# Patient Record
Sex: Female | Born: 1960 | Race: Black or African American | Hispanic: No | Smoking: Never smoker
Health system: Southern US, Community
[De-identification: ages and names within clinical notes are randomized; demographics above are authoritative.]

## PROBLEM LIST (undated history)

## (undated) DIAGNOSIS — R102 Pelvic and perineal pain: Secondary | ICD-10-CM

## (undated) DIAGNOSIS — J309 Allergic rhinitis, unspecified: Secondary | ICD-10-CM

## (undated) DIAGNOSIS — C801 Malignant (primary) neoplasm, unspecified: Secondary | ICD-10-CM

## (undated) DIAGNOSIS — K219 Gastro-esophageal reflux disease without esophagitis: Secondary | ICD-10-CM

## (undated) DIAGNOSIS — N83209 Unspecified ovarian cyst, unspecified side: Secondary | ICD-10-CM

## (undated) DIAGNOSIS — Z8719 Personal history of other diseases of the digestive system: Secondary | ICD-10-CM

## (undated) DIAGNOSIS — Z973 Presence of spectacles and contact lenses: Secondary | ICD-10-CM

## (undated) DIAGNOSIS — M5432 Sciatica, left side: Secondary | ICD-10-CM

## (undated) DIAGNOSIS — G4730 Sleep apnea, unspecified: Secondary | ICD-10-CM

## (undated) DIAGNOSIS — R7303 Prediabetes: Secondary | ICD-10-CM

## (undated) DIAGNOSIS — E041 Nontoxic single thyroid nodule: Secondary | ICD-10-CM

## (undated) HISTORY — PX: ABDOMINAL HYSTERECTOMY: SHX81

## (undated) HISTORY — DX: Nontoxic single thyroid nodule: E04.1

---
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
## 1996-03-07 HISTORY — PX: LAPAROSCOPIC CHOLECYSTECTOMY: SUR755

## 1999-02-19 ENCOUNTER — Encounter: Payer: Self-pay | Admitting: Emergency Medicine

## 1999-02-19 ENCOUNTER — Emergency Department (HOSPITAL_COMMUNITY): Admission: EM | Admit: 1999-02-19 | Discharge: 1999-02-19 | Payer: Self-pay | Admitting: Emergency Medicine

## 1999-06-17 ENCOUNTER — Other Ambulatory Visit: Admission: RE | Admit: 1999-06-17 | Discharge: 1999-06-17 | Payer: Self-pay | Admitting: Internal Medicine

## 1999-08-13 ENCOUNTER — Emergency Department (HOSPITAL_COMMUNITY): Admission: EM | Admit: 1999-08-13 | Discharge: 1999-08-14 | Payer: Self-pay | Admitting: Emergency Medicine

## 2000-06-06 ENCOUNTER — Other Ambulatory Visit: Admission: RE | Admit: 2000-06-06 | Discharge: 2000-06-06 | Payer: Self-pay | Admitting: Internal Medicine

## 2001-08-22 ENCOUNTER — Other Ambulatory Visit: Admission: RE | Admit: 2001-08-22 | Discharge: 2001-08-22 | Payer: Self-pay | Admitting: Internal Medicine

## 2001-12-22 ENCOUNTER — Emergency Department (HOSPITAL_COMMUNITY): Admission: EM | Admit: 2001-12-22 | Discharge: 2001-12-22 | Payer: Self-pay | Admitting: Emergency Medicine

## 2002-01-03 ENCOUNTER — Ambulatory Visit (HOSPITAL_COMMUNITY): Admission: RE | Admit: 2002-01-03 | Discharge: 2002-01-03 | Payer: Self-pay | Admitting: Internal Medicine

## 2002-01-03 ENCOUNTER — Encounter: Payer: Self-pay | Admitting: Internal Medicine

## 2002-07-10 ENCOUNTER — Other Ambulatory Visit: Admission: RE | Admit: 2002-07-10 | Discharge: 2002-07-10 | Payer: Self-pay | Admitting: Internal Medicine

## 2003-01-09 ENCOUNTER — Encounter: Admission: RE | Admit: 2003-01-09 | Discharge: 2003-01-09 | Payer: Self-pay | Admitting: Orthopaedic Surgery

## 2003-05-09 ENCOUNTER — Encounter: Admission: RE | Admit: 2003-05-09 | Discharge: 2003-05-09 | Payer: Self-pay | Admitting: Orthopaedic Surgery

## 2003-05-13 ENCOUNTER — Ambulatory Visit (HOSPITAL_BASED_OUTPATIENT_CLINIC_OR_DEPARTMENT_OTHER): Admission: RE | Admit: 2003-05-13 | Discharge: 2003-05-13 | Payer: Self-pay | Admitting: Orthopaedic Surgery

## 2003-05-13 HISTORY — PX: OTHER SURGICAL HISTORY: SHX169

## 2003-08-25 ENCOUNTER — Encounter: Admission: RE | Admit: 2003-08-25 | Discharge: 2003-08-25 | Payer: Self-pay | Admitting: Orthopaedic Surgery

## 2003-10-08 ENCOUNTER — Emergency Department (HOSPITAL_COMMUNITY): Admission: EM | Admit: 2003-10-08 | Discharge: 2003-10-08 | Payer: Self-pay

## 2003-10-22 ENCOUNTER — Other Ambulatory Visit: Admission: RE | Admit: 2003-10-22 | Discharge: 2003-10-22 | Payer: Self-pay | Admitting: Obstetrics and Gynecology

## 2003-10-31 ENCOUNTER — Ambulatory Visit (HOSPITAL_COMMUNITY): Admission: RE | Admit: 2003-10-31 | Discharge: 2003-10-31 | Payer: Self-pay | Admitting: Obstetrics and Gynecology

## 2004-02-18 ENCOUNTER — Ambulatory Visit (HOSPITAL_COMMUNITY): Admission: RE | Admit: 2004-02-18 | Discharge: 2004-02-18 | Payer: Self-pay | Admitting: Obstetrics and Gynecology

## 2004-02-18 ENCOUNTER — Encounter (INDEPENDENT_AMBULATORY_CARE_PROVIDER_SITE_OTHER): Payer: Self-pay | Admitting: Specialist

## 2004-02-18 HISTORY — PX: OTHER SURGICAL HISTORY: SHX169

## 2004-09-13 ENCOUNTER — Ambulatory Visit: Payer: Self-pay | Admitting: Internal Medicine

## 2004-09-15 ENCOUNTER — Ambulatory Visit: Payer: Self-pay | Admitting: Internal Medicine

## 2004-09-15 ENCOUNTER — Other Ambulatory Visit: Admission: RE | Admit: 2004-09-15 | Discharge: 2004-09-15 | Payer: Self-pay | Admitting: Internal Medicine

## 2005-01-19 ENCOUNTER — Emergency Department (HOSPITAL_COMMUNITY): Admission: EM | Admit: 2005-01-19 | Discharge: 2005-01-19 | Payer: Self-pay | Admitting: Emergency Medicine

## 2005-06-07 ENCOUNTER — Ambulatory Visit: Payer: Self-pay | Admitting: Internal Medicine

## 2005-10-12 ENCOUNTER — Ambulatory Visit: Payer: Self-pay | Admitting: Internal Medicine

## 2005-10-13 ENCOUNTER — Ambulatory Visit: Payer: Self-pay | Admitting: Internal Medicine

## 2006-06-21 ENCOUNTER — Ambulatory Visit: Payer: Self-pay | Admitting: Internal Medicine

## 2006-06-21 LAB — CONVERTED CEMR LAB
ALT: 16 units/L (ref 0–40)
AST: 16 units/L (ref 0–37)
Albumin: 3.8 g/dL (ref 3.5–5.2)
Alkaline Phosphatase: 67 units/L (ref 39–117)
BUN: 7 mg/dL (ref 6–23)
Basophils Absolute: 0 10*3/uL (ref 0.0–0.1)
Basophils Relative: 0.3 % (ref 0.0–1.0)
Bilirubin Urine: NEGATIVE
Bilirubin, Direct: 0.2 mg/dL (ref 0.0–0.3)
CO2: 29 meq/L (ref 19–32)
Calcium: 9 mg/dL (ref 8.4–10.5)
Chloride: 108 meq/L (ref 96–112)
Cholesterol: 177 mg/dL (ref 0–200)
Creatinine, Ser: 0.7 mg/dL (ref 0.4–1.2)
Eosinophils Absolute: 0.2 10*3/uL (ref 0.0–0.6)
Eosinophils Relative: 3 % (ref 0.0–5.0)
GFR calc Af Amer: 117 mL/min
GFR calc non Af Amer: 97 mL/min
Glucose, Bld: 95 mg/dL (ref 70–99)
HCT: 39.4 % (ref 36.0–46.0)
HDL: 61.5 mg/dL (ref >39.0)
Hemoglobin: 13.2 g/dL (ref 12.0–15.0)
Ketones, ur: NEGATIVE mg/dL
LDL Cholesterol: 94 mg/dL (ref 0–99)
Leukocytes, UA: NEGATIVE
Lymphocytes Relative: 28.6 % (ref 12.0–46.0)
MCHC: 33.6 g/dL (ref 30.0–36.0)
MCV: 82.6 fL (ref 78.0–100.0)
Monocytes Absolute: 0.4 10*3/uL (ref 0.2–0.7)
Monocytes Relative: 7.3 % (ref 3.0–11.0)
Neutro Abs: 3.5 10*3/uL (ref 1.4–7.7)
Neutrophils Relative %: 60.8 % (ref 43.0–77.0)
Nitrite: NEGATIVE
Platelets: 283 10*3/uL (ref 150–400)
Potassium: 3.9 meq/L (ref 3.5–5.1)
RBC: 4.77 M/uL (ref 3.87–5.11)
RDW: 12.8 % (ref 11.5–14.6)
Sodium: 142 meq/L (ref 135–145)
Specific Gravity, Urine: >=1.03 (ref 1.000–1.03)
TSH: 1.05 microintl units/mL (ref 0.35–5.50)
Total Bilirubin: 1.2 mg/dL (ref 0.3–1.2)
Total CHOL/HDL Ratio: 2.9
Total Protein, Urine: NEGATIVE mg/dL
Total Protein: 6.4 g/dL (ref 6.0–8.3)
Triglycerides: 106 mg/dL (ref 0–149)
Urine Glucose: NEGATIVE mg/dL
Urobilinogen, UA: 0.2 (ref 0.0–1.0)
VLDL: 21 mg/dL (ref 0–40)
WBC: 5.8 10*3/uL (ref 4.5–10.5)
pH: 5.5 (ref 5.0–8.0)

## 2006-06-28 ENCOUNTER — Ambulatory Visit: Payer: Self-pay | Admitting: Internal Medicine

## 2006-07-04 ENCOUNTER — Encounter: Admission: RE | Admit: 2006-07-04 | Discharge: 2006-07-04 | Payer: Self-pay | Admitting: Internal Medicine

## 2007-05-09 ENCOUNTER — Telehealth: Payer: Self-pay | Admitting: Internal Medicine

## 2007-06-26 ENCOUNTER — Ambulatory Visit: Payer: Self-pay | Admitting: Internal Medicine

## 2007-06-26 LAB — CONVERTED CEMR LAB
ALT: 15 units/L (ref 0–35)
AST: 15 units/L (ref 0–37)
Albumin: 3.9 g/dL (ref 3.5–5.2)
Alkaline Phosphatase: 67 units/L (ref 39–117)
BUN: 4 mg/dL — ABNORMAL LOW (ref 6–23)
Basophils Absolute: 0 10*3/uL (ref 0.0–0.1)
Basophils Relative: 0.1 % (ref 0.0–1.0)
Bilirubin Urine: NEGATIVE
Bilirubin, Direct: 0.1 mg/dL (ref 0.0–0.3)
CO2: 30 meq/L (ref 19–32)
Calcium: 9.1 mg/dL (ref 8.4–10.5)
Chloride: 109 meq/L (ref 96–112)
Cholesterol: 202 mg/dL (ref 0–200)
Creatinine, Ser: 0.7 mg/dL (ref 0.4–1.2)
Crystals: NEGATIVE
Direct LDL: 112.8 mg/dL
Eosinophils Absolute: 0.2 10*3/uL (ref 0.0–0.7)
Eosinophils Relative: 3.1 % (ref 0.0–5.0)
GFR calc Af Amer: 117 mL/min
GFR calc non Af Amer: 97 mL/min
Glucose, Bld: 94 mg/dL (ref 70–99)
HCT: 41.6 % (ref 36.0–46.0)
HDL: 76.1 mg/dL (ref >39.0)
Hemoglobin: 13.7 g/dL (ref 12.0–15.0)
Ketones, ur: NEGATIVE mg/dL
Lymphocytes Relative: 24.4 % (ref 12.0–46.0)
MCHC: 32.9 g/dL (ref 30.0–36.0)
MCV: 84.4 fL (ref 78.0–100.0)
Monocytes Absolute: 0.4 10*3/uL (ref 0.1–1.0)
Monocytes Relative: 6.4 % (ref 3.0–12.0)
Mucus, UA: NEGATIVE
Neutro Abs: 4.1 10*3/uL (ref 1.4–7.7)
Neutrophils Relative %: 66 % (ref 43.0–77.0)
Nitrite: POSITIVE — AB
Platelets: 296 10*3/uL (ref 150–400)
Potassium: 3.9 meq/L (ref 3.5–5.1)
RBC: 4.93 M/uL (ref 3.87–5.11)
RDW: 13 % (ref 11.5–14.6)
Sodium: 141 meq/L (ref 135–145)
Specific Gravity, Urine: 1.025 (ref 1.000–1.03)
TSH: 1.38 microintl units/mL (ref 0.35–5.50)
Total Bilirubin: 1.1 mg/dL (ref 0.3–1.2)
Total CHOL/HDL Ratio: 2.7
Total Protein, Urine: NEGATIVE mg/dL
Total Protein: 6.6 g/dL (ref 6.0–8.3)
Triglycerides: 43 mg/dL (ref 0–149)
Urine Glucose: NEGATIVE mg/dL
Urobilinogen, UA: 0.2 (ref 0.0–1.0)
VLDL: 9 mg/dL (ref 0–40)
WBC: 6.2 10*3/uL (ref 4.5–10.5)
pH: 6 (ref 5.0–8.0)

## 2007-07-03 ENCOUNTER — Ambulatory Visit: Payer: Self-pay | Admitting: Internal Medicine

## 2007-07-03 DIAGNOSIS — F329 Major depressive disorder, single episode, unspecified: Secondary | ICD-10-CM

## 2007-07-03 DIAGNOSIS — J309 Allergic rhinitis, unspecified: Secondary | ICD-10-CM | POA: Insufficient documentation

## 2007-07-03 DIAGNOSIS — F411 Generalized anxiety disorder: Secondary | ICD-10-CM | POA: Insufficient documentation

## 2007-09-17 ENCOUNTER — Telehealth: Payer: Self-pay | Admitting: Internal Medicine

## 2008-02-03 ENCOUNTER — Emergency Department (HOSPITAL_COMMUNITY): Admission: EM | Admit: 2008-02-03 | Discharge: 2008-02-03 | Payer: Self-pay | Admitting: Emergency Medicine

## 2008-08-05 ENCOUNTER — Ambulatory Visit: Payer: Self-pay | Admitting: Internal Medicine

## 2008-08-05 LAB — CONVERTED CEMR LAB
ALT: 14 units/L (ref 0–35)
AST: 14 units/L (ref 0–37)
Albumin: 3.8 g/dL (ref 3.5–5.2)
Alkaline Phosphatase: 68 units/L (ref 39–117)
BUN: 8 mg/dL (ref 6–23)
Basophils Absolute: 0 10*3/uL (ref 0.0–0.1)
Basophils Relative: 0.3 % (ref 0.0–3.0)
Bilirubin Urine: NEGATIVE
Bilirubin, Direct: 0.2 mg/dL (ref 0.0–0.3)
CO2: 29 meq/L (ref 19–32)
Calcium: 9.1 mg/dL (ref 8.4–10.5)
Chloride: 109 meq/L (ref 96–112)
Cholesterol: 172 mg/dL (ref 0–200)
Creatinine, Ser: 0.7 mg/dL (ref 0.4–1.2)
Eosinophils Absolute: 0.1 10*3/uL (ref 0.0–0.7)
Eosinophils Relative: 2.1 % (ref 0.0–5.0)
GFR calc non Af Amer: 116.09 mL/min (ref >60)
Glucose, Bld: 92 mg/dL (ref 70–99)
HCT: 39.2 % (ref 36.0–46.0)
HDL: 73.6 mg/dL (ref >39.00)
Hemoglobin, Urine: NEGATIVE
Hemoglobin: 13.4 g/dL (ref 12.0–15.0)
Ketones, ur: NEGATIVE mg/dL
LDL Cholesterol: 92 mg/dL (ref 0–99)
Leukocytes, UA: NEGATIVE
Lymphocytes Relative: 33.6 % (ref 12.0–46.0)
Lymphs Abs: 1.7 10*3/uL (ref 0.7–4.0)
MCHC: 34.3 g/dL (ref 30.0–36.0)
MCV: 85.3 fL (ref 78.0–100.0)
Monocytes Absolute: 0.4 10*3/uL (ref 0.1–1.0)
Monocytes Relative: 8.5 % (ref 3.0–12.0)
Neutro Abs: 2.9 10*3/uL (ref 1.4–7.7)
Neutrophils Relative %: 55.5 % (ref 43.0–77.0)
Nitrite: POSITIVE
Platelets: 240 10*3/uL (ref 150.0–400.0)
Potassium: 4.1 meq/L (ref 3.5–5.1)
RBC: 4.59 M/uL (ref 3.87–5.11)
RDW: 13.2 % (ref 11.5–14.6)
Sodium: 142 meq/L (ref 135–145)
Specific Gravity, Urine: 1.02 (ref 1.000–1.030)
TSH: 1.26 microintl units/mL (ref 0.35–5.50)
Total Bilirubin: 1.1 mg/dL (ref 0.3–1.2)
Total CHOL/HDL Ratio: 2
Total Protein, Urine: NEGATIVE mg/dL
Total Protein: 6.7 g/dL (ref 6.0–8.3)
Triglycerides: 33 mg/dL (ref 0.0–149.0)
Urine Glucose: NEGATIVE mg/dL
Urobilinogen, UA: 0.2 (ref 0.0–1.0)
VLDL: 6.6 mg/dL (ref 0.0–40.0)
WBC: 5.1 10*3/uL (ref 4.5–10.5)
pH: 5.5 (ref 5.0–8.0)

## 2008-08-18 ENCOUNTER — Ambulatory Visit: Payer: Self-pay | Admitting: Internal Medicine

## 2008-10-30 ENCOUNTER — Telehealth: Payer: Self-pay | Admitting: Internal Medicine

## 2008-10-30 DIAGNOSIS — N62 Hypertrophy of breast: Secondary | ICD-10-CM

## 2008-10-30 DIAGNOSIS — M549 Dorsalgia, unspecified: Secondary | ICD-10-CM | POA: Insufficient documentation

## 2008-11-06 ENCOUNTER — Encounter: Payer: Self-pay | Admitting: Internal Medicine

## 2008-11-06 ENCOUNTER — Telehealth: Payer: Self-pay | Admitting: Internal Medicine

## 2008-12-11 ENCOUNTER — Telehealth: Payer: Self-pay | Admitting: Internal Medicine

## 2008-12-16 ENCOUNTER — Encounter (INDEPENDENT_AMBULATORY_CARE_PROVIDER_SITE_OTHER): Payer: Self-pay | Admitting: Plastic Surgery

## 2008-12-16 ENCOUNTER — Ambulatory Visit (HOSPITAL_BASED_OUTPATIENT_CLINIC_OR_DEPARTMENT_OTHER): Admission: RE | Admit: 2008-12-16 | Discharge: 2008-12-16 | Payer: Self-pay | Admitting: Plastic Surgery

## 2008-12-16 HISTORY — PX: BREAST REDUCTION SURGERY: SHX8

## 2009-05-26 ENCOUNTER — Ambulatory Visit: Payer: Self-pay | Admitting: Internal Medicine

## 2009-05-26 DIAGNOSIS — M25569 Pain in unspecified knee: Secondary | ICD-10-CM

## 2009-05-27 ENCOUNTER — Encounter: Payer: Self-pay | Admitting: Internal Medicine

## 2009-05-30 ENCOUNTER — Ambulatory Visit (HOSPITAL_COMMUNITY): Admission: RE | Admit: 2009-05-30 | Discharge: 2009-05-30 | Payer: Self-pay | Admitting: Internal Medicine

## 2009-06-08 ENCOUNTER — Telehealth: Payer: Self-pay | Admitting: Internal Medicine

## 2009-07-07 ENCOUNTER — Inpatient Hospital Stay (HOSPITAL_COMMUNITY): Admission: EM | Admit: 2009-07-07 | Discharge: 2009-07-10 | Payer: Self-pay | Admitting: Emergency Medicine

## 2009-07-07 ENCOUNTER — Encounter (INDEPENDENT_AMBULATORY_CARE_PROVIDER_SITE_OTHER): Payer: Self-pay | Admitting: Internal Medicine

## 2009-07-10 ENCOUNTER — Encounter: Payer: Self-pay | Admitting: Internal Medicine

## 2009-07-24 ENCOUNTER — Ambulatory Visit: Payer: Self-pay | Admitting: Pulmonary Disease

## 2009-07-27 ENCOUNTER — Encounter: Payer: Self-pay | Admitting: Pulmonary Disease

## 2009-07-27 ENCOUNTER — Ambulatory Visit (HOSPITAL_BASED_OUTPATIENT_CLINIC_OR_DEPARTMENT_OTHER): Admission: RE | Admit: 2009-07-27 | Discharge: 2009-07-27 | Payer: Self-pay | Admitting: Pulmonary Disease

## 2009-07-31 ENCOUNTER — Ambulatory Visit: Payer: Self-pay | Admitting: Pulmonary Disease

## 2009-08-19 ENCOUNTER — Ambulatory Visit: Payer: Self-pay | Admitting: Pulmonary Disease

## 2009-08-19 DIAGNOSIS — G4733 Obstructive sleep apnea (adult) (pediatric): Secondary | ICD-10-CM

## 2009-08-31 ENCOUNTER — Ambulatory Visit: Payer: Self-pay | Admitting: Internal Medicine

## 2009-08-31 DIAGNOSIS — K219 Gastro-esophageal reflux disease without esophagitis: Secondary | ICD-10-CM

## 2010-03-28 ENCOUNTER — Encounter: Payer: Self-pay | Admitting: Obstetrics and Gynecology

## 2010-04-06 NOTE — Progress Notes (Signed)
  Phone Note Refill Request  on June 08, 2009 8:46 AM  Refills Requested: Medication #1:  ALPRAZOLAM 0.5 MG  TABS 1 by mouth once daily as needed   Dosage confirmed as above?Dosage Confirmed   Notes: [facility] [address] [phone] Initial call taken by: Scharlene Gloss,  June 08, 2009 8:46 AM  Follow-up for Phone Call        Rx faxed to pharmacy Follow-up by: Margaret Pyle, CMA,  June 08, 2009 10:10 AM    New/Updated Medications: ALPRAZOLAM 0.5 MG  TABS (ALPRAZOLAM) 1 by mouth once daily as needed Prescriptions: ALPRAZOLAM 0.5 MG  TABS (ALPRAZOLAM) 1 by mouth once daily as needed  #30 x 2   Entered and Authorized by:   Corwin Levins MD   Signed by:   Corwin Levins MD on 06/08/2009   Method used:   Print then Give to Patient   RxID:   [id number]  done hardcopy to LIM side B - dahlia  Corwin Levins MD  June 08, 2009 9:03 AM

## 2010-04-06 NOTE — Assessment & Plan Note (Signed)
Summary: f/u sleep study ///kp   Visit Type:  Follow-up Copy to:  Dr. Berkley Harvey Primary Provider/Referring Provider:  Corwin Levins MD  CC:  Sleep study follow-up.Marland Kitchen  History of Present Illness: 50 yo female for sleep study follow up.   This was done on Jul 27, 2009 and showed mild OSA with AHI 2 RDI 5 with a sgnificant REM and positional affect.  She has been working on getting her weight down.  She has been getting problems with her knees, and this has limited her ability to exercise.  She is trying to adjust her diet.  She was on several weight loss therapies before, and had some success with these.  Unfortunately, the results were short lived.     Current Medications (verified): 1)  Diazepam 5 Mg Tabs (Diazepam) .... Take 1 Tablet By Mouth At Bedtime As Needed 2)  Px Enteric Aspirin 325 Mg Tbec (Aspirin) .Marland Kitchen.. 1 Tablet Every Day 3)  Herbalife Total Control .... 2 Times Daily 4)  Herbalife Thermo Bond .Marland Kitchen.. 6 Times  Day  Allergies (verified): 1)  ! * Bactim  Past History:  Past Surgical History: Last updated: 07/24/2009 Cholecystectomy s/p c-section s/p breast cyst 1980 Right elbow ulnar nerve decompression 2005 Lysis of adhesion and right ovary removal 2005 Bilateral reduction mammoplasty 2010  Past Medical History: right thyroid nodule Anxiety Depression Allergic rhinitis Mild OSA      - Jul 27, 2009 PSG RDI 5, positional and REM affect.  Vital Signs:  Patient profile:   50 year old female Height:      63 inches (160.02 cm) Weight:      189 pounds (85.91 kg) BMI:     33.60 O2 Sat:      98 % on Room air Temp:     98.2 degrees F (36.78 degrees C) oral Pulse rate:   72 / minute BP sitting:   130 / 86  (left arm) Cuff size:   large  Vitals Entered By: Michel Bickers CMA (August 19, 2009 4:35 PM)  O2 Sat at Rest %:  98 O2 Flow:  Room air CC: Sleep study follow-up. Comments Medications reviewed. Daytime phone verified. Michel Bickers CMA  August 19, 2009 4:45  PM   Physical Exam  General:  normal appearance, healthy appearing, and obese.   Nose:  no deformity, discharge, inflammation, or lesions Mouth:  MP 4, enlarged tongue, 2+ tonsills Neck:  no JVD.   Lungs:  clear bilaterally to auscultation and percussion Heart:  regular rate and rhythm, S1, S2 without murmurs, rubs, gallops, or clicks Extremities:  no clubbing, cyanosis, edema, or deformity noted Cervical Nodes:  no significant adenopathy   Impression & Recommendations:  Problem # 1:  OBSTRUCTIVE SLEEP APNEA (ICD-327.23)  She has mild sleep apnea with a positional and REM affect.  I reviewed her sleep results with her.  Explained how sleep apnea can affect her health.  Driving precautions were discussed.  Reviewed treatment options for her sleep apnea.  Advised her to try using positional therapy.  Strongly emphasized the need for improvement in her weight.  Reviewed in detail interventions to achieve weight reduction.  She wants to try pharmaceutical assistance for her weight loss.  I have advised her to discuss this further with her primary care physician.  Explained that her sleep apnea can get worse as she gets older, especially if she is unsuccessful in losing weight.  She is to monitor her symptoms, and will then decide if specific therapy  is needed for her sleep apnea.  Medications Added to Medication List This Visit: 1)  Diazepam 5 Mg Tabs (Diazepam) .... Take 1 tablet by mouth at bedtime as needed  Complete Medication List: 1)  Diazepam 5 Mg Tabs (Diazepam) .... Take 1 tablet by mouth at bedtime as needed 2)  Px Enteric Aspirin 325 Mg Tbec (Aspirin) .Marland Kitchen.. 1 tablet every day 3)  Herbalife Total Control  .... 2 times daily 4)  Herbalife Thermo Bond  .Marland Kitchen.. 6 times  day  Other Orders: Est. Patient Level III (36644)  Patient Instructions: 1)  Follow up as needed

## 2010-04-06 NOTE — Assessment & Plan Note (Signed)
Summary: R KNEE PAIN  STC   Vital Signs:  Patient profile:   50 year old female Height:      63 inches Weight:      191.13 pounds BMI:     33.98 O2 Sat:      99 % on Room air Temp:     98.1 degrees F oral Pulse rate:   67 / minute BP sitting:   110 / 66  (left arm) Cuff size:   regular  Vitals Entered ByZella Ball Ewing (May 26, 2009 4:18 PM)  O2 Flow:  Room air CC: Right knee pain, retaining fluid/RE   CC:  Right knee pain and retaining fluid/RE.  History of Present Illness: here with trying to follow better health recommendations as per past visits with trying to be more active especially with walking more;  trying to walk at least 5 times per wk but has not been able to in the past wk nearly as much as she has suffered injury it seems to right knee with pain, swelling increasing daily without fever or fall or other trauma.  Knee pain approx 8/10, worse with ambulation, large swelling and click wtih ROM laterally, and locked once or twice.  Stress levels higher with pain, but she denies worsening depressive symptoms, suicidal ideaiton, or panic.  Good tolerance and med compliance.    Problems Prior to Update: 1)  Knee Pain, Right  (ICD-719.46) 2)  Breast Hypertrophy  (ICD-611.1) 3)  Back Pain  (ICD-724.5) 4)  Preventive Health Care  (ICD-V70.0) 5)  Allergic Rhinitis  (ICD-477.9) 6)  Depression  (ICD-311) 7)  Anxiety  (ICD-300.00)  Medications Prior to Update: 1)  Alprazolam 0.5 Mg  Tabs (Alprazolam) .Marland Kitchen.. 1 By Mouth Once Daily As Needed 2)  Bupropion Hcl 300 Mg Xr24h-Tab (Bupropion Hcl) .Marland Kitchen.. 1po Once Daily  Current Medications (verified): 1)  Alprazolam 0.5 Mg  Tabs (Alprazolam) .Marland Kitchen.. 1 By Mouth Once Daily As Needed 2)  Bupropion Hcl 300 Mg Xr24h-Tab (Bupropion Hcl) .Marland Kitchen.. 1po Once Daily 3)  Oxycodone Hcl 5 Mg Caps (Oxycodone Hcl) .Marland Kitchen.. 1 - 2 By Mouth Q 6 Hrs As Needed Pain  Allergies (verified): 1)  ! * Bactim  Past History:  Past Medical History: Last updated:  07/03/2007 right thyroid nodule Anxiety Depression Allergic rhinitis  Past Surgical History: Last updated: 07/03/2007 Cholecystectomy s/p c-section s/p breast cyst 1980  Social History: Last updated: 08/18/2008 Married/separted Never Smoked Alcohol use-no 1 son 16yo work - Passenger transport manager  - [employer] - [employer]  Risk Factors: Smoking Status: never (07/03/2007)  Review of Systems       all otherwise negative per pt -    Physical Exam  General:  alert and overweight-appearing.   Head:  normocephalic and atraumatic.   Eyes:  vision grossly intact, pupils equal, and pupils round.   Ears:  R ear normal and L ear normal.   Nose:  no external deformity and no nasal discharge.   Mouth:  no gingival abnormalities and pharynx pink and moist.   Neck:  supple and no masses.   Lungs:  normal respiratory effort and normal breath sounds.   Heart:  normal rate and regular rhythm.   Msk:  right knee with 3+ effusion, decreased ROM, click on ROM testing lateraly Extremities:  no edema, no erythema except for 2+ RLE swelling below the knee without calf tender or erythema   Impression & Recommendations:  Problem # 1:  KNEE PAIN, RIGHT (ICD-719.46)  severe  with large effusion, lateral click, subjective locking, and marked sympathetic LE edema prob related = prob cartilage/meniscus tear -   for MRI right knee, and refer ortho, pain control  Her updated medication list for this problem includes:    Oxycodone Hcl 5 Mg Caps (Oxycodone hcl) .Marland Kitchen... 1 - 2 by mouth q 6 hrs as needed pain  Orders: Radiology Referral (Radiology) Orthopedic Surgeon Referral (Ortho Surgeon)  Problem # 2:  ANXIETY (ICD-300.00)  Her updated medication list for this problem includes:    Alprazolam 0.5 Mg Tabs (Alprazolam) .Marland Kitchen... 1 by mouth once daily as needed    Bupropion Hcl 300 Mg Xr24h-tab (Bupropion hcl) .Marland Kitchen... 1po once daily stable overall by hx and exam, ok to continue meds/tx as is    Complete Medication List: 1)  Alprazolam 0.5 Mg Tabs (Alprazolam) .Marland Kitchen.. 1 by mouth once daily as needed 2)  Bupropion Hcl 300 Mg Xr24h-tab (Bupropion hcl) .Marland Kitchen.. 1po once daily 3)  Oxycodone Hcl 5 Mg Caps (Oxycodone hcl) .Marland Kitchen.. 1 - 2 by mouth q 6 hrs as needed pain  Patient Instructions: 1)  Please take all new medications as prescribed 2)  Continue all previous medications as before this visit  3)  You will be contacted about the referral(s) to: MRI for the knee, and Orthopedic (Dr Thurston Hole  [facility] from [facility]) 4)  Please schedule a follow-up appointment in 4 months with CPX labs Prescriptions: OXYCODONE HCL 5 MG CAPS (OXYCODONE HCL) 1 - 2 by mouth q 6 hrs as needed pain  #100 x 0   Entered and Authorized by:   Corwin Levins MD   Signed by:   Corwin Levins MD on 05/26/2009   Method used:   Print then Give to Patient   RxID:   [id number]

## 2010-04-06 NOTE — Assessment & Plan Note (Signed)
Summary: cpx/bcbs/#/cd   Vital Signs:  Patient profile:   50 year old female Height:      62 inches Weight:      188 pounds BMI:     34.51 O2 Sat:      95 % on Room air Temp:     98.3 degrees F oral Pulse rate:   74 / minute BP sitting:   134 / 84  (left arm) Cuff size:   regular  Vitals Entered By: Zella Ball Ewing CMA Duncan Dull) (August 31, 2009 1:21 PM)  O2 Flow:  Room air  CC: Adult Physical/RE   Primary Care Provider:  Corwin Levins MD  CC:  Adult Physical/RE.  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  No new complaints, does have ongoing recurrent hoarseness and reflux for which she saw ENT previously but did not continue the recommended PPI.  No abd pain, dysphagia, wt loss, change in bowels or blood or fever  Problems Prior to Update: 1)  Gerd  (ICD-530.81) 2)  Obstructive Sleep Apnea  (ICD-327.23) 3)  Knee Pain, Right  (ICD-719.46) 4)  Breast Hypertrophy  (ICD-611.1) 5)  Back Pain  (ICD-724.5) 6)  Preventive Health Care  (ICD-V70.0) 7)  Allergic Rhinitis  (ICD-477.9) 8)  Depression  (ICD-311) 9)  Anxiety  (ICD-300.00)  Medications Prior to Update: 1)  Diazepam 5 Mg Tabs (Diazepam) .... Take 1 Tablet By Mouth At Bedtime As Needed 2)  Px Enteric Aspirin 325 Mg Tbec (Aspirin) .Marland Kitchen.. 1 Tablet Every Day 3)  Herbalife Total Control .... 2 Times Daily 4)  Herbalife Thermo Bond .Marland Kitchen.. 6 Times  Day  Current Medications (verified): 1)  Diazepam 5 Mg Tabs (Diazepam) .... Take 1 Tablet By Mouth At Bedtime As Needed 2)  Px Enteric Aspirin 325 Mg Tbec (Aspirin) .Marland Kitchen.. 1 Tablet Every Day 3)  Herbalife Total Control .... 2 Times Daily 4)  Herbalife Thermo Bond .Marland Kitchen.. 6  Times  Day 5)  Omeprazole 20 Mg Cpdr (Omeprazole) .... 2po Once Daily  Allergies (verified): 1)  ! 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 GERD   Review of Systems  The patient denies anorexia, fever, weight loss, weight gain, vision loss, decreased hearing, chest pain, syncope, dyspnea on exertion, peripheral edema, prolonged cough, headaches, hemoptysis, abdominal pain, melena, hematochezia, severe indigestion/heartburn, hematuria, muscle weakness, suspicious skin lesions, transient blindness, difficulty walking, unusual weight change, abnormal bleeding, enlarged lymph nodes, and angioedema.         all otherwise negative per pt -    Physical Exam  General:  alert and overweight-appearing.  Head:  normocephalic and atraumatic.   Eyes:  vision grossly intact, pupils equal, and pupils round.   Ears:  R ear normal and L ear normal.   Nose:  no external deformity and no nasal discharge.   Mouth:  no gingival abnormalities and pharynx pink and moist.   Neck:  supple and no masses.   Lungs:  normal respiratory effort and normal breath sounds.   Heart:  normal rate and regular rhythm.   Abdomen:  soft, non-tender, and normal bowel sounds.   Msk:  no joint tenderness and no joint swelling.   Extremities:  no edema, no  erythema  Neurologic:  cranial nerves II-XII intact and strength normal in all extremities.     Impression & Recommendations:  Problem # 1:  Preventive Health Care (ICD-V70.0) Overall doing well, age appropriate education and counseling updated and referral for appropriate preventive services done unless declined, immunizations up to date or declined, diet counseling done if overweight, urged to quit smoking if smokes , most recent labs reviewed and current ordered if appropriate, ecg reviewed or declined (interpretation per ECG scanned in the EMR if done); information regarding Medicare Prevention requirements given if appropriate; speciality referrals updated as appropriate   Problem # 2:  GERD (ICD-530.81)  Her updated medication list for this problem includes:    Omeprazole 20 Mg Cpdr (Omeprazole) .Marland Kitchen... 2po once daily treat as above, f/u any worsening signs or symptoms   Complete Medication List: 1)  Diazepam 5 Mg Tabs (Diazepam) .... Take 1 tablet by mouth at bedtime as needed 2)  Px Enteric Aspirin 325 Mg Tbec (Aspirin) .Marland Kitchen.. 1 tablet every day 3)  Herbalife Total Control  .... 2 times daily 4)  Herbalife Thermo Bond  .Marland Kitchen.. 6 times  day 5)  Omeprazole 20 Mg Cpdr (Omeprazole) .... 2po once daily  Patient Instructions: 1)  Please take all new medications as prescribed 2)  Continue all previous medications as before this visit  3)  Please schedule a follow-up appointment in 1 year or sooner if needed Prescriptions: OMEPRAZOLE 20 MG CPDR (OMEPRAZOLE) 2po once daily  #60 x 11   Entered and Authorized by:   Corwin Levins MD   Signed by:   Corwin Levins MD on 08/31/2009   Method used:   Print then Give to Patient   RxID:   [id number]

## 2010-04-06 NOTE — Miscellaneous (Signed)
Summary: Polysomnogram report  Clinical Lists Changes Mild OSA with AHI 2 RDI 5.  Significant REM and positional affect.  Will have my nurse call to schedule next available ROV to review.  Appended Document: Polysomnogram report LMOMTCB.  Appended Document: Polysomnogram report LMOMTCB.  Appended Document: Polysomnogram report Pt has already scheduled ov with VS for June 15 at 430 with the schedulers.  Pt aware of this ov date/time/location.

## 2010-04-06 NOTE — Assessment & Plan Note (Signed)
Summary: sleep study/klw   Visit Type:  Initial Consult Copy to:  Dr. Berkley Harvey Primary Provider/Referring Provider:  Corwin Levins MD  CC:  Sleep consult.  Pt states she is restless in the AM, not able to sleep straight through the night, up multiple times, and sleeps better with sedation.  History of Present Illness: 50 yo female for sleep evaluation.  She was hospitalized at the beginning of May with a possible TIA.  During her hospitalization she was noted to have problems with her sleep, and there was concern for sleep apnea.  As a result a sleep consultation was arranged.  She has been noticing trouble with her sleep for years.  She feels lethargic and tired all the time.  She goes to bed at 10pm, and falls asleep quickly.  She will often fall asleep before watching TV.  She does snore, and wakes up with a choking sensation.  She wakes up several times to use the bathroom.  She gets out of bed at 7 am.  She will get headaches in the morning.  She does talk in her sleep.  She denies sleep walking, nightmares, or bruxism.  She gets occasional leg symptoms.  She denies sleep hallucinations, sleep paralysis, or cataplexy.  Her weight has been steady.  She denies thyroid disease.  She drinks one cup of coffee in the morning.  She has been using valium at night to help her sleep since she left the hospital.  She tried using xanax before.  She does not drink much alcohol.  Epworth score is 13 out of 24.  Current Medications (verified): 1)  Simvastatin 20 Mg Tabs (Simvastatin) .Marland Kitchen.. 1 Tablet By Mouth At Bedtime 2)  Alprazolam 0.5 Mg Tabs (Alprazolam) .... Take 1 Tablet Every Day As Needed 3)  Diazepam 5 Mg Tabs (Diazepam) .... Take 1 Tablet By Mouth At Bedtime 4)  Budeprion Xl 300 Mg Xr24h-Tab (Bupropion Hcl) .Marland Kitchen.. 1 Tab Evry Day 5)  Px Enteric Aspirin 325 Mg Tbec (Aspirin) .Marland Kitchen.. 1 Tablet Every Day 6)  Herbalife Total Control .... 2 Times Daily 7)  Herbalife Thermo Bond .Marland Kitchen.. 6 Times   Day  Allergies (verified): 1)  ! * Bactim  Past History:  Past Surgical History: Cholecystectomy s/p c-section s/p breast cyst 1980 Right elbow ulnar nerve decompression 2005 Lysis of adhesion and right ovary removal 2005 Bilateral reduction mammoplasty 2010  Family History: Reviewed history from 07/03/2007 and no changes required. DM HTN sister-in -law with ovary cancer  Social History: Reviewed history from 08/18/2008 and no changes required. Married/separted Never Smoked Alcohol use-no 1 son 16yo work - Passenger transport manager  - Copywriter, advertising - Public affairs consultant  Review of Systems       The patient complains of non-productive cough, chest pain, anxiety, and depression.  The patient denies shortness of breath with activity, shortness of breath at rest, productive cough, coughing up blood, irregular heartbeats, acid heartburn, indigestion, loss of appetite, weight change, abdominal pain, difficulty swallowing, sore throat, tooth/dental problems, headaches, nasal congestion/difficulty breathing through nose, sneezing, itching, ear ache, hand/feet swelling, joint stiffness or pain, rash, change in color of mucus, and fever.    Vital Signs:  Patient profile:   50 year old female Height:      63 inches Weight:      192 pounds O2 Sat:      100 % on Room air Temp:     97.6 degrees F oral Pulse rate:   68 / minute BP sitting:  120 / 72  (left arm) Cuff size:   regular  Vitals Entered By: Carver Fila SMA(Jul 24, 2009 9:06 AM)  O2 Flow:  Room air CC: Sleep consult.  Pt states she is restless in the AM, not able to sleep straight through the night, up multiple times, sleeps better with sedation Comments Meds and allergies updated Carver Fila SMA  Jul 24, 2009 9:06 AM  Daytime phone number verified with patient.    Physical Exam  General:  normal appearance, healthy appearing, and obese.   Eyes:  PERRLA and EOMI.   Nose:  no deformity, discharge, inflammation, or  lesions Mouth:  MP 4, enlarged tongue, 2+ tonsills Neck:  no JVD.   Chest Wall:  no deformities noted Lungs:  clear bilaterally to auscultation and percussion Heart:  regular rate and rhythm, S1, S2 without murmurs, rubs, gallops, or clicks Abdomen:  bowel sounds positive; abdomen soft and non-tender without masses, or organomegaly Msk:  no deformity or scoliosis noted with normal posture Pulses:  pulses normal Extremities:  no clubbing, cyanosis, edema, or deformity noted Neurologic:  CN II-XII grossly intact with normal reflexes, coordination, muscle strength and tone Cervical Nodes:  no significant adenopathy Psych:  depressed affect.     Impression & Recommendations:  Problem # 1:  HYPERSOMNIA (ICD-780.54) She has symptoms suggestive of sleep apnea.  She had a recent TIA.  To further assess will arrange for sleep test.  I have explained the diagnosis of sleep apnea.  Explained how sleep apnea can affect her health.  Driving precautions, and benefit of weight reduction were reviewed.  Problem # 2:  ANXIETY (ICD-300.00) She has been using valium for anxiety and to help sleep since her hospital discharge.  Advised her to follow up with primary care to further assess this.  Explained how benzodiazepine medications could affect her breathing if she has sleep apnea.  Medications Added to Medication List This Visit: 1)  Simvastatin 20 Mg Tabs (Simvastatin) .Marland Kitchen.. 1 tablet by mouth at bedtime 2)  Alprazolam 0.5 Mg Tabs (Alprazolam) .... Take 1 tablet every day as needed 3)  Diazepam 5 Mg Tabs (Diazepam) .... Take 1 tablet by mouth at bedtime 4)  Budeprion Xl 300 Mg Xr24h-tab (Bupropion hcl) .Marland Kitchen.. 1 tab evry day 5)  Px Enteric Aspirin 325 Mg Tbec (Aspirin) .Marland Kitchen.. 1 tablet every day 6)  Herbalife Total Control  .... 2 times daily 7)  Herbalife Thermo Bond  .Marland Kitchen.. 6 times  day  Complete Medication List: 1)  Simvastatin 20 Mg Tabs (Simvastatin) .Marland Kitchen.. 1 tablet by mouth at bedtime 2)  Alprazolam  0.5 Mg Tabs (Alprazolam) .... Take 1 tablet every day as needed 3)  Diazepam 5 Mg Tabs (Diazepam) .... Take 1 tablet by mouth at bedtime 4)  Px Enteric Aspirin 325 Mg Tbec (Aspirin) .Marland Kitchen.. 1 tablet every day 5)  Herbalife Total Control  .... 2 times daily 6)  Herbalife Thermo Bond  .Marland Kitchen.. 6 times  day  Other Orders: Consultation Level IV (16109) Sleep Disorder Referral (Sleep Disorder)  Patient Instructions: 1)  Will schedule sleep test 2)  Will call to schedule follow up after sleep test is reviewed

## 2010-05-25 LAB — COMPREHENSIVE METABOLIC PANEL
ALT: 22 U/L (ref 0–35)
AST: 25 U/L (ref 0–37)
Albumin: 3.6 g/dL (ref 3.5–5.2)
Albumin: 4.1 g/dL (ref 3.5–5.2)
Alkaline Phosphatase: 54 U/L (ref 39–117)
BUN: 5 mg/dL — ABNORMAL LOW (ref 6–23)
BUN: 9 mg/dL (ref 6–23)
BUN: 9 mg/dL (ref 6–23)
CO2: 20 mEq/L (ref 19–32)
Calcium: 9.2 mg/dL (ref 8.4–10.5)
Calcium: 9.1 mg/dL (ref 8.4–10.5)
Chloride: 109 mEq/L (ref 96–112)
Chloride: 109 mEq/L (ref 96–112)
Chloride: 109 mEq/L (ref 96–112)
Creatinine, Ser: 0.74 mg/dL (ref 0.4–1.2)
Creatinine, Ser: 0.95 mg/dL (ref 0.4–1.2)
GFR calc Af Amer: >60 mL/min (ref >60)
GFR calc Af Amer: >60 mL/min (ref >60)
GFR calc non Af Amer: >60 mL/min (ref >60)
Glucose, Bld: 117 mg/dL — ABNORMAL HIGH (ref 70–99)
Glucose, Bld: 114 mg/dL — ABNORMAL HIGH (ref 70–99)
Potassium: 4.4 mEq/L (ref 3.5–5.1)
Sodium: 138 mEq/L (ref 135–145)
Total Bilirubin: 1.4 mg/dL — ABNORMAL HIGH (ref 0.3–1.2)
Total Bilirubin: 0.6 mg/dL (ref 0.3–1.2)
Total Bilirubin: 0.8 mg/dL (ref 0.3–1.2)
Total Protein: 6.4 g/dL (ref 6.0–8.3)
Total Protein: 6.9 g/dL (ref 6.0–8.3)
Total Protein: 6.9 g/dL (ref 6.0–8.3)

## 2010-05-25 LAB — GLUCOSE, CAPILLARY
Glucose-Capillary: 104 mg/dL — ABNORMAL HIGH (ref 70–99)
Glucose-Capillary: 141 mg/dL — ABNORMAL HIGH (ref 70–99)
Glucose-Capillary: 143 mg/dL — ABNORMAL HIGH (ref 70–99)
Glucose-Capillary: 93 mg/dL (ref 70–99)
Glucose-Capillary: 125 mg/dL — ABNORMAL HIGH (ref 70–99)

## 2010-05-25 LAB — PROTEIN S, TOTAL: Protein S Ag, Total: 99 % (ref 70–140)

## 2010-05-25 LAB — DIFFERENTIAL
Basophils Absolute: 0 10*3/uL (ref 0.0–0.1)
Eosinophils Absolute: 0.2 10*3/uL (ref 0.0–0.7)
Eosinophils Relative: 2 % (ref 0–5)
Lymphocytes Relative: 29 % (ref 12–46)
Lymphs Abs: 2.8 10*3/uL (ref 0.7–4.0)
Lymphs Abs: 2.5 10*3/uL (ref 0.7–4.0)
Monocytes Absolute: 0.6 10*3/uL (ref 0.1–1.0)
Monocytes Absolute: 0.6 10*3/uL (ref 0.1–1.0)
Monocytes Relative: 7 % (ref 3–12)
Neutro Abs: 5.4 10*3/uL (ref 1.7–7.7)

## 2010-05-25 LAB — POCT I-STAT, CHEM 8
BUN: 6 mg/dL (ref 6–23)
Chloride: 107 mEq/L (ref 96–112)
Creatinine, Ser: 0.7 mg/dL (ref 0.4–1.2)
Potassium: 3.7 mEq/L (ref 3.5–5.1)
Sodium: 140 mEq/L (ref 135–145)

## 2010-05-25 LAB — URINALYSIS, MICROSCOPIC ONLY
Bilirubin Urine: NEGATIVE
Ketones, ur: NEGATIVE mg/dL
Leukocytes, UA: NEGATIVE
Nitrite: NEGATIVE
Specific Gravity, Urine: 1.01 (ref 1.005–1.030)
Urobilinogen, UA: 1 mg/dL (ref 0.0–1.0)
pH: 7.5 (ref 5.0–8.0)

## 2010-05-25 LAB — CARDIAC PANEL(CRET KIN+CKTOT+MB+TROPI)
CK, MB: 0.5 ng/mL (ref 0.3–4.0)
CK, MB: 0.5 ng/mL (ref 0.3–4.0)
Relative Index: INVALID (ref 0.0–2.5)
Total CK: 54 U/L (ref 7–177)
Total CK: 55 U/L (ref 7–177)
Troponin I: 0.01 ng/mL (ref 0.00–0.06)
Troponin I: <0.01 ng/mL (ref 0.00–0.06)

## 2010-05-25 LAB — LIPID PANEL
HDL: 64 mg/dL (ref >39)
LDL Cholesterol: 94 mg/dL (ref 0–99)
Total CHOL/HDL Ratio: 2.7 RATIO
Triglycerides: 66 mg/dL (ref <150)
VLDL: 13 mg/dL (ref 0–40)

## 2010-05-25 LAB — CBC
HCT: 39 % (ref 36.0–46.0)
HCT: 41.2 % (ref 36.0–46.0)
HCT: 36.3 % (ref 36.0–46.0)
Hemoglobin: 12.9 g/dL (ref 12.0–15.0)
Hemoglobin: 12.5 g/dL (ref 12.0–15.0)
MCV: 84.7 fL (ref 78.0–100.0)
MCV: 85.6 fL (ref 78.0–100.0)
Platelets: 240 10*3/uL (ref 150–400)
Platelets: 253 10*3/uL (ref 150–400)
RBC: 4.81 MIL/uL (ref 3.87–5.11)
RDW: 13.6 % (ref 11.5–15.5)
RDW: 13.6 % (ref 11.5–15.5)
RDW: 13.6 % (ref 11.5–15.5)
WBC: 8.8 10*3/uL (ref 4.0–10.5)
WBC: 7.4 10*3/uL (ref 4.0–10.5)
WBC: 8.6 10*3/uL (ref 4.0–10.5)

## 2010-05-25 LAB — CK TOTAL AND CKMB (NOT AT ARMC)
CK, MB: 0.7 ng/mL (ref 0.3–4.0)
Relative Index: INVALID (ref 0.0–2.5)
Relative Index: INVALID (ref 0.0–2.5)
Total CK: 52 U/L (ref 7–177)

## 2010-05-25 LAB — BETA-2-GLYCOPROTEIN I ABS, IGG/M/A
Beta-2 Glyco I IgG: 1 G Units (ref <20)
Beta-2-Glycoprotein I IgA: 4 A Units (ref <20)
Beta-2-Glycoprotein I IgM: 7 M Units (ref <20)

## 2010-05-25 LAB — HOMOCYSTEINE: Homocysteine: 5.7 umol/L (ref 4.0–15.4)

## 2010-05-25 LAB — SEDIMENTATION RATE: Sed Rate: 3 mm/hr (ref 0–22)

## 2010-05-25 LAB — APTT: aPTT: 30 seconds (ref 24–37)

## 2010-05-25 LAB — HEMOGLOBIN A1C
Hgb A1c MFr Bld: 5.7 % — ABNORMAL HIGH (ref <5.7)
Mean Plasma Glucose: 117 mg/dL — ABNORMAL HIGH (ref <117)
Mean Plasma Glucose: 120 mg/dL — ABNORMAL HIGH (ref <117)

## 2010-05-25 LAB — PROTEIN C ACTIVITY: Protein C Activity: 140 % — ABNORMAL HIGH (ref 75–133)

## 2010-05-25 LAB — POCT CARDIAC MARKERS
CKMB, poc: <1 ng/mL — ABNORMAL LOW (ref 1.0–8.0)
Myoglobin, poc: 51.7 ng/mL (ref 12–200)

## 2010-05-25 LAB — RAPID URINE DRUG SCREEN, HOSP PERFORMED
Amphetamines: NOT DETECTED
Opiates: NOT DETECTED
Tetrahydrocannabinol: NOT DETECTED

## 2010-05-25 LAB — D-DIMER, QUANTITATIVE: D-Dimer, Quant: 0.45 ug/mL-FEU (ref 0.00–0.48)

## 2010-05-25 LAB — LUPUS ANTICOAGULANT PANEL
Lupus Anticoagulant: NOT DETECTED
PTT Lupus Anticoagulant: 48.4 secs — ABNORMAL HIGH (ref 32.0–43.4)

## 2010-05-25 LAB — FACTOR 5 LEIDEN

## 2010-05-25 LAB — PROTEIN S ACTIVITY: Protein S Activity: 108 % (ref 69–129)

## 2010-05-25 LAB — TROPONIN I: Troponin I: 0.01 ng/mL (ref 0.00–0.06)

## 2010-05-25 LAB — ANA: Anti Nuclear Antibody(ANA): NEGATIVE

## 2010-05-25 LAB — PROTIME-INR: INR: 1.02 (ref 0.00–1.49)

## 2010-05-25 LAB — PROTEIN C, TOTAL: Protein C, Total: 95 % (ref 70–140)

## 2010-05-25 LAB — PROTHROMBIN GENE MUTATION

## 2010-05-25 LAB — CARDIOLIPIN ANTIBODIES, IGG, IGM, IGA: Anticardiolipin IgM: 4 MPL U/mL — ABNORMAL LOW (ref <11)

## 2010-05-27 ENCOUNTER — Other Ambulatory Visit: Payer: Self-pay

## 2010-05-28 ENCOUNTER — Other Ambulatory Visit (INDEPENDENT_AMBULATORY_CARE_PROVIDER_SITE_OTHER): Payer: BC Managed Care – PPO

## 2010-05-28 DIAGNOSIS — Z0000 Encounter for general adult medical examination without abnormal findings: Secondary | ICD-10-CM

## 2010-05-28 LAB — BASIC METABOLIC PANEL
BUN: 10 mg/dL (ref 6–23)
CO2: 26 mEq/L (ref 19–32)
Chloride: 108 mEq/L (ref 96–112)
Creatinine, Ser: 0.7 mg/dL (ref 0.4–1.2)
Glucose, Bld: 83 mg/dL (ref 70–99)

## 2010-05-28 LAB — URINALYSIS, ROUTINE W REFLEX MICROSCOPIC
Bilirubin Urine: NEGATIVE
Ketones, ur: 15
Leukocytes, UA: NEGATIVE
Nitrite: POSITIVE
Urobilinogen, UA: 0.2 (ref 0.0–1.0)
pH: 5.5 (ref 5.0–8.0)

## 2010-05-28 LAB — CBC WITH DIFFERENTIAL/PLATELET
Basophils Relative: 0.5 % (ref 0.0–3.0)
Hemoglobin: 13.1 g/dL (ref 12.0–15.0)
Lymphocytes Relative: 33.6 % (ref 12.0–46.0)
MCHC: 34.3 g/dL (ref 30.0–36.0)
Monocytes Relative: 8.9 % (ref 3.0–12.0)
Neutro Abs: 3.4 10*3/uL (ref 1.4–7.7)
RBC: 4.58 Mil/uL (ref 3.87–5.11)

## 2010-05-28 LAB — LIPID PANEL
Cholesterol: 173 mg/dL (ref 0–200)
Triglycerides: 41 mg/dL (ref 0.0–149.0)

## 2010-05-28 LAB — HEPATIC FUNCTION PANEL
ALT: 18 U/L (ref 0–35)
Total Protein: 6.3 g/dL (ref 6.0–8.3)

## 2010-06-02 ENCOUNTER — Encounter: Payer: Self-pay | Admitting: Internal Medicine

## 2010-06-02 ENCOUNTER — Ambulatory Visit (INDEPENDENT_AMBULATORY_CARE_PROVIDER_SITE_OTHER): Payer: BC Managed Care – PPO | Admitting: Internal Medicine

## 2010-06-02 VITALS — BP 100/70 | HR 70 | Temp 99.6°F | Ht 62.0 in | Wt 194.0 lb

## 2010-06-02 DIAGNOSIS — Z0000 Encounter for general adult medical examination without abnormal findings: Secondary | ICD-10-CM

## 2010-06-02 NOTE — Progress Notes (Signed)
Subjective:    Patient ID: Alejandra Harmon, female    DOB: 07-11-1960, 50 y.o.   MRN: [id number]  HPI  Here for wellness and f/u;  Overall doing ok;  Pt denies CP, worsening SOB, DOE, wheezing, orthopnea, PND, worsening LE edema, palpitations, dizziness or syncope.  Pt denies neurological change such as new Headache, facial or extremity weakness.  Pt denies polydipsia, polyuria, or low sugar symptoms. Pt states overall good compliance with treatment and medications, good tolerability, and trying to follow lower cholesterol diet.  Pt denies worsening depressive symptoms, suicidal ideation or panic. No fever, wt loss, night sweats, loss of appetite, or other constitutional symptoms.  Pt states good ability with ADL's, low fall risk, home safety reviewed and adequate, no significant changes in hearing or vision, and occasionally active with exercise.  Does have some difficulty losing wt despite more exercise in the past month with boot camp, high stress job, and occasional right knee ache with out swellijng, giveaway , gait change or fall with running.    Past Medical History  Diagnosis Date  . ANXIETY 07/03/2007  . DEPRESSION 07/03/2007  . OBSTRUCTIVE SLEEP APNEA 08/19/2009  . ALLERGIC RHINITIS 07/03/2007  . GERD 08/31/2009  . BREAST HYPERTROPHY 10/30/2008  . KNEE PAIN, RIGHT 05/26/2009  . BACK PAIN 10/30/2008  . Right thyroid nodule    Past Surgical History  Procedure Date  . Cholecystectomy   . S/p c-section   . S/p breast cyst 1980  . Right elbow 2005    Right elbow ulnar nerve decompression  . Abdominal adhesion surgery 2005    Lysis of adhesion and right ovary removal  . Bilateral reduction mammoplasty 2010    reports that she has never smoked. She does not have any smokeless tobacco history on file. She reports that she does not drink alcohol or use illicit drugs. family history includes Cancer in her other; Diabetes in her other; and Hypertension in her other. Not on File  Current  Outpatient Prescriptions on File Prior to Visit  Medication Sig Dispense Refill  . aspirin 325 MG EC tablet Take 325 mg by mouth daily.        . diazepam (VALIUM) 5 MG tablet Take 5 mg by mouth at bedtime. As needed       . omeprazole (PRILOSEC) 20 MG capsule Take 20 mg by mouth 2 (two) times daily.         Review of Systems Review of Systems  Constitutional: Negative for diaphoresis, activity change, appetite change and unexpected weight change.  HENT: Negative for hearing loss, ear pain, facial swelling, mouth sores and neck stiffness.   Eyes: Negative for pain, redness and visual disturbance.  Respiratory: Negative for shortness of breath and wheezing.   Cardiovascular: Negative for chest pain and palpitations.  Gastrointestinal: Negative for diarrhea, blood in stool, abdominal distention and rectal pain.  Genitourinary: Negative for hematuria, flank pain and decreased urine volume.  Musculoskeletal: Negative for myalgias and joint swelling.  Skin: Negative for color change and wound.  Neurological: Negative for syncope and numbness.  Hematological: Negative for adenopathy.  Psychiatric/Behavioral: Negative for hallucinations, self-injury, decreased concentration and agitation.      Objective:   Physical Exam Physical Exam  VS noted  BP 100/70  Pulse 70  Temp(Src) 99.6 F (37.6 C) (Oral)  Ht 5\' 2"  (1.575 m)  Wt 194 lb (87.998 kg)  BMI 35.48 kg/m2  SpO2 99%  LMP 05/30/2010  Constitutional: Pt is oriented to person, place, and  time. Appears well-developed and well-nourished.  HENT:  Head: Normocephalic and atraumatic.  Right Ear: External ear normal.  Left Ear: External ear normal.  Nose: Nose normal.  Mouth/Throat: Oropharynx is clear and moist.  Eyes: Conjunctivae and EOM are normal. Pupils are equal, round, and reactive to light.  Neck: Normal range of motion. Neck supple. No JVD present. No tracheal deviation present.  Cardiovascular: Normal rate, regular rhythm, normal  heart sounds and intact distal pulses.   Pulmonary/Chest: Effort normal and breath sounds normal.  Abdominal: Soft. Bowel sounds are normal. There is no tenderness.  Musculoskeletal: Normal range of motion. Exhibits no edema.  Lymphadenopathy:  Has no cervical adenopathy.  Neurological: Pt is alert and oriented to person, place, and time. Pt has normal reflexes. No cranial nerve deficit.  Skin: Skin is warm and dry. No rash noted.  Psychiatric:  Has  normal mood and affect. Behavior is normal.         Assessment & Plan:

## 2010-06-02 NOTE — Patient Instructions (Signed)
Continue all other medications as before Please return in 1 year for your yearly visit, or sooner if needed, with labs before

## 2010-06-02 NOTE — Assessment & Plan Note (Signed)

## 2010-07-23 NOTE — Op Note (Signed)
Alejandra Harmon, Alejandra Harmon                ACCOUNT NO.:  [id number]   MEDICAL RECORD NO.:  [id number]          PATIENT TYPE:  AMB   LOCATION:  SDC                           FACILITY:  [facility]   PHYSICIAN:  Dineen Kid. Rana Snare, M.D.    DATE OF BIRTH:  1960/09/03   DATE OF PROCEDURE:  02/18/2004  DATE OF DISCHARGE:                                 OPERATIVE REPORT   PREOPERATIVE DIAGNOSES:  1.  Left lower quadrant pain.  2.  Dyspareunia.   POSTOPERATIVE DIAGNOSES:  1.  Left lower quadrant pain.  2.  Dyspareunia.  3.  Pelvic adhesions.  4.  Right ovarian cyst.  5.  Endometriosis.   PROCEDURES:  Laparoscopy with lysis of adhesions, ablation of endometriosis,  right ovarian cystectomy, and chromopertubation.   SURGEON:  Dineen Kid. Rana Snare, M.D.   ANESTHESIA:  General endotracheal.   INDICATIONS:  Alejandra Harmon is a 50 year old G3, P1, A2, who has had worsening  pelvic pain, mostly in the left lower quadrant.  She has a questionable  history of PID and ovarian cyst on the left in the past and desires more  definitive surgical evaluation and treatment of this.  Recently had an HSG,  which showed left tubal mid-tubal obstruction.  Risks and benefits of the  procedure, which include but are not limited to risk of infection, bleeding,  damage to bowel or bladder, uterus, tubes, ovaries, the possibility that  this may not alleviate the pelvic pain, or adhesions can return.  She does  give informed consent and wishes to proceed.   FINDINGS AT THE TIME OF SURGERY:  Bilateral tubes are patent on  chromopertubation.  There are omentum and colon which are adhered to the  left tubo-ovarian complex.  The left ovarian fossa and cul-de-sac are very  indurated, consistent with either remote PID history of recent  endometriosis.  The right ovarian fossa essentially normal.  The right ovary  has a 1.5 cm cyst, which is consistent with a theca luteum cyst, which was  removed.  There were adhesions from the cecum to the  anterior abdominal  wall.   DESCRIPTION OF PROCEDURE:  After adequate analgesia, the patient placed in  the dorsal lithotomy position, where she was sterilely prepped and draped,  the bladder was sterilely drained.  A Graves speculum was placed.  The  tenaculum was placed on the anterior lip of the cervix and an acorn  tenaculum was placed.  A 1 cm infraumbilical skin incision was made, a  Veress needle was inserted.  The abdomen was insufflated to dullness to  percussion.  An 11 mm trocar was then inserted.  A 5 mm trocar was inserted  to the left of midline two fingerbreadths above the pubic symphysis under  direct visualization.  The above findings were noted.  Bipolar cautery was  used to coagulate and then Endoshears used to dissect the omentum and bowel  from the left pelvic sidewall and left tubo-ovarian complex.  Upon elevating  the uterus, noted a large amount of indurated peritoneum both in the cul-de-  sac and the left ovarian  fossa, unsure whether this represents adhesions  that were recently removed by bluntly elevating the retroverted uterus from  the pelvis.  Because of the indurated areas and areas that are suspicious  for endometriosis, bipolar cautery was used to cauterize these indurated  areas with double burn noted through the peritoneal windows, similarly along  the uterosacral ligaments, more on the left than on the right.  In the cul-  de-sac, bipolar cautery was used to cauterize the indurated area with good  hemostasis achieved and evidence of the windows removed.  After all areas  that appeared to be suspicious for endometriosis were ablated, attention was  turned toward the right ovary, which has a 1.5 cm cyst, appeared to be more  solid in nature.  Bipolar was used to cauterize the base of this cyst.  It  was sharply excised with a yellow sebaceous-appearing material and also dark  bloody material with hemosiderin at the base of this.  This was removed and   sent to pathology.  The base of the ovarian cyst was then cauterized with  good hemostasis achieved.  A small serous cyst on the left ovary was also  easily drained and the base of this cauterized.  The cecum was adhered with  a long window of adhesion to the right pelvic and right abdominal wall.  This was coagulated with bipolar cautery and dissected using Endoshears with  the cecum falling away from the anterior and the right lateral wall very  nicely, and the appendix appearing to be normal.  After careful examination  of the pelvis and cul-de-sac, all evidence of indurated area consistent with  endometriosis were ablated with no evidence of residual adhesions.  Chromopertubation did show bilateral spill from both fimbriated ends.  The  left tube, however, did have more of a tortuous course upon presentation of  the blue fluid from the fimbriated end.  The midportion of the tube was  mildly dilated, but no evidence of obstruction was noted.  At this time the  abdomen was desufflated, trocars removed.  The infraumbilical skin incision  was closed with a 0 Vicryl interrupted suture in the fascia, a 3-0 Vicryl  Rapide subcuticular suture.  The 5 mm site was closed in 3-0 Vicryl Rapide  subcuticular suture.  The incisions were injected with 0.25% Marcaine.  The  tenaculum was removed from the cervix, noted to be hemostatic.  The patient  tolerated the procedure well, was stable on transfer to the recovery room.  The sponge, needle and instrument count was normal x3.  Estimated blood loss  less than 10 mL.  The patient received 1 g of Rocephin preoperatively and 30  mg of Toradol postoperatively.   DISPOSITION:  The patient will be discharged home and will follow up in the  office in two to three weeks, sent home with a routine instruction sheet for  laparoscopy.  Told to return for increased pain, fever, or bleeding.  She is also sent home with a prescription for Vicodin #30.      [name]   DCL/MEDQ  D:  02/18/2004  T:  02/18/2004  Job:  [id number]

## 2010-07-23 NOTE — Op Note (Signed)
Alejandra Harmon, Alejandra Harmon                          ACCOUNT NO.:  [id number]   MEDICAL RECORD NO.:  [id number]                   PATIENT TYPE:  AMB   LOCATION:  DSC                                  FACILITY:  [facility]   PHYSICIAN:  Claude Manges. Cleophas Dunker, M.D.            DATE OF BIRTH:  05-29-1960   DATE OF PROCEDURE:  05/13/2003  DATE OF DISCHARGE:                                 OPERATIVE REPORT   PREOPERATIVE DIAGNOSIS:  Compressive ulnar neuropathy, right elbow.   POSTOPERATIVE DIAGNOSIS:  Compressive ulnar neuropathy, right elbow.   PROCEDURE:  Decompression ulnar nerve, right elbow.   SURGEON:  Claude Manges. Cleophas Dunker, M.D.   ANESTHESIA:  General orotracheal.   COMPLICATIONS:  None.   HISTORY:  50 year old female has been followed for problems referable to her  right upper extremity.  She has tingling and numbness into the ulnar digits  of her right hand with the tenderness directly over the ulnar nerve at the  medial aspect of the elbow.  Nerve conduction studies were negative.  She  continues to have a problem to the point of compromise despite time, anti-  inflammatory medicines, and now will have a decompression of the nerve.   DESCRIPTION OF PROCEDURE:  With the patient comfortable on the operating  table and under general orotracheal anesthesia, the right upper extremity  was placed in an arm tourniquet.  The extremity was then prepped with  DuraPrep from the wrist to the tourniquet, sterile draping was performed.  A  skin incision was outlined between the medial epicondyle and the olecranon  directly over the ulnar nerve at the level of the elbow.  The incision was  approximately 2-3 inches in length.  Using sharp dissection, the incision  was carried down to the subcutaneous tissue and extended proximally and  distally.  By blunt dissection using the blunt tip scissors, the  subcutaneous tissue was then elevated off the fascia.  The nerve was  palpated in the deep fascia.  I was  able to find the exposed nerve more  proximally just beneath the fascia.  This was carefully incised with the  nerve visualized.  I then carefully incised the fascia across the elbow  between the medial epicondyle olecranon and continued through the muscle  fascia more distally.  The nerve was obviously compressed.  There was a  bulbous appearance to the nerve proximally to the soft tissue compression  directly at the level of the elbow.  There was a very tight stricture which  was carefully decompressed.  The flexor muscle fibers were then elevated and  any fascial restriction on the nerve was then carefully incised.  The first  muscular branch was identified and carefully preserved.  At that point, the  nerve was completely decompressed, both proximally and distally without any  further stricture.  The wound was then closed in several layers with 2-0 and  4-0 Vicryl.  The  skin was closed with running 3-0 subcuticular Prolene with  Steri-Strips over Benzoin.  0.25% Marcaine without epinephrine was injected  into the wound edges.  A sterile, bulky dressing was applied.  The  tourniquet was released with immediate capillary refill to the fingers.  With the  elbow at 90 degrees, a posterior splint was applied followed by Ace bandages  and a sling.  The patient tolerated the procedure without complications.   PLAN:  Percocet for pain.  Office in one week.                                               Claude Manges. Cleophas Dunker, M.D.    PWW/MEDQ  D:  05/13/2003  T:  05/13/2003  Job:  [id number]

## 2010-08-18 ENCOUNTER — Other Ambulatory Visit: Payer: Self-pay | Admitting: Obstetrics and Gynecology

## 2010-08-18 DIAGNOSIS — N6320 Unspecified lump in the left breast, unspecified quadrant: Secondary | ICD-10-CM

## 2010-08-25 ENCOUNTER — Ambulatory Visit
Admission: RE | Admit: 2010-08-25 | Discharge: 2010-08-25 | Disposition: A | Discharge status: Home / Self Care | Payer: BC Managed Care – PPO | Source: Ambulatory Visit | Attending: Obstetrics and Gynecology | Admitting: Obstetrics and Gynecology

## 2010-08-25 DIAGNOSIS — N6320 Unspecified lump in the left breast, unspecified quadrant: Secondary | ICD-10-CM

## 2010-10-04 ENCOUNTER — Ambulatory Visit: Payer: BC Managed Care – PPO | Admitting: Internal Medicine

## 2010-10-04 ENCOUNTER — Telehealth: Payer: Self-pay

## 2010-10-04 NOTE — Telephone Encounter (Signed)
[facility] Triage Call Report Triage Record Num: [id number] Operator: Aundra Millet Patient Name: Alejandra Harmon Call Date & Time: 10/04/2010 7:29:09AM Patient Phone: [phone] PCP: Oliver Barre Patient Gender: Female PCP Fax : [phone] Patient DOB: 06/06/1960 Practice Name: [facility] Reason for Call: Today, 10/04/2010 , pt calling stating she had scheduled appt for today at 0800 for episode of leg weakness on 10/01/2010. Marland Kitchen Pt calling wanting to cancel b/c she is feeling better with no further episodes. . RN encouraged to keep appt to be evaluated and pt declined and will call when office reopens to cancel and r/s. Protocol(s) Used: Office Note Recommended Outcome per Protocol: Information Noted and Sent to Office Reason for Outcome: Caller information to office Care Advice: ~ 10/04/2010 7:33:01AM Page 1 of 1 [id number]

## 2010-10-21 ENCOUNTER — Telehealth: Payer: Self-pay

## 2010-10-21 DIAGNOSIS — M25519 Pain in unspecified shoulder: Secondary | ICD-10-CM

## 2010-10-21 NOTE — Telephone Encounter (Signed)
Done per emr 

## 2010-10-21 NOTE — Telephone Encounter (Signed)
Pt advised.

## 2010-10-21 NOTE — Telephone Encounter (Signed)
Pt called requesting referral to Ortho for persistent shoulder pain x 3 weeks.

## 2010-12-14 ENCOUNTER — Emergency Department (HOSPITAL_COMMUNITY)
Admission: EM | Admit: 2010-12-14 | Discharge: 2010-12-14 | Discharge status: Against Medical Advice | Payer: BC Managed Care – PPO | Attending: Emergency Medicine | Admitting: Emergency Medicine

## 2010-12-14 DIAGNOSIS — R209 Unspecified disturbances of skin sensation: Secondary | ICD-10-CM | POA: Insufficient documentation

## 2010-12-14 DIAGNOSIS — R059 Cough, unspecified: Secondary | ICD-10-CM | POA: Insufficient documentation

## 2010-12-14 DIAGNOSIS — R05 Cough: Secondary | ICD-10-CM | POA: Insufficient documentation

## 2010-12-15 ENCOUNTER — Ambulatory Visit: Payer: BC Managed Care – PPO | Admitting: Internal Medicine

## 2010-12-17 ENCOUNTER — Encounter: Payer: Self-pay | Admitting: Internal Medicine

## 2010-12-17 ENCOUNTER — Ambulatory Visit (INDEPENDENT_AMBULATORY_CARE_PROVIDER_SITE_OTHER): Payer: BC Managed Care – PPO | Admitting: Internal Medicine

## 2010-12-17 VITALS — BP 124/80 | HR 90 | Temp 98.7°F | Wt 189.5 lb

## 2010-12-17 DIAGNOSIS — R7302 Impaired glucose tolerance (oral): Secondary | ICD-10-CM | POA: Insufficient documentation

## 2010-12-17 DIAGNOSIS — Z0000 Encounter for general adult medical examination without abnormal findings: Secondary | ICD-10-CM

## 2010-12-17 DIAGNOSIS — R7309 Other abnormal glucose: Secondary | ICD-10-CM

## 2010-12-17 DIAGNOSIS — F411 Generalized anxiety disorder: Secondary | ICD-10-CM

## 2010-12-17 DIAGNOSIS — G459 Transient cerebral ischemic attack, unspecified: Secondary | ICD-10-CM

## 2010-12-17 MED ORDER — ASPIRIN EC 81 MG PO TBEC: 81.0000 mg | DELAYED_RELEASE_TABLET | Freq: Every day | ORAL | Status: AC

## 2010-12-17 NOTE — Patient Instructions (Addendum)
Please start Aspirin at 81 mg per day, COATED only Continue all other medications as before You will be contacted regarding the referral for: MRI for the brain, and referral to Dr Wong/neurology Please see PCC's before leaving today Please return in 6 mo with Lab testing done 3-5 days before

## 2010-12-18 ENCOUNTER — Encounter: Payer: Self-pay | Admitting: Internal Medicine

## 2010-12-18 NOTE — Assessment & Plan Note (Signed)
Asympt;  Advised for more activity, wt loss, and check a1c with next labs

## 2010-12-18 NOTE — Assessment & Plan Note (Signed)
I think an ongoing problem for her, but suspect not the primary reason for recent symptoms, though her recurrence of similar symptoms 3 times suggest element of psychogenic?

## 2010-12-18 NOTE — Assessment & Plan Note (Addendum)
Symptoms highly suggestive, to start ASA 81 mg to which she agrees, has had MRI, carotids and other w/u July 2012 of which I was not able to access complete records at this time;  Symptoms now resolved, exam benign,  Pt interested in neuro referral  - will do, and to repeat MRI head

## 2010-12-18 NOTE — Progress Notes (Signed)
Subjective:    Patient ID: Alejandra Harmon, female    DOB: Aug 01, 1960, 50 y.o.   MRN: [id number]  HPI  Here to f/u with acute c/o TIA like symptoms for which she went to the ER 2 days ago when occurred but seemed to improve and the wait was so long that she and a friend left the ER and symptoms has improved;  Has had 2 other episode in the past she says were similar, and today suggests may have been anxiety related as I think she was told in the past;  First and second episode was may 2011 and July 2012  - the latter for which she states she was hospd approx 5 days with full evaluation and d/c with recommendation for ASA 325 mg, but did not take as she though it was "too strong."  Since she seemed to have fluid retention and elev BP while taking.  Does have stress related memory forgetfulness, chronic anxiety and very hard at work which may have been a factor in her divorce.  But this episode was characterized by clear approx 3 hr episode of not being able to speak words she was thinking, some facial drooping noted by her friend, and RUE and RLE weakness with tingling to the RUE as well.  She could ambulate but both legs felt like sandbags, in fact she fell to the ground trying to walk and struck right side of head but no LOC or other significant injury or HA since.  While sitting in the ER she and her friend noted her right sided weakness improving, so they left since they were told the wait was quite some time.  She was hoping the ibuprofen she takes for recurrent left neck and shoulder pain would have helped in lieu of the ASA 325 but has not taken often since her last d/c.   No current symptoms, and new complaints o/w today.   Pt denies fever, wt loss, night sweats, loss of appetite, or other constitutional symptoms   States incidentally that a cbg she checked with someone else glucometer was 140 a few wks ago.   Pt denies polydipsia, polyuria.  Pt denies chest pain, increased sob or doe, wheezing,  orthopnea, PND, increased LE swelling, palpitations, dizziness or syncope. Past Medical History  Diagnosis Date  . ANXIETY 07/03/2007  . DEPRESSION 07/03/2007  . OBSTRUCTIVE SLEEP APNEA 08/19/2009  . ALLERGIC RHINITIS 07/03/2007  . GERD 08/31/2009  . BREAST HYPERTROPHY 10/30/2008  . KNEE PAIN, RIGHT 05/26/2009  . BACK PAIN 10/30/2008  . Right thyroid nodule    Past Surgical History  Procedure Date  . Cholecystectomy   . S/p c-section   . S/p breast cyst 1980  . Right elbow 2005    Right elbow ulnar nerve decompression  . Abdominal adhesion surgery 2005    Lysis of adhesion and right ovary removal  . Bilateral reduction mammoplasty 2010    reports that she has never smoked. She does not have any smokeless tobacco history on file. She reports that she does not drink alcohol or use illicit drugs. family history includes Cancer in her other; Diabetes in her other; and Hypertension in her other. No Known Allergies Current Outpatient Prescriptions on File Prior to Visit  Medication Sig Dispense Refill  . diazepam (VALIUM) 5 MG tablet Take 5 mg by mouth at bedtime. As needed       . omeprazole (PRILOSEC) 20 MG capsule Take 20 mg by mouth 2 (two) times daily.  Review of Systems Review of Systems  Constitutional: Negative for diaphoresis and unexpected weight change.  HENT: Negative for drooling and tinnitus.   Eyes: Negative for photophobia and visual disturbance.  Respiratory: Negative for choking and stridor.   Gastrointestinal: Negative for vomiting and blood in stool.  Genitourinary: Negative for hematuria and decreased urine volume.       Objective:   Physical Exam BP 124/80  Pulse 90  Temp(Src) 98.7 F (37.1 C) (Oral)  Wt 189 lb 8 oz (85.957 kg)  SpO2 98% Physical Exam  VS noted, not ill appearing, obese Constitutional: Pt appears well-developed and well-nourished.  HENT: Head: Normocephalic.  Right Ear: External ear normal.  Left Ear: External ear normal.  Eyes:  Conjunctivae and EOM are normal. Pupils are equal, round, and reactive to light.  Neck: Normal range of motion. Neck supple.  Cardiovascular: Normal rate and regular rhythm.   Pulmonary/Chest: Effort normal and breath sounds normal.  Neurological: Pt is alert. No cranial nerve deficit. motor/sens/dtr/gait intact Skin: Skin is warm. No erythema.  Psychiatric: Pt behavior is normal. Thought content normal.     Assessment & Plan:

## 2010-12-21 ENCOUNTER — Encounter: Payer: Self-pay | Admitting: Neurology

## 2010-12-22 ENCOUNTER — Ambulatory Visit
Admission: RE | Admit: 2010-12-22 | Discharge: 2010-12-22 | Disposition: A | Discharge status: Home / Self Care | Payer: BC Managed Care – PPO | Source: Ambulatory Visit | Attending: Internal Medicine | Admitting: Internal Medicine

## 2010-12-22 DIAGNOSIS — G459 Transient cerebral ischemic attack, unspecified: Secondary | ICD-10-CM

## 2010-12-31 ENCOUNTER — Ambulatory Visit: Payer: BC Managed Care – PPO | Admitting: Neurology

## 2011-01-06 ENCOUNTER — Encounter: Payer: Self-pay | Admitting: Neurology

## 2011-01-06 ENCOUNTER — Ambulatory Visit (INDEPENDENT_AMBULATORY_CARE_PROVIDER_SITE_OTHER): Payer: BC Managed Care – PPO | Admitting: Neurology

## 2011-01-06 VITALS — BP 142/90 | HR 92 | Ht 62.75 in | Wt 190.0 lb

## 2011-01-06 DIAGNOSIS — G459 Transient cerebral ischemic attack, unspecified: Secondary | ICD-10-CM

## 2011-01-06 NOTE — Progress Notes (Signed)
Dear Dr. Jonny Ruiz,  Thank you for having me see Alejandra Harmon in consultation today at [facility] for her problem with 3 spells of weakness of the legs, stuttering, imbalance and one spell of right sided weakness.  As you may recall, she is a 50 y.o. year old female with a history of anxiety who first had a spell of bilateral leg weakness and heaviness that lasted minutes, it was followed by dizziness and feeling imbalanced.  She had also arm cramping, but denied double vision or dysphagia.  She did have stuttering that lasted minutes.  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  After she was discharged from the hospital she had another spell of bilateral weakness of the legs, with stuttering of speech lasting several hours.  She was readmitted to hospital but again they did not determine a cause.  In July 2011 she again had weakness of her legs, difficulty speaking, with tingling of the hands the lasted hours.  She was not admitted to hospital for this event.  Finally, October 14th should had another event that was different, with right sided weakness lasting several hours.  A repeat MRI brain was unrevealing but this was done several days after the event.      Past Medical History  Diagnosis Date  . ANXIETY 07/03/2007  . DEPRESSION 07/03/2007  . OBSTRUCTIVE SLEEP APNEA 08/19/2009  . ALLERGIC RHINITIS 07/03/2007  . GERD 08/31/2009  . BREAST HYPERTROPHY 10/30/2008  . KNEE PAIN, RIGHT 05/26/2009  . BACK PAIN 10/30/2008  . Right thyroid nodule     Past Surgical History  Procedure Date  . Cholecystectomy   . S/p c-section   . S/p breast cyst 1980  . Right elbow 2005    Right elbow ulnar nerve decompression  . Abdominal adhesion surgery 2005    Lysis of adhesion and right ovary removal  . Bilateral reduction mammoplasty 2010     History   Social History  . Marital Status: Divorced    Spouse Name: N/A    Number of Children: 1  . Years of Education: N/A   Occupational History  . 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 Smoking status: Never Smoker   . Smokeless tobacco: Never Used  . Alcohol Use: No  . Drug Use: No  . Sexually Active: None   Other Topics Concern  . None   Social History Narrative  . None    Family History  Problem Relation Age of Onset  . Diabetes Other   . Hypertension Other   . Cancer Other     ovarian cancer      ROS:  13 systems were reviewed and are notable for significant stress and anxiety.  She also gets left sided throbbing headaches, although these were not associated with the spells.  All other review of systems are unremarkable.   Examination:  Filed Vitals:   01/06/11 1333  BP: 142/90  Pulse: 92  Height: 5' 2.75" (1.594 m)  Weight: 190 lb (86.183 kg)     In general, well appearing older woman.  Cardiovascular: The patient has a regular rate and rhythm and no carotid bruits.  Fundoscopy:  Disks are flat. Vessel caliber within normal limits.  Mental status:   The patient is  oriented to person, place and time. Recent and remote memory are intact. Attention span and concentration are normal. Language including repetition, naming, following commands are intact. Fund of knowledge of current and historical events, as well as vocabulary are normal.  Cranial Nerves: Pupils are equally round and reactive to light. Visual fields full to confrontation. Extraocular movements are intact without nystagmus. Facial sensation and muscles of mastication are intact. Muscles of facial expression are symmetric. Hearing intact to bilateral finger rub. Tongue protrusion, uvula, palate midline.  Shoulder shrug intact  Motor:  The patient has normal bulk and tone, no pronator drift.  There are no adventitious  movements.  5/5 bilaterally.  Reflexes:   Biceps  Triceps Brachioradialis Knee Ankle  Right 2+  2+  2+   2+ 2+  Left  2+  2+  2+   2+ 2+  Toes down  Coordination:  Normal finger to nose.  No dysdiadokinesia.  Sensation is intact to temperature and vibration.  Gait and Station are normal.  Tandem gait is intact.  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  Note should be made that ACA has bilateral origins  Impression/Recs: Multiple spells of leg weakness and difficulty speaking as well as spell of right sided weakness.  Concern for TIA.  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  She should continue on aspirin 81mg .  Normally I suggest 325mg  but she said this caused significant swelling.  If the CTA of the head and neck continues to reveal the stenosis of the the left vert I would suggest a change to clopidogrel(although there is no evidence that this would be significantly better for VB disease).    We will see the patient back in 6 weeks.  Thank you for having [place] see Alejandra Harmon in consultation.  Feel free to contact me with any questions.  Lupita Raider Modesto Charon, MD [facility], [place] [address] [place], [place] [address] Phone: [phone] Fax: [phone].

## 2011-01-06 NOTE — Patient Instructions (Signed)
Your CTA's have been scheduled for Thursday, November 8th at 11:30.  Please arrive to [facility] by 11:15 at First floor admitting. [phone].  We will call with the results.  We will also call with your follow up appointment in three months.  Have a great Thanksgiving and Christmas!!

## 2011-01-13 ENCOUNTER — Other Ambulatory Visit (HOSPITAL_COMMUNITY): Payer: BC Managed Care – PPO

## 2011-01-13 ENCOUNTER — Ambulatory Visit (HOSPITAL_COMMUNITY)
Admission: RE | Admit: 2011-01-13 | Discharge: 2011-01-13 | Disposition: A | Discharge status: Home / Self Care | Payer: BC Managed Care – PPO | Source: Ambulatory Visit | Attending: Neurology | Admitting: Neurology

## 2011-01-13 DIAGNOSIS — M6281 Muscle weakness (generalized): Secondary | ICD-10-CM | POA: Insufficient documentation

## 2011-01-13 DIAGNOSIS — G9389 Other specified disorders of brain: Secondary | ICD-10-CM | POA: Insufficient documentation

## 2011-01-13 DIAGNOSIS — R4789 Other speech disturbances: Secondary | ICD-10-CM | POA: Insufficient documentation

## 2011-01-13 DIAGNOSIS — G459 Transient cerebral ischemic attack, unspecified: Secondary | ICD-10-CM

## 2011-01-13 DIAGNOSIS — R209 Unspecified disturbances of skin sensation: Secondary | ICD-10-CM | POA: Insufficient documentation

## 2011-01-13 MED ORDER — IOHEXOL 350 MG/ML SOLN
50.0000 mL | Freq: Once | INTRAVENOUS | Status: AC | PRN
  Administered 2011-01-13: 50 mL via INTRAVENOUS

## 2011-01-20 ENCOUNTER — Telehealth: Payer: Self-pay | Admitting: Neurology

## 2011-01-20 NOTE — Telephone Encounter (Signed)
Pt called for results of CTA

## 2011-01-24 NOTE — Telephone Encounter (Signed)
spoke to patient.  nothing concerning on CTA.    Misty Stanley - could you move her appt to about 3 months from now?  Thx.

## 2011-04-29 ENCOUNTER — Ambulatory Visit: Payer: BC Managed Care – PPO | Admitting: Neurology

## 2012-04-11 ENCOUNTER — Other Ambulatory Visit: Payer: Self-pay | Admitting: Obstetrics and Gynecology

## 2012-04-11 DIAGNOSIS — Z1231 Encounter for screening mammogram for malignant neoplasm of breast: Secondary | ICD-10-CM

## 2012-04-18 ENCOUNTER — Encounter (HOSPITAL_COMMUNITY): Payer: Self-pay | Admitting: *Deleted

## 2012-04-24 ENCOUNTER — Other Ambulatory Visit: Payer: Self-pay | Admitting: Obstetrics and Gynecology

## 2012-04-24 ENCOUNTER — Ambulatory Visit (HOSPITAL_COMMUNITY)
Admission: RE | Admit: 2012-04-24 | Discharge: 2012-04-24 | Disposition: A | Discharge status: Home / Self Care | Payer: BC Managed Care – PPO | Source: Ambulatory Visit | Attending: Obstetrics and Gynecology | Admitting: Obstetrics and Gynecology

## 2012-04-24 ENCOUNTER — Encounter (HOSPITAL_COMMUNITY): Payer: Self-pay

## 2012-04-24 ENCOUNTER — Ambulatory Visit (HOSPITAL_COMMUNITY)
Admission: RE | Admit: 2012-04-24 | Discharge: 2012-04-24 | Disposition: A | Discharge status: Home / Self Care | Payer: Self-pay | Source: Ambulatory Visit | Attending: Obstetrics and Gynecology | Admitting: Obstetrics and Gynecology

## 2012-04-24 VITALS — BP 124/76 | Temp 98.1°F | Ht 62.0 in | Wt 202.0 lb

## 2012-04-24 DIAGNOSIS — Z1231 Encounter for screening mammogram for malignant neoplasm of breast: Secondary | ICD-10-CM

## 2012-04-24 DIAGNOSIS — N6313 Unspecified lump in the right breast, lower outer quadrant: Secondary | ICD-10-CM | POA: Insufficient documentation

## 2012-04-24 DIAGNOSIS — Z1239 Encounter for other screening for malignant neoplasm of breast: Secondary | ICD-10-CM

## 2012-04-24 DIAGNOSIS — N644 Mastodynia: Secondary | ICD-10-CM

## 2012-04-24 DIAGNOSIS — N63 Unspecified lump in unspecified breast: Secondary | ICD-10-CM

## 2012-04-24 NOTE — Patient Instructions (Signed)
Taught patient how to perform BSE. Patient did not need a Pap smear today due to last Pap smear was 04/09/2012. Let her know [facility] will cover Pap smears every 3 years unless has a history of abnormal Pap smears. Referred patient to the [facility] Center of [facility] for diagnostic mammogram and possible right breast ultrasound. Appointment scheduled for Friday, April 27, 2012 at 1345. Patient aware of appointment and will be there. Gave patient information about [facility] since has had two sisters diagnosed with Breast Cancer with one of them being diagnosed in her 49's. Patient verbalized understanding.

## 2012-04-24 NOTE — Progress Notes (Signed)
No complaints today.  Pap Smear:    Pap smear not completed today. Last Pap smear was 04/09/2012 at the free cervical cancer screening at the [facility] and are awaiting results. Per patient no history of abnormal Pap smears. No Pap smear results in EPIC.  Physical exam: Breasts Breasts symmetrical. No skin abnormalities bilateral breasts. No nipple retraction bilateral breasts. No nipple discharge bilateral breasts. No lymphadenopathy. No lumps palpated left breast. Palpated lump within the right breast at 8 o'clock 8 cm from the nipple. Patient complained of tenderness when palpated lump. Referred patient to the [facility] Center of [facility] for diagnostic mammogram and possible right breast ultrasound. Appointment scheduled for Friday, April 27, 2012 at 1345.        Pelvic/Bimanual No Pap smear completed today since last Pap smear was 04/09/12. Pap smear not indicated per BCCCP guidelines.

## 2012-04-27 ENCOUNTER — Ambulatory Visit
Admission: RE | Admit: 2012-04-27 | Discharge: 2012-04-27 | Disposition: A | Discharge status: Home / Self Care | Payer: No Typology Code available for payment source | Source: Ambulatory Visit | Attending: Obstetrics and Gynecology | Admitting: Obstetrics and Gynecology

## 2012-04-27 DIAGNOSIS — N63 Unspecified lump in unspecified breast: Secondary | ICD-10-CM

## 2012-04-27 DIAGNOSIS — N644 Mastodynia: Secondary | ICD-10-CM

## 2012-12-26 ENCOUNTER — Other Ambulatory Visit (HOSPITAL_COMMUNITY): Payer: Self-pay | Admitting: *Deleted

## 2012-12-26 ENCOUNTER — Telehealth (HOSPITAL_COMMUNITY): Payer: Self-pay | Admitting: *Deleted

## 2012-12-26 DIAGNOSIS — N644 Mastodynia: Secondary | ICD-10-CM

## 2012-12-26 NOTE — Telephone Encounter (Signed)
Telephoned patient at home # and left message to return call to [facility] 

## 2013-01-18 ENCOUNTER — Other Ambulatory Visit (HOSPITAL_COMMUNITY): Payer: Self-pay | Admitting: *Deleted

## 2013-01-18 DIAGNOSIS — N6320 Unspecified lump in the left breast, unspecified quadrant: Secondary | ICD-10-CM

## 2013-01-22 ENCOUNTER — Encounter (INDEPENDENT_AMBULATORY_CARE_PROVIDER_SITE_OTHER): Payer: Self-pay

## 2013-01-22 ENCOUNTER — Ambulatory Visit (HOSPITAL_COMMUNITY)
Admission: RE | Admit: 2013-01-22 | Discharge: 2013-01-22 | Disposition: A | Discharge status: Home / Self Care | Payer: Self-pay | Source: Ambulatory Visit | Attending: Obstetrics and Gynecology | Admitting: Obstetrics and Gynecology

## 2013-01-22 ENCOUNTER — Encounter (HOSPITAL_COMMUNITY): Payer: Self-pay

## 2013-01-22 VITALS — BP 120/76 | Temp 98.7°F | Ht 62.0 in | Wt 204.4 lb

## 2013-01-22 DIAGNOSIS — Z1239 Encounter for other screening for malignant neoplasm of breast: Secondary | ICD-10-CM

## 2013-01-22 DIAGNOSIS — N644 Mastodynia: Secondary | ICD-10-CM

## 2013-01-22 NOTE — Patient Instructions (Signed)
Taught patient how to perform BSE. Patient did not need a Pap smear today due to last Pap smear was 04/09/2012. Let her know [facility] will cover Pap smears every 3 years unless has a history of abnormal Pap smears. Referred patient to the [facility] Center of [facility] for diagnostic mammogram. Appointment scheduled for Tuesday, February 05, 2013 at 0745. Patient aware of appointment and will be there. Gave patient information about Genetic testing offered at the [facility] since has had two sisters diagnosed with Breast Cancer with one of them being diagnosed in her 66's and her mother has been recently diagnosed with breast cancer. Patient verbalized understanding.

## 2013-01-22 NOTE — Progress Notes (Signed)
Complaints of left outer breast burning and right breast upper outer breast burning with a hard feeling. Patient states burning pain comes and goes rating at a 7-8 out of 10.  Pap Smear: Pap smear not completed today. Last Pap smear was 04/09/2012 at the free cervical cancer screening at the [facility] and normal per patient. Per patient no history of abnormal Pap smears. No Pap smear results in EPIC.   Physical exam:  Breasts  Breasts symmetrical. No skin abnormalities bilateral breasts. No nipple retraction bilateral breasts. No nipple discharge bilateral breasts. No lymphadenopathy. No lumps palpated bilateral breasts. Patient complained of tenderness when palpated left upper breast and right outer breast. Referred patient to the [facility] Center of [facility] for diagnostic mammogram. Appointment scheduled for Tuesday, February 05, 2013 at 0745.  Pelvic/Bimanual  No Pap smear completed today since last Pap smear was 04/09/12. Pap smear not indicated per BCCCP guidelines.

## 2013-02-05 ENCOUNTER — Ambulatory Visit
Admission: RE | Admit: 2013-02-05 | Discharge: 2013-02-05 | Disposition: A | Discharge status: Home / Self Care | Payer: No Typology Code available for payment source | Source: Ambulatory Visit | Attending: Obstetrics and Gynecology | Admitting: Obstetrics and Gynecology

## 2013-02-05 DIAGNOSIS — N644 Mastodynia: Secondary | ICD-10-CM

## 2013-03-26 ENCOUNTER — Other Ambulatory Visit: Payer: Self-pay

## 2013-04-18 ENCOUNTER — Encounter: Payer: Self-pay | Admitting: Internal Medicine

## 2013-04-18 ENCOUNTER — Ambulatory Visit (INDEPENDENT_AMBULATORY_CARE_PROVIDER_SITE_OTHER): Payer: No Typology Code available for payment source | Admitting: Internal Medicine

## 2013-04-18 ENCOUNTER — Other Ambulatory Visit (INDEPENDENT_AMBULATORY_CARE_PROVIDER_SITE_OTHER): Payer: No Typology Code available for payment source

## 2013-04-18 VITALS — BP 130/90 | HR 75 | Temp 98.1°F | Ht 62.0 in | Wt 204.5 lb

## 2013-04-18 DIAGNOSIS — F411 Generalized anxiety disorder: Secondary | ICD-10-CM

## 2013-04-18 DIAGNOSIS — R7302 Impaired glucose tolerance (oral): Secondary | ICD-10-CM

## 2013-04-18 DIAGNOSIS — N63 Unspecified lump in unspecified breast: Secondary | ICD-10-CM

## 2013-04-18 DIAGNOSIS — N644 Mastodynia: Secondary | ICD-10-CM

## 2013-04-18 DIAGNOSIS — F329 Major depressive disorder, single episode, unspecified: Secondary | ICD-10-CM

## 2013-04-18 DIAGNOSIS — F3289 Other specified depressive episodes: Secondary | ICD-10-CM

## 2013-04-18 DIAGNOSIS — R7309 Other abnormal glucose: Secondary | ICD-10-CM

## 2013-04-18 DIAGNOSIS — Z0000 Encounter for general adult medical examination without abnormal findings: Secondary | ICD-10-CM

## 2013-04-18 DIAGNOSIS — N6313 Unspecified lump in the right breast, lower outer quadrant: Secondary | ICD-10-CM

## 2013-04-18 LAB — URINALYSIS, ROUTINE W REFLEX MICROSCOPIC
Bilirubin Urine: NEGATIVE
Ketones, ur: 15 — AB
Leukocytes, UA: NEGATIVE
Nitrite: POSITIVE — AB
PH: 6 (ref 5.0–8.0)
Specific Gravity, Urine: 1.025 (ref 1.000–1.030)
TOTAL PROTEIN, URINE-UPE24: NEGATIVE
URINE GLUCOSE: NEGATIVE
Urobilinogen, UA: 0.2 (ref 0.0–1.0)

## 2013-04-18 LAB — HEPATIC FUNCTION PANEL
ALT: 17 U/L (ref 0–35)
AST: 19 U/L (ref 0–37)
Albumin: 4 g/dL (ref 3.5–5.2)
Alkaline Phosphatase: 69 U/L (ref 39–117)
BILIRUBIN DIRECT: 0.1 mg/dL (ref 0.0–0.3)
BILIRUBIN TOTAL: 1.1 mg/dL (ref 0.3–1.2)
Total Protein: 7 g/dL (ref 6.0–8.3)

## 2013-04-18 LAB — CBC WITH DIFFERENTIAL/PLATELET
BASOS PCT: 0.3 % (ref 0.0–3.0)
Basophils Absolute: 0 10*3/uL (ref 0.0–0.1)
EOS ABS: 0.1 10*3/uL (ref 0.0–0.7)
EOS PCT: 0.7 % (ref 0.0–5.0)
HCT: 41.6 % (ref 36.0–46.0)
HEMOGLOBIN: 13.6 g/dL (ref 12.0–15.0)
LYMPHS PCT: 23.5 % (ref 12.0–46.0)
Lymphs Abs: 1.9 10*3/uL (ref 0.7–4.0)
MCHC: 32.8 g/dL (ref 30.0–36.0)
MCV: 84 fl (ref 78.0–100.0)
Monocytes Absolute: 0.4 10*3/uL (ref 0.1–1.0)
Monocytes Relative: 5.2 % (ref 3.0–12.0)
NEUTROS ABS: 5.8 10*3/uL (ref 1.4–7.7)
Neutrophils Relative %: 70.3 % (ref 43.0–77.0)
Platelets: 293 10*3/uL (ref 150.0–400.0)
RBC: 4.95 Mil/uL (ref 3.87–5.11)
RDW: 14.1 % (ref 11.5–14.6)
WBC: 8.2 10*3/uL (ref 4.5–10.5)

## 2013-04-18 LAB — BASIC METABOLIC PANEL
BUN: 8 mg/dL (ref 6–23)
CALCIUM: 9 mg/dL (ref 8.4–10.5)
CO2: 26 mEq/L (ref 19–32)
Chloride: 107 mEq/L (ref 96–112)
Creatinine, Ser: 0.7 mg/dL (ref 0.4–1.2)
GFR: 108.41 mL/min (ref >60.00)
Glucose, Bld: 153 mg/dL — ABNORMAL HIGH (ref 70–99)
Potassium: 3.1 mEq/L — ABNORMAL LOW (ref 3.5–5.1)
Sodium: 140 mEq/L (ref 135–145)

## 2013-04-18 LAB — LIPID PANEL
Cholesterol: 204 mg/dL — ABNORMAL HIGH (ref 0–200)
HDL: 79.2 mg/dL (ref >39.00)
Total CHOL/HDL Ratio: 3
Triglycerides: 78 mg/dL (ref 0.0–149.0)
VLDL: 15.6 mg/dL (ref 0.0–40.0)

## 2013-04-18 LAB — LDL CHOLESTEROL, DIRECT: LDL DIRECT: 112.9 mg/dL

## 2013-04-18 LAB — TSH: TSH: 0.82 u[IU]/mL (ref 0.35–5.50)

## 2013-04-18 MED ORDER — ESCITALOPRAM OXALATE 10 MG PO TABS: 10.0000 mg | ORAL_TABLET | Freq: Every day | ORAL | Status: DC

## 2013-04-18 MED ORDER — OMEPRAZOLE 20 MG PO CPDR: 20.0000 mg | DELAYED_RELEASE_CAPSULE | Freq: Two times a day (BID) | ORAL | Status: DC

## 2013-04-18 MED ORDER — DIAZEPAM 5 MG PO TABS: 5.0000 mg | ORAL_TABLET | Freq: Every day | ORAL | Status: DC

## 2013-04-18 NOTE — Patient Instructions (Signed)
Please take all new medication as prescribed - the lexapro 10 mg per day Please continue all other medications as before, and refills have been done if requested- the valium, and prilosec Please continue your efforts at being more active, low cholesterol diet, and weight control. You are otherwise up to date with prevention measures today.  You will be contacted regarding the referral for: diagnostic mammogram and ultrasound  Please go to the LAB in the Basement (turn left off the elevator) for the tests to be done today You will be contacted by phone if any changes need to be made immediately.  Otherwise, you will receive a letter about your results with an explanation, but please check with MyChart first.  Please keep your appointments with your specialists as you may have planned  Please return in 1 year for your yearly visit, or sooner if needed

## 2013-04-18 NOTE — Assessment & Plan Note (Signed)
Also for a1c 

## 2013-04-18 NOTE — Assessment & Plan Note (Signed)
Pt now with insurance, will ask for diag mammogram with ultrasnoud

## 2013-04-18 NOTE — Assessment & Plan Note (Signed)

## 2013-04-18 NOTE — Progress Notes (Signed)
Pre-visit discussion using our clinic review tool. No additional management support is needed unless otherwise documented below in the visit note.  

## 2013-04-18 NOTE — Assessment & Plan Note (Signed)
Zap for valium as before prn, try to minimize

## 2013-04-18 NOTE — Progress Notes (Signed)
Subjective:    Patient ID: Alejandra Harmon, female    DOB: [birth year], 53 y.o.   MRN: [id number]  HPI  Here for wellness and f/u;  Overall doing ok;  Pt denies CP, worsening SOB, DOE, wheezing, orthopnea, PND, worsening LE edema, palpitations, dizziness or syncope.  Pt denies neurological change such as new headache, facial or extremity weakness.  Pt denies polydipsia, polyuria, or low sugar symptoms. Pt states overall good compliance with treatment and medications, good tolerability, and has been trying to follow lower cholesterol diet.  Pt denies worsening depressive symptoms, suicidal ideation or panic. No fever, night sweats, wt loss, loss of appetite, or other constitutional symptoms.  Pt states good ability with ADL's, has low fall risk, home safety reviewed and adequate, no other significant changes in hearing or vision, and only occasionally active with exercise. Also - with left breast pain for several wks, points to a localized area left mid breast approx 2 cm above the areola. No mass, sweling, rash noted.  2 sister, and mother with breast cancer. 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 Has had worsening depressive symptoms, but no suicidal ideation, or panic; has ongoing anxiety, not increased recently.  Past Medical History  Diagnosis Date  . ANXIETY 07/03/2007  . DEPRESSION 07/03/2007  . OBSTRUCTIVE SLEEP APNEA 08/19/2009  . ALLERGIC RHINITIS 07/03/2007  . GERD 08/31/2009  . BREAST HYPERTROPHY 10/30/2008  . KNEE PAIN, RIGHT 05/26/2009  . BACK PAIN 10/30/2008  . Right thyroid nodule    Past Surgical History  Procedure Laterality Date  . Cholecystectomy    . S/p c-section    . S/p breast cyst  1980  . Right elbow  2005    Right elbow ulnar nerve decompression  . Abdominal adhesion surgery  2005    Lysis of adhesion and right ovary removal  .  Bilateral reduction mammoplasty  2010    reports that she has never smoked. She has never used smokeless tobacco. She reports that she does not drink alcohol or use illicit drugs. family history includes Breast cancer in her mother, sister, and sister; Cancer in her other; Diabetes in her brother, brother, brother, brother, brother, brother, mother, other, sister, sister, and sister; Hypertension in her mother and other. Allergies  Allergen Reactions  . Bactrim [Sulfamethoxazole-Trimethoprim]    No current outpatient prescriptions on file prior to visit.   No current facility-administered medications on file prior to visit.    Review of Systems Constitutional: Negative for diaphoresis, activity change, appetite change or unexpected weight change.  HENT: Negative for hearing loss, ear pain, facial swelling, mouth sores and neck stiffness.   Eyes: Negative for pain, redness and visual disturbance.  Respiratory: Negative for shortness of breath and wheezing.   Cardiovascular: Negative for chest pain and palpitations.  Gastrointestinal: Negative for diarrhea, blood in stool, abdominal distention or other pain Genitourinary: Negative for hematuria, flank pain or change in urine volume.  Musculoskeletal: Negative for myalgias and joint swelling.  Skin: Negative for color change and wound.  Neurological: Negative for syncope and numbness. other than noted Hematological: Negative for adenopathy.  Psychiatric/Behavioral: Negative for hallucinations, self-injury, decreased concentration and agitation.      Objective:   Physical Exam BP 130/90  Pulse 75  Temp(Src) 98.1 F (36.7 C) (Oral)  Ht 5\' 2"  (1.575 m)  Wt 204 lb 8 oz (  92.761 kg)  BMI 37.39 kg/m2  SpO2 98% VS noted,  Constitutional: Pt is oriented to person, place, and time. Appears well-developed and well-nourished.  Head: Normocephalic and atraumatic.  Right Ear: External ear normal.  Left Ear: External ear normal.  Nose: Nose  normal.  Mouth/Throat: Oropharynx is clear and moist.  Eyes: Conjunctivae and EOM are normal. Pupils are equal, round, and reactive to light.  Neck: Normal range of motion. Neck supple. No JVD present. No tracheal deviation present.  Left and right breast NT without mass appreciated, no overlying skin change, no axillary sweling or nodules and nipples without d/c;  Is s/p bilat breast reduction Cardiovascular: Normal rate, regular rhythm, normal heart sounds and intact distal pulses.   Pulmonary/Chest: Effort normal and breath sounds normal.  Abdominal: Soft. Bowel sounds are normal. There is no tenderness. No HSM  Musculoskeletal: Normal range of motion. Exhibits no edema.  Lymphadenopathy:  Has no cervical adenopathy.  Neurological: Pt is alert and oriented to person, place, and time. Pt has normal reflexes. No cranial nerve deficit.  Skin: Skin is warm and dry. No rash noted.  Psychiatric:  Has  2+ nervous, and depressed mood and affect. Behavior is normal.      Assessment & Plan:

## 2013-04-18 NOTE — Assessment & Plan Note (Signed)
I was not able to apprec this today

## 2013-04-18 NOTE — Assessment & Plan Note (Signed)
To start lexapro 10 qd, also f/u with hospice for counseling as well

## 2013-04-19 ENCOUNTER — Other Ambulatory Visit: Payer: Self-pay | Admitting: Internal Medicine

## 2013-04-19 DIAGNOSIS — N644 Mastodynia: Secondary | ICD-10-CM

## 2013-04-19 DIAGNOSIS — N6313 Unspecified lump in the right breast, lower outer quadrant: Secondary | ICD-10-CM

## 2013-04-30 ENCOUNTER — Other Ambulatory Visit: Payer: No Typology Code available for payment source

## 2013-05-09 ENCOUNTER — Ambulatory Visit
Admission: RE | Admit: 2013-05-09 | Discharge: 2013-05-09 | Disposition: A | Discharge status: Home / Self Care | Payer: No Typology Code available for payment source | Source: Ambulatory Visit | Attending: Internal Medicine | Admitting: Internal Medicine

## 2013-05-09 ENCOUNTER — Other Ambulatory Visit: Payer: Self-pay | Admitting: Internal Medicine

## 2013-05-09 DIAGNOSIS — N644 Mastodynia: Secondary | ICD-10-CM

## 2013-05-09 DIAGNOSIS — N6313 Unspecified lump in the right breast, lower outer quadrant: Secondary | ICD-10-CM

## 2013-05-13 ENCOUNTER — Telehealth: Payer: Self-pay | Admitting: *Deleted

## 2013-05-13 NOTE — Telephone Encounter (Signed)
Patient phoned requesting recent test results-reviewed with her letter that PCP sent.

## 2013-05-17 ENCOUNTER — Other Ambulatory Visit: Payer: Self-pay | Admitting: Internal Medicine

## 2013-05-17 ENCOUNTER — Ambulatory Visit
Admission: RE | Admit: 2013-05-17 | Discharge: 2013-05-17 | Disposition: A | Discharge status: Home / Self Care | Payer: No Typology Code available for payment source | Source: Ambulatory Visit | Attending: Internal Medicine | Admitting: Internal Medicine

## 2013-05-17 DIAGNOSIS — N6320 Unspecified lump in the left breast, unspecified quadrant: Secondary | ICD-10-CM

## 2013-05-17 DIAGNOSIS — N6313 Unspecified lump in the right breast, lower outer quadrant: Secondary | ICD-10-CM

## 2013-05-17 DIAGNOSIS — N644 Mastodynia: Secondary | ICD-10-CM

## 2013-05-22 ENCOUNTER — Encounter: Payer: Self-pay | Admitting: Genetic Counselor

## 2013-05-22 ENCOUNTER — Other Ambulatory Visit: Payer: No Typology Code available for payment source

## 2013-05-22 ENCOUNTER — Ambulatory Visit (HOSPITAL_BASED_OUTPATIENT_CLINIC_OR_DEPARTMENT_OTHER): Payer: No Typology Code available for payment source | Admitting: Genetic Counselor

## 2013-05-22 DIAGNOSIS — Z803 Family history of malignant neoplasm of breast: Secondary | ICD-10-CM

## 2013-05-22 DIAGNOSIS — Z8042 Family history of malignant neoplasm of prostate: Secondary | ICD-10-CM

## 2013-05-22 NOTE — Progress Notes (Signed)
Dr.  Jenny Reichmann, Hunt Oris, MD requested a consultation for genetic counseling and risk assessment for Alejandra Harmon, a 53 y.o. female, for discussion of her family history of breast cancer and known PALB2 familial mutation.  She presents to clinic today to discuss the possibility of a genetic predisposition to cancer, and to further clarify her risks, as well as her family members' risks for cancer.   HISTORY OF PRESENT ILLNESS:  Alejandra Harmon is a 53 y.o. female with no personal history of cancer.  She reports having a breast cyst that was biopsied when she was 17 that was benign.  She has had a sigmoidoscopy that was negative.  Past Medical History  Diagnosis Date  . ANXIETY 07/03/2007  . DEPRESSION 07/03/2007  . OBSTRUCTIVE SLEEP APNEA 08/19/2009  . ALLERGIC RHINITIS 07/03/2007  . GERD 08/31/2009  . BREAST HYPERTROPHY 10/30/2008  . KNEE PAIN, RIGHT 05/26/2009  . BACK PAIN 10/30/2008  . Right thyroid nodule     Past Surgical History  Procedure Laterality Date  . Cholecystectomy    . S/p c-section    . S/p breast cyst  1980  . Right elbow  2005    Right elbow ulnar nerve decompression  . Abdominal adhesion surgery  2005    Lysis of adhesion and right ovary removal  . Bilateral reduction mammoplasty  2010    History   Social History  . Marital Status: Divorced    Spouse Name: N/A    Number of Children: 1  . Years of Education: N/A   Occupational History  . 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 Smoking status: Never Smoker   . Smokeless tobacco: Never Used  . Alcohol Use: No  . Drug Use: No  . Sexual Activity: Not Currently   Other Topics Concern  . None   Social History Narrative  . None    REPRODUCTIVE HISTORY AND PERSONAL RISK ASSESSMENT FACTORS: Menarche was at age 74.   perimenopausal Uterus Intact: yes Ovaries Intact: yes G1P1A0, first live birth at age 53  She has not previously undergone treatment for  infertility.   Oral Contraceptive use: 3 years   She has not used HRT in the past.    FAMILY HISTORY:  We obtained a detailed, 4-generation family history.  Significant diagnoses are listed below: Family History  Problem Relation Age of Onset  . Diabetes Other   . Hypertension Other   . Cancer Other     ovarian cancer  . Diabetes Mother   . Hypertension Mother   . Breast cancer Mother 8  . Renal cancer Mother 30  . Breast cancer Sister 89  . Diabetes Brother   . Leukemia Brother   . Diabetes Brother   . Prostate cancer Brother 96  . Diabetes Brother   . Prostate cancer Brother 71  . Diabetes Brother   . Diabetes Brother   . Diabetes Brother   . Diabetes Sister   . Diabetes Sister   . Diabetes Sister   . Breast cancer Sister 35    PALB2+  . Bone cancer Maternal Aunt   . Prostate cancer Maternal Uncle   . Prostate cancer Paternal Uncle   . Breast cancer Maternal Aunt 75    Patient's maternal ancestors are of [place] American and Caucasian descent, and paternal ancestors are of [place], Caucasian and [place] descent. There is no reported Ashkenazi Jewish ancestry. There is  no known consanguinity.  GENETIC COUNSELING ASSESSMENT: MARVETTE SCHAMP is a 53 y.o. female with a family history of breast cancer and known familial PALB2 mutation. We, therefore, discussed and recommended the following at today's visit.   DISCUSSION: We reviewed the characteristics, features and inheritance patterns of hereditary cancer syndromes. We also discussed genetic testing, including the appropriate family members to test, the process of testing, insurance coverage and turn-around-time for results. We reviewed autosomal dominant inheritance, natural history of hereditary breast/pancreatic cancer syndrome. Based on her sister's testing positive for a PALB2 mutation, she is at 50% risk for also carrying a mutation.  We discussed that [place] is thought to be a moderate risk gene, and  to date most of the literature conveys a risk for breast cancer of about 25-40%. A recent paper came out that suggests that the risk for breast cancer may be an average of 35% lifetime risk and based on family history could be higher (Antoniou, NEJM [phone]).  We discussed that this is one paper, and would need to be replicated, but that it is also the largest to date. We reviewed the risk for other cancers in carriers for PALB2 mutations, including including female breast cancer, prostate, ovarian and pancreatic cancer, although these risks have not yet been quantified. A discussion of the utility of a breast MRI, and clinical breast exams, vs. Risk reducing surgery also occurred. We also reviewed that since this is a moderate risk gene, there are probably other factors that go into a risk for breast cancer in her family.  Therefore, if she is negative, we cannot be fully reassuring that her risk for breast cancer is back to population risk.  However, her risk for other cancers are probably not elevated.  Based on insurance coverage of PALB2 single site testing vs. Full panel testing, it may be more cost effective to pursue full panel testing.  This will also allow her to fully understand if there are other hereditary risks for breast cancer.  PLAN: After considering the risks, benefits, and limitations, Aby T Santino provided informed consent to pursue genetic testing and the blood sample will be sent to [facility] for analysis of the [facility]. We discussed the implications of a positive, negative and/ or variant of uncertain significance genetic test result. Results should be available within approximately 3 weeks' time, at which point they will be disclosed by telephone to Pryor Curia, as will any additional recommendations warranted by these results. Lenor Derrick Eschbach will receive a summary of her genetic counseling visit and a copy of her results once available. This information will  also be available in Epic. We encouraged Alejandra Harmon to remain in contact with cancer genetics annually so that we can continuously update the family history and inform her of any changes in cancer genetics and testing that may be of benefit for her family. Edwena Blow T Szeto's questions were answered to her satisfaction today. Our contact information was provided should additional questions or concerns arise.  The patient was seen for a total of 60 minutes, greater than 50% of which was spent face-to-face counseling.  This note will also be sent to the referring provider via the electronic medical record. The patient will be supplied with a summary of this genetic counseling discussion as well as educational information on the discussed hereditary cancer syndromes following the conclusion of their visit.   Patient was discussed with Dr. Marcy Panning.   _______________________________________________________________________ For Office Staff:  Number  of people involved in session: 2 Was an Intern/ student involved with case: yes

## 2013-05-28 ENCOUNTER — Telehealth: Payer: Self-pay

## 2013-05-28 NOTE — Telephone Encounter (Signed)
The patient called and is hoping to speak with the CMA as soon as possible.  She states the matter was personal...   Thanks!

## 2013-05-28 NOTE — Telephone Encounter (Signed)
Called left message to call back 

## 2013-05-29 NOTE — Telephone Encounter (Signed)
Called the patient and she is requesting a prescription for Phentermine.  States PCP prescribed it for her around 2010.  States she would like to try at least for one month.  Did inform PCP out of the office until Friday 05/31/13.  She stated she can wait until then for a response as this is certainly not an emergency.

## 2013-05-30 NOTE — Telephone Encounter (Signed)
Very sorry, but as phentermine is a short term medication and has has already tried once about 2010, it is very unlikely to cause permanent wt loss, so I would not recommend

## 2013-05-31 NOTE — Telephone Encounter (Signed)
Patient informed of  MD's response for request.

## 2013-06-05 ENCOUNTER — Encounter: Payer: Self-pay | Admitting: Genetic Counselor

## 2013-06-05 ENCOUNTER — Telehealth: Payer: Self-pay | Admitting: Genetic Counselor

## 2013-06-05 NOTE — Telephone Encounter (Signed)
Revealed positive PALB2 mutation.  Discussed risk with her.  She is not interested in coming in right now for counseling.  I will send her the results of her testing and a letter summarizing this.

## 2013-06-28 ENCOUNTER — Encounter: Payer: Self-pay | Admitting: Internal Medicine

## 2013-07-01 ENCOUNTER — Other Ambulatory Visit: Payer: No Typology Code available for payment source

## 2013-07-01 ENCOUNTER — Encounter: Payer: No Typology Code available for payment source | Admitting: Genetic Counselor

## 2013-07-03 ENCOUNTER — Ambulatory Visit (INDEPENDENT_AMBULATORY_CARE_PROVIDER_SITE_OTHER): Payer: No Typology Code available for payment source | Admitting: Internal Medicine

## 2013-07-03 ENCOUNTER — Encounter: Payer: Self-pay | Admitting: Internal Medicine

## 2013-07-03 VITALS — BP 112/80 | HR 93 | Temp 98.6°F | Ht 62.0 in | Wt 206.0 lb

## 2013-07-03 DIAGNOSIS — M25569 Pain in unspecified knee: Secondary | ICD-10-CM

## 2013-07-03 DIAGNOSIS — F329 Major depressive disorder, single episode, unspecified: Secondary | ICD-10-CM

## 2013-07-03 DIAGNOSIS — F411 Generalized anxiety disorder: Secondary | ICD-10-CM

## 2013-07-03 DIAGNOSIS — M25562 Pain in left knee: Secondary | ICD-10-CM

## 2013-07-03 DIAGNOSIS — F3289 Other specified depressive episodes: Secondary | ICD-10-CM

## 2013-07-03 MED ORDER — NAPROXEN 500 MG PO TABS: 500.0000 mg | ORAL_TABLET | Freq: Two times a day (BID) | ORAL | Status: DC

## 2013-07-03 MED ORDER — DIAZEPAM 5 MG PO TABS
5.0000 mg | ORAL_TABLET | Freq: Every day | ORAL | Status: DC
Start: 2013-07-03 — End: 2013-09-25

## 2013-07-03 MED ORDER — ESCITALOPRAM OXALATE 10 MG PO TABS: 10.0000 mg | ORAL_TABLET | Freq: Every day | ORAL | Status: DC

## 2013-07-03 MED ORDER — OMEPRAZOLE 20 MG PO CPDR: 20.0000 mg | DELAYED_RELEASE_CAPSULE | Freq: Two times a day (BID) | ORAL | Status: DC

## 2013-07-03 NOTE — Progress Notes (Signed)
Pre visit review using our clinic review tool, if applicable. No additional management support is needed unless otherwise documented below in the visit note. 

## 2013-07-03 NOTE — Patient Instructions (Signed)
Please take all new medication as prescribed - the anti-inflammatory if needed for pain  OK to take the lexapro  Please continue all other medications as before, and refills have been done if requested - the valium  Please have the pharmacy call with any other refills you may need.  You will be contacted regarding the referral for: counseling

## 2013-07-04 NOTE — Assessment & Plan Note (Signed)
Hana for valium refill, prn use only,  to f/u any worsening symptoms or concerns, declines counseling referral
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
## 2013-07-04 NOTE — Assessment & Plan Note (Signed)
stable overall by history and exam, recent data reviewed with pt, and pt to continue medical treatment as before,  to f/u any worsening symptoms or concerns Lab Results  Component Value Date   WBC 8.2 04/18/2013   HGB 13.6 04/18/2013   HCT 41.6 04/18/2013   PLT 293.0 04/18/2013   GLUCOSE 153* 04/18/2013   CHOL 204* 04/18/2013   TRIG 78.0 04/18/2013   HDL 79.20 04/18/2013   LDLDIRECT 112.9 04/18/2013   LDLCALC 101* 05/28/2010   ALT 17 04/18/2013   AST 19 04/18/2013   NA 140 04/18/2013   K 3.1* 04/18/2013   CL 107 04/18/2013   CREATININE 0.7 04/18/2013   BUN 8 04/18/2013   CO2 26 04/18/2013   TSH 0.82 04/18/2013   INR 0.96 07/09/2009   HGBA1C  Value: 5.8 (NOTE)                                                                       According to the ADA Clinical Practice Recommendations for 2011, when HbA1c is used as a screening test:   >=6.5%   Diagnostic of Diabetes Mellitus           (if abnormal result  is confirmed)  5.7-6.4%   Increased risk of developing Diabetes Mellitus  References:Diagnosis and Classification of Diabetes Mellitus,Diabetes LKTG,2563,89(HTDSK 1):S62-S69 and Standards of Medical Care in         Diabetes - 2011,Diabetes Care,2011,34  (Suppl 1):S11-S61.* 07/09/2009
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
## 2013-08-26 ENCOUNTER — Ambulatory Visit (INDEPENDENT_AMBULATORY_CARE_PROVIDER_SITE_OTHER): Payer: No Typology Code available for payment source | Admitting: Internal Medicine

## 2013-08-26 ENCOUNTER — Encounter: Payer: Self-pay | Admitting: Internal Medicine

## 2013-08-26 VITALS — BP 92/74 | HR 70 | Temp 98.2°F | Resp 14 | Wt 199.4 lb

## 2013-08-26 DIAGNOSIS — J040 Acute laryngitis: Secondary | ICD-10-CM

## 2013-08-26 DIAGNOSIS — J209 Acute bronchitis, unspecified: Secondary | ICD-10-CM

## 2013-08-26 MED ORDER — AZITHROMYCIN 250 MG PO TABS
ORAL_TABLET | ORAL | Status: DC
Start: 2013-08-26 — End: 2013-09-25

## 2013-08-26 MED ORDER — PREDNISONE 20 MG PO TABS: 20.0000 mg | ORAL_TABLET | Freq: Two times a day (BID) | ORAL | Status: DC

## 2013-08-26 MED ORDER — HYDROCODONE-HOMATROPINE 5-1.5 MG/5ML PO SYRP: 5.0000 mL | ORAL_SOLUTION | Freq: Four times a day (QID) | ORAL | Status: DC | PRN

## 2013-08-26 NOTE — Patient Instructions (Signed)
Carry room temperature water and sip liberally after coughing. 

## 2013-08-26 NOTE — Progress Notes (Signed)
Pre visit review using our clinic review tool, if applicable. No additional management support is needed unless otherwise documented below in the visit note. 

## 2013-08-27 ENCOUNTER — Telehealth: Payer: Self-pay

## 2013-08-27 NOTE — Progress Notes (Signed)
   Subjective:    Patient ID: Alejandra Harmon, female    DOB: [birth year], 53 y.o.   MRN: [id number]  HPI Symptoms began Saturday 08/17/13 as chest congestion &  laryngitis after exposure to air conditioning @ work. The symptoms have remained essentially stable but she will have paroxysmal cough lasting five - 15 minutes associated with chest discomfort. It is nonproductive with only scant green sputum. Hot tea with lemon and honey has been of some benefit. She's had some itchy, watery eyes. She's had chills as well as sweats. She describes some sneezing. She describes shortness of breath. She has no history of asthma some cultures never smoked.    Review of Systems She specifically denies fever; classic pleuritic chest pain; nasal purulence; facial pain; frontal headache; or wheezing. Reflux symptoms are not significant.        Objective:   Physical Exam General appearance:good health ;well nourished; no acute distress or increased work of breathing is present.  No  lymphadenopathy about the head, neck, or axilla noted.   Eyes: No conjunctival inflammation or lid edema is present. There is no scleral icterus.  Ears:  External ear exam shows no significant lesions or deformities.  Otoscopic examination reveals clear canals, tympanic membranes are intact bilaterally without bulging, retraction, inflammation or discharge.  Nose:  External nasal examination shows no deformity or inflammation. Nasal mucosa are pink and moist without lesions or exudates. No septal dislocation or deviation.No obstruction to airflow.   Oral exam: Dental hygiene is good; lips and gums are healthy appearing.There is no oropharyngeal erythema or exudate noted. Very hoarse.  Neck:  No deformities, thyromegaly, masses, or tenderness noted.   Supple with full range of motion without pain.   Heart:  Normal rate and regular rhythm. S1 and S2 normal without gallop, murmur, click, rub or other extra sounds.   Lungs:Chest  clear to auscultation; no wheezes, rhonchi,rales ,or rubs present.No increased work of breathing. Horrific brassy cough intermittently.  Extremities:  No cyanosis, edema, or clubbing  noted    Skin: Warm & dry w/o jaundice or tenting.         Assessment & Plan:  #1 acute bronchitis w/o bronchospasm #2 laryngitis Plan: See orders and recommendations

## 2013-08-27 NOTE — Telephone Encounter (Signed)
Will forward to PCP 

## 2013-08-27 NOTE — Telephone Encounter (Signed)
That is possible, but hard to say, since the current computer records regarding medication only go back 3 yrs

## 2013-08-27 NOTE — Telephone Encounter (Signed)
Patient informed of MD's response.

## 2013-08-27 NOTE — Telephone Encounter (Signed)
Message copied by Jamesetta Orleans on Tue Aug 27, 2013 12:11 PM ------      Message from: Hendricks Limes      Created: Tue Aug 27, 2013  8:23 AM       She was seen 08/26/13; she was inquiring as to whether she ever been on Lexapro. I cannot find it in the records. She thought it was 2011. ------

## 2013-09-25 ENCOUNTER — Emergency Department (HOSPITAL_COMMUNITY): Payer: No Typology Code available for payment source

## 2013-09-25 ENCOUNTER — Encounter (HOSPITAL_COMMUNITY): Payer: Self-pay | Admitting: Emergency Medicine

## 2013-09-25 ENCOUNTER — Ambulatory Visit (INDEPENDENT_AMBULATORY_CARE_PROVIDER_SITE_OTHER): Payer: No Typology Code available for payment source | Admitting: Emergency Medicine

## 2013-09-25 ENCOUNTER — Emergency Department (HOSPITAL_COMMUNITY)
Admission: EM | Admit: 2013-09-25 | Discharge: 2013-09-25 | Disposition: A | Discharge status: Home / Self Care | Payer: No Typology Code available for payment source | Attending: Emergency Medicine | Admitting: Emergency Medicine

## 2013-09-25 VITALS — BP 154/98 | HR 79 | Temp 98.1°F | Resp 16

## 2013-09-25 DIAGNOSIS — R079 Chest pain, unspecified: Secondary | ICD-10-CM | POA: Insufficient documentation

## 2013-09-25 DIAGNOSIS — R002 Palpitations: Secondary | ICD-10-CM

## 2013-09-25 DIAGNOSIS — R110 Nausea: Secondary | ICD-10-CM | POA: Insufficient documentation

## 2013-09-25 DIAGNOSIS — R0602 Shortness of breath: Secondary | ICD-10-CM | POA: Insufficient documentation

## 2013-09-25 DIAGNOSIS — R05 Cough: Secondary | ICD-10-CM | POA: Insufficient documentation

## 2013-09-25 DIAGNOSIS — Z8709 Personal history of other diseases of the respiratory system: Secondary | ICD-10-CM | POA: Insufficient documentation

## 2013-09-25 DIAGNOSIS — Z862 Personal history of diseases of the blood and blood-forming organs and certain disorders involving the immune mechanism: Secondary | ICD-10-CM | POA: Insufficient documentation

## 2013-09-25 DIAGNOSIS — Z8742 Personal history of other diseases of the female genital tract: Secondary | ICD-10-CM | POA: Insufficient documentation

## 2013-09-25 DIAGNOSIS — Z79899 Other long term (current) drug therapy: Secondary | ICD-10-CM | POA: Insufficient documentation

## 2013-09-25 DIAGNOSIS — F411 Generalized anxiety disorder: Secondary | ICD-10-CM | POA: Insufficient documentation

## 2013-09-25 DIAGNOSIS — R0789 Other chest pain: Secondary | ICD-10-CM

## 2013-09-25 DIAGNOSIS — Z8639 Personal history of other endocrine, nutritional and metabolic disease: Secondary | ICD-10-CM | POA: Insufficient documentation

## 2013-09-25 DIAGNOSIS — R071 Chest pain on breathing: Secondary | ICD-10-CM

## 2013-09-25 DIAGNOSIS — M129 Arthropathy, unspecified: Secondary | ICD-10-CM | POA: Insufficient documentation

## 2013-09-25 DIAGNOSIS — Z8669 Personal history of other diseases of the nervous system and sense organs: Secondary | ICD-10-CM | POA: Insufficient documentation

## 2013-09-25 DIAGNOSIS — F419 Anxiety disorder, unspecified: Secondary | ICD-10-CM

## 2013-09-25 DIAGNOSIS — K219 Gastro-esophageal reflux disease without esophagitis: Secondary | ICD-10-CM | POA: Insufficient documentation

## 2013-09-25 DIAGNOSIS — Z7982 Long term (current) use of aspirin: Secondary | ICD-10-CM | POA: Insufficient documentation

## 2013-09-25 DIAGNOSIS — R059 Cough, unspecified: Secondary | ICD-10-CM | POA: Insufficient documentation

## 2013-09-25 LAB — BASIC METABOLIC PANEL
Anion gap: 12 (ref 5–15)
BUN: 9 mg/dL (ref 6–23)
CO2: 25 mEq/L (ref 19–32)
Calcium: 8.8 mg/dL (ref 8.4–10.5)
Chloride: 105 mEq/L (ref 96–112)
Creatinine, Ser: 0.7 mg/dL (ref 0.50–1.10)
GFR calc Af Amer: >90 mL/min (ref >90)
GFR calc non Af Amer: >90 mL/min (ref >90)
Glucose, Bld: 117 mg/dL — ABNORMAL HIGH (ref 70–99)
Potassium: 4 mEq/L (ref 3.7–5.3)
Sodium: 142 mEq/L (ref 137–147)

## 2013-09-25 LAB — CBC
HCT: 39.2 % (ref 36.0–46.0)
Hemoglobin: 12.5 g/dL (ref 12.0–15.0)
MCH: 27.1 pg (ref 26.0–34.0)
MCHC: 31.9 g/dL (ref 30.0–36.0)
MCV: 84.8 fL (ref 78.0–100.0)
Platelets: 285 10*3/uL (ref 150–400)
RBC: 4.62 MIL/uL (ref 3.87–5.11)
RDW: 13.6 % (ref 11.5–15.5)
WBC: 5.4 10*3/uL (ref 4.0–10.5)

## 2013-09-25 LAB — D-DIMER, QUANTITATIVE (NOT AT ARMC): D-Dimer, Quant: <0.27 ug/mL-FEU (ref 0.00–0.48)

## 2013-09-25 LAB — TROPONIN I: Troponin I: <0.3 ng/mL (ref <0.30)

## 2013-09-25 MED ORDER — NITROGLYCERIN 0.3 MG SL SUBL
0.4000 mg | SUBLINGUAL_TABLET | Freq: Once | SUBLINGUAL | Status: AC
Start: 2013-09-25 — End: 2013-09-25
  Administered 2013-09-25: 0.3 mg via SUBLINGUAL

## 2013-09-25 MED ORDER — ASPIRIN 81 MG PO CHEW
243.0000 mg | CHEWABLE_TABLET | Freq: Once | ORAL | Status: AC
  Administered 2013-09-25: 243 mg via ORAL

## 2013-09-25 NOTE — ED Notes (Signed)
Pt needed to go to restroom.

## 2013-09-25 NOTE — ED Provider Notes (Signed)
CSN: [id number]     Arrival date & time 09/25/13  1008 History   First MD Initiated Contact with Patient 09/25/13 1012     Chief Complaint  Patient presents with  . Chest Pain     (Consider location/radiation/quality/duration/timing/severity/associated sxs/prior Treatment) HPI Pt is a 53yo female brought to ED via EMS from [facility] for further evaluation of mid-sternal chest pain that started around 7AM this morning. There is concern for PE as pt reports recent car ride to and from [place].  Pain is aching and dull, intermittent, states only 1/10 at this time. Pt received a total of $Remove'324mg'CAItTsH$  ASA and 2 NTG prior to arrival in ED.  Pt states she has had SOB and felt like her heart was racing when symptoms started but states she only has some hand cramping and nausea now. States she believes nausea is from medication as she has not eaten this morning.  Reports hx of anxiety but stopped taking her medication. She was taking generic lexapro.  Reports symptoms started while pt was at rest, sitting at home.  Denies recent illness but does report recent tx for acid reflux that has caused a cough and hoarse voice. Denies SOB or CP at this time.   Past Medical History  Diagnosis Date  . ANXIETY 07/03/2007  . DEPRESSION 07/03/2007  . OBSTRUCTIVE SLEEP APNEA 08/19/2009  . ALLERGIC RHINITIS 07/03/2007  . GERD 08/31/2009  . BREAST HYPERTROPHY 10/30/2008  . KNEE PAIN, RIGHT 05/26/2009  . BACK PAIN 10/30/2008  . Right thyroid nodule    Past Surgical History  Procedure Laterality Date  . Cholecystectomy    . S/p c-section    . S/p breast cyst  1980  . Right elbow  2005    Right elbow ulnar nerve decompression  . Abdominal adhesion surgery  2005    Lysis of adhesion and right ovary removal  . Bilateral reduction mammoplasty  2010   Family History  Problem Relation Age of Onset  . Diabetes Other   . Hypertension Other   . Cancer Other     ovarian cancer  . Diabetes Mother   . Hypertension Mother   . Breast  cancer Mother 1  . Renal cancer Mother 1  . Breast cancer Sister 67  . Diabetes Brother   . Leukemia Brother   . Diabetes Brother   . Prostate cancer Brother 17  . Diabetes Brother   . Prostate cancer Brother 51  . Diabetes Brother   . Diabetes Brother   . Diabetes Brother   . Diabetes Sister   . Diabetes Sister   . Diabetes Sister   . Breast cancer Sister 24    PALB2+  . Bone cancer Maternal Aunt   . Prostate cancer Maternal Uncle   . Prostate cancer Paternal Uncle   . Breast cancer Maternal Aunt 45   History  Substance Use Topics  . Smoking status: Never Smoker   . Smokeless tobacco: Never Used  . Alcohol Use: No   OB History   Grav Para Term Preterm Abortions TAB SAB Ect Mult Living   '3 1 1  2 2    1     '$ Review of Systems  Constitutional: Negative for fever, chills and diaphoresis.  HENT: Positive for voice change ( hoarse "from reflux"). Negative for sore throat.   Respiratory: Positive for cough and shortness of breath. Negative for wheezing and stridor.   Cardiovascular: Positive for chest pain and palpitations ( heart racing). Negative for leg  swelling.  Gastrointestinal: Positive for nausea. Negative for vomiting, abdominal pain and diarrhea.  Musculoskeletal: Positive for myalgias. Negative for back pain.  All other systems reviewed and are negative.     Allergies  Bactrim  Home Medications   Prior to Admission medications   Medication Sig Start Date End Date Taking? Authorizing Provider  aspirin EC 81 MG tablet Take 81 mg by mouth once.   Yes Historical Provider, MD  omeprazole (PRILOSEC) 20 MG capsule Take 20 mg by mouth 2 (two) times daily as needed (acid reflux).   Yes Historical Provider, MD   BP 134/89  Pulse 72  Temp(Src) 97.8 F (36.6 C) (Oral)  Resp 22  Ht $R'5\' 2"'ni$  (1.575 m)  Wt 195 lb (88.451 kg)  BMI 35.66 kg/m2  SpO2 100%  LMP 08/09/2013 Physical Exam  Nursing note and vitals reviewed. Constitutional: She appears well-developed  and well-nourished.  Pt is sitting up in exam bed, appears mildly anxious, talking with friend in room  HENT:  Head: Normocephalic and atraumatic.  Eyes: Conjunctivae are normal. No scleral icterus.  Neck: Normal range of motion.  Cardiovascular: Normal rate, regular rhythm and normal heart sounds.   Regular rate and rhythm   Pulmonary/Chest: Effort normal and breath sounds normal. No respiratory distress. She has no wheezes. She has no rales. She exhibits no tenderness.  Mild SOB becoming winded between sentences but talking fast.   Abdominal: Soft. Bowel sounds are normal. She exhibits no distension and no mass. There is no tenderness. There is no rebound and no guarding.  Musculoskeletal: Normal range of motion.  Neurological: She is alert.  Skin: Skin is warm and dry.  Psychiatric: Her mood appears anxious ( mildly).    ED Course  Procedures (including critical care time) Labs Review Labs Reviewed  BASIC METABOLIC PANEL - Abnormal; Notable for the following:    Glucose, Bld 117 (*)    All other components within normal limits  CBC  TROPONIN I  D-DIMER, QUANTITATIVE    Imaging Review Dg Chest 2 View  09/25/2013   CLINICAL DATA:  Left chest, arm and leg pain  EXAM: CHEST  2 VIEW  COMPARISON:  Prior chest x-ray 07/10/2009  FINDINGS: Slightly low inspiratory volumes on the frontal view results mild crowding of vascular structures the lung bases. The lungs are clear and negative for focal airspace consolidation, pulmonary edema or suspicious pulmonary nodule. No pleural effusion or pneumothorax. Cardiac and mediastinal contours are within normal limits. No acute fracture or lytic or blastic osseous lesions. The visualized upper abdominal bowel gas pattern is unremarkable.  IMPRESSION: No active cardiopulmonary disease.   Electronically Signed   By: Jacqulynn Cadet M.D.   On: 09/25/2013 10:52     EKG Interpretation None      MDM   Final diagnoses:  Chest pain on breathing   Anxiety    Pt c/o intermittent chest pain and cramping in hands. Pt sent to ED from [facility] for further evaluation and tx of chest pain.  Pt does have hx of recent hx of car ride to and from [place].  Troponin and D-dimer negative.  Pt is asymptomatic in ED. CXR: unremarkable.   Pt still being tx by PCP for GERD.  Will discharge pt home to f/u with PCP as pt is stable. No evidence of emergent process taking place at this time. Encouraged pt to continue taking any medication prescribed by her PCP. Return precautions provided. Pt verbalized understanding and agreement with tx plan.  Noland Fordyce, PA-C 09/25/13 1514

## 2013-09-25 NOTE — ED Notes (Signed)
Per [facility], pt from [facility] for mid cp since 0700 intermittent. Denies any assoc sx other than cramping in her hands. Hx of anxiety but stopped taking her medication. Was taking generic lexapro. Given 324 mg ASA and 2 NTG. Denies any cp at this time. Reports pain in the back of her left shoulder. 1/10. 20g to LAC.

## 2013-09-25 NOTE — ED Notes (Signed)
Erin, PA at the bedside.  

## 2013-09-25 NOTE — ED Notes (Signed)
Junie Panning, PA at the bedside.

## 2013-09-25 NOTE — Progress Notes (Signed)
   Subjective:    Patient ID: Alejandra Harmon, female    DOB: Nov 25, 1960, 53 y.o.   MRN: [id number]  HPI 53 year old female presents to [facility] with chest pain Brought back emergently Beginning this morning--Felt flushed and tired, heart beating fast 91 bpm-115 bpm when checked by friend Left arm pain and chest pain began 30 minutes ago Took Aspirin 81 mg Complains of SOB, sore in left arm and when moves arm, feels pain on left side of chest, blurry vision, weakness, hoarse Patient felt hoarse and like throat was closing up x last night, on going-comes and goes Hands and feet swell occasionally   Patient rode in car from [place] to [place] on July 17th, spent weekend and then rode home on the 19th  history of anxiety Older brother had heart surgery  Family history of diabetes Non smoker  Review of Systems  Respiratory:       She has had recent problems with chest tightness and hoarseness currently under treatment for reflux symptoms. She was also recently treated for bronchitis. She did have a long trip to [place] this weekend in a car.       Objective:   Physical Exam  Constitutional: She appears well-developed and well-nourished.  HENT:  Head: Normocephalic.  Right Ear: External ear normal.  Left Ear: External ear normal.  Eyes: Pupils are equal, round, and reactive to light.  Neck: Neck supple.  Cardiovascular: Normal rate, regular rhythm, normal heart sounds and intact distal pulses.  Exam reveals no gallop and no friction rub.   No murmur heard. Pulmonary/Chest: Effort normal and breath sounds normal. No respiratory distress. She has no wheezes.  Abdominal: Soft.  Musculoskeletal:  There is tenderness over the precordium.. There is pain with elevation of the left arm.   calves are nontender. There is no swelling noted EKG no acute changes. There is low voltage over the limb leads       Assessment & Plan:  Patient will need further evaluation  with troponin and d-dimer. She did have a long car ride this weekend. Her calves are nontender to  touch EMS called. She was given a total of 4 baby aspirin one prior to arrival  and 3 in the office . She has been having allergic-type symptoms  with hoarseness and a feeling of swelling in her throat.. She did have a lot of salted nuts last night. At 9:33 I requestioned patient she's not currently having chest pain.

## 2013-09-25 NOTE — Discharge Instructions (Signed)
Chest Wall Pain °Chest wall pain is pain felt in or around the chest bones and muscles. It may take up to 6 weeks to get better. It may take longer if you are active. Chest wall pain can happen on its own. Other times, things like germs, injury, coughing, or exercise can cause the pain. °HOME CARE  °· Avoid activities that make you tired or cause pain. Try not to use your chest, belly (abdominal), or side muscles. Do not use heavy weights. °· Put ice on the sore area. °¨ Put ice in a plastic bag. °¨ Place a towel between your skin and the bag. °¨ Leave the ice on for 15-20 minutes for the first 2 days. °· Only take medicine as told by your doctor. °GET HELP RIGHT AWAY IF:  °· You have more pain or are very uncomfortable. °· You have a fever. °· Your chest pain gets worse. °· You have new problems. °· You feel sick to your stomach (nauseous) or throw up (vomit). °· You start to sweat or feel lightheaded. °· You have a cough with mucus (phlegm). °· You cough up blood. °MAKE SURE YOU:  °· Understand these instructions. °· Will watch your condition. °· Will get help right away if you are not doing well or get worse. °Document Released: 08/10/2007 Document Revised: 05/16/2011 Document Reviewed: 10/18/2010 °ExitCare® Patient Information ©2015 [employer]. This information is not intended to replace advice given to you by your health care provider. Make sure you discuss any questions you have with your health care provider. ° °

## 2013-09-26 ENCOUNTER — Encounter: Payer: Self-pay | Admitting: Emergency Medicine

## 2013-09-27 NOTE — ED Provider Notes (Signed)
Medical screening examination/treatment/procedure(s) were performed by non-physician practitioner and as supervising physician I was immediately available for consultation/collaboration.   EKG Interpretation None       Jasper Riling. Alvino Chapel, MD 09/27/13 0830

## 2013-10-02 ENCOUNTER — Ambulatory Visit (INDEPENDENT_AMBULATORY_CARE_PROVIDER_SITE_OTHER): Payer: No Typology Code available for payment source | Admitting: Internal Medicine

## 2013-10-02 ENCOUNTER — Encounter: Payer: Self-pay | Admitting: Internal Medicine

## 2013-10-02 VITALS — BP 124/82 | HR 62 | Temp 98.9°F | Wt 200.5 lb

## 2013-10-02 DIAGNOSIS — R7309 Other abnormal glucose: Secondary | ICD-10-CM

## 2013-10-02 DIAGNOSIS — F411 Generalized anxiety disorder: Secondary | ICD-10-CM

## 2013-10-02 DIAGNOSIS — R7302 Impaired glucose tolerance (oral): Secondary | ICD-10-CM

## 2013-10-02 DIAGNOSIS — R490 Dysphonia: Secondary | ICD-10-CM

## 2013-10-02 MED ORDER — ESCITALOPRAM OXALATE 10 MG PO TABS: 10.0000 mg | ORAL_TABLET | Freq: Every day | ORAL | Status: AC

## 2013-10-02 MED ORDER — ALPRAZOLAM 1 MG PO TABS: ORAL_TABLET | ORAL | Status: DC

## 2013-10-02 NOTE — Progress Notes (Signed)
Subjective:    Patient ID: Alejandra Harmon, female    DOB: 1960-12-19, 53 y.o.   MRN: [id number]  HPI  Hx per pt, Here after seen at UC with hoarseness, nausea, anxiety, tx with zpack, cough med, prednisone, cxr neg for acute.  Started her lexapro after seen at [facility], has some nausea that still persists. - dx with GAD.  Pt states knows of a family friend with hoarseness that was too late dx with cancer.  Had slight elev BS -  Pt denies polydipsia, polyuria.  Non smoker Past Medical History  Diagnosis Date  . ANXIETY 07/03/2007  . DEPRESSION 07/03/2007  . OBSTRUCTIVE SLEEP APNEA 08/19/2009  . ALLERGIC RHINITIS 07/03/2007  . GERD 08/31/2009  . BREAST HYPERTROPHY 10/30/2008  . KNEE PAIN, RIGHT 05/26/2009  . BACK PAIN 10/30/2008  . Right thyroid nodule    Past Surgical History  Procedure Laterality Date  . Cholecystectomy    . S/p c-section    . S/p breast cyst  1980  . Right elbow  2005    Right elbow ulnar nerve decompression  . Abdominal adhesion surgery  2005    Lysis of adhesion and right ovary removal  . Bilateral reduction mammoplasty  2010    reports that she has never smoked. She has never used smokeless tobacco. She reports that she does not drink alcohol or use illicit drugs. family history includes Bone cancer in her maternal aunt; Breast cancer (age of onset: 23) in her sister; Breast cancer (age of onset: 27) in her maternal aunt; Breast cancer (age of onset: 22) in her sister; Breast cancer (age of onset: 60) in her mother; Cancer in her other; Diabetes in her brother, brother, brother, brother, brother, brother, mother, other, sister, sister, and sister; Hypertension in her mother and other; Leukemia in her brother; Prostate cancer in her maternal uncle and paternal uncle; Prostate cancer (age of onset: 65) in her brother; Prostate cancer (age of onset: 54) in her brother; Renal cancer (age of onset: 27) in her mother. Allergies  Allergen Reactions  . Bactrim  [Sulfamethoxazole-Trimethoprim] Hives   Current Outpatient Prescriptions on File Prior to Visit  Medication Sig Dispense Refill  . aspirin EC 81 MG tablet Take 81 mg by mouth once.      Marland Kitchen omeprazole (PRILOSEC) 20 MG capsule Take 20 mg by mouth 2 (two) times daily as needed (acid reflux).       No current facility-administered medications on file prior to visit.     Review of Systems  Constitutional: Negative for unusual diaphoresis or other sweats  HENT: Negative for ringing in ear Eyes: Negative for double vision or worsening visual disturbance.  Respiratory: Negative for choking and stridor.   Gastrointestinal: Negative for vomiting or other signifcant bowel change Genitourinary: Negative for hematuria or decreased urine volume.  Musculoskeletal: Negative for other MSK pain or swelling Skin: Negative for color change and worsening wound.  Neurological: Negative for tremors and numbness other than noted  Psychiatric/Behavioral: Negative for decreased concentration or agitation other than above       Objective:   Physical Exam BP 124/82  Pulse 62  Temp(Src) 98.9 F (37.2 C) (Oral)  Wt 200 lb 8 oz (90.946 kg)  SpO2 98%  LMP 08/09/2013 VS noted, not ill appaering, mild hoarse/voice change noted Constitutional: Pt appears well-developed, well-nourished.  HENT: Head: NCAT.  Right Ear: External ear normal.  Left Ear: External ear normal.  Eyes: . Pupils are equal, round, and reactive to  light. Conjunctivae and EOM are normal Neck: Normal range of motion. Neck supple.  Cardiovascular: Normal rate and regular rhythm.   Pulmonary/Chest: Effort normal and breath sounds normal.  Neurological: Pt is alert. Not confused , motor grossly intact Skin: Skin is warm. No rash Psychiatric: Pt behavior is normal. No agitation. 2+ nervous, not depressed affect    Assessment & Plan:

## 2013-10-02 NOTE — Patient Instructions (Signed)
Please take all new medication as prescribed - the xanax as needed  Please continue all other medications as before, and refills have been done if requested.  Please have the pharmacy call with any other refills you may need.  Please keep your appointments with your specialists as you may have planned  Please go to the LAB in the Basement (turn left off the elevator) for the tests to be done at your convenience - just the A1c test  You will be contacted regarding the referral for: ENT  Please return in 6 months, or sooner if needed

## 2013-10-02 NOTE — Progress Notes (Signed)
Pre visit review using our clinic review tool, if applicable. No additional management support is needed unless otherwise documented below in the visit note. 

## 2013-10-03 DIAGNOSIS — R490 Dysphonia: Secondary | ICD-10-CM | POA: Insufficient documentation

## 2013-10-03 NOTE — Assessment & Plan Note (Signed)
?   Post infectious persistent, vs other - to finish tx as per UC, also for ENT referral per pt reqeust

## 2013-10-03 NOTE — Assessment & Plan Note (Signed)
To cont the lexapro she jsut started x few days, for xanax prn limited rx, not meant to be long term

## 2013-10-03 NOTE — Assessment & Plan Note (Signed)
stable overall by history and exam, recent data reviewed with pt, and pt to continue medical treatment as before,  to f/u any worsening symptoms or concerns, for f/u a1c 

## 2013-10-07 ENCOUNTER — Other Ambulatory Visit (INDEPENDENT_AMBULATORY_CARE_PROVIDER_SITE_OTHER): Payer: No Typology Code available for payment source

## 2013-10-07 DIAGNOSIS — R7309 Other abnormal glucose: Secondary | ICD-10-CM

## 2013-10-07 DIAGNOSIS — R7302 Impaired glucose tolerance (oral): Secondary | ICD-10-CM

## 2013-10-07 LAB — HEMOGLOBIN A1C: HEMOGLOBIN A1C: 6.5 % (ref 4.6–6.5)

## 2013-10-08 ENCOUNTER — Encounter: Payer: Self-pay | Admitting: Internal Medicine

## 2013-10-15 ENCOUNTER — Encounter: Payer: Self-pay | Admitting: Internal Medicine

## 2013-11-26 ENCOUNTER — Other Ambulatory Visit: Payer: Self-pay

## 2013-11-26 MED ORDER — ALPRAZOLAM 1 MG PO TABS
ORAL_TABLET | ORAL | Status: DC
Start: 2013-11-26 — End: 2014-11-03

## 2013-11-26 NOTE — Telephone Encounter (Signed)
Done hardcopy to robin  

## 2013-11-26 NOTE — Telephone Encounter (Signed)
Faxed hardcopy for Alprazolam to [facility] [address] 

## 2013-12-09 ENCOUNTER — Ambulatory Visit (AMBULATORY_SURGERY_CENTER): Payer: Self-pay

## 2013-12-09 ENCOUNTER — Telehealth: Payer: Self-pay | Admitting: Internal Medicine

## 2013-12-09 VITALS — Ht 62.0 in | Wt 197.0 lb

## 2013-12-09 DIAGNOSIS — Z1211 Encounter for screening for malignant neoplasm of colon: Secondary | ICD-10-CM

## 2013-12-09 MED ORDER — MOVIPREP 100 G PO SOLR: 1.0000 | Freq: Once | ORAL | Status: DC

## 2013-12-09 NOTE — Telephone Encounter (Signed)
Called pt and got her voice mail. Left message on this number that identifies pt by first and last name that we would call in free prep coupon information to her [facility] [address]. Called pharmacy and gave all coupon info to the pharmacist to provide a free movi prep to pt. 1525 12-09-2013    Lelan Pons pre visit

## 2013-12-09 NOTE — Progress Notes (Signed)
No allergies to eggs or soy No problems with anesthesia No home oxygen No diet/weight loss meds  Has email  [email or web address]

## 2013-12-19 ENCOUNTER — Encounter: Payer: Self-pay | Admitting: Internal Medicine

## 2013-12-20 ENCOUNTER — Ambulatory Visit (INDEPENDENT_AMBULATORY_CARE_PROVIDER_SITE_OTHER): Payer: No Typology Code available for payment source | Admitting: Internal Medicine

## 2013-12-20 ENCOUNTER — Other Ambulatory Visit (INDEPENDENT_AMBULATORY_CARE_PROVIDER_SITE_OTHER): Payer: No Typology Code available for payment source

## 2013-12-20 ENCOUNTER — Encounter: Payer: Self-pay | Admitting: Internal Medicine

## 2013-12-20 VITALS — BP 130/80 | HR 77 | Temp 98.3°F | Ht 62.0 in | Wt 200.0 lb

## 2013-12-20 DIAGNOSIS — Z0000 Encounter for general adult medical examination without abnormal findings: Secondary | ICD-10-CM

## 2013-12-20 DIAGNOSIS — R7302 Impaired glucose tolerance (oral): Secondary | ICD-10-CM

## 2013-12-20 DIAGNOSIS — F32A Depression, unspecified: Secondary | ICD-10-CM

## 2013-12-20 DIAGNOSIS — Z0189 Encounter for other specified special examinations: Secondary | ICD-10-CM

## 2013-12-20 DIAGNOSIS — F329 Major depressive disorder, single episode, unspecified: Secondary | ICD-10-CM

## 2013-12-20 LAB — HEPATIC FUNCTION PANEL
ALK PHOS: 71 U/L (ref 39–117)
ALT: 17 U/L (ref 0–35)
AST: 18 U/L (ref 0–37)
Albumin: 3.6 g/dL (ref 3.5–5.2)
BILIRUBIN DIRECT: 0.2 mg/dL (ref 0.0–0.3)
Total Bilirubin: 1 mg/dL (ref 0.2–1.2)
Total Protein: 6.8 g/dL (ref 6.0–8.3)

## 2013-12-20 LAB — LIPID PANEL
CHOL/HDL RATIO: 3
Cholesterol: 186 mg/dL (ref 0–200)
HDL: 58.6 mg/dL (ref >39.00)
LDL Cholesterol: 114 mg/dL — ABNORMAL HIGH (ref 0–99)
NONHDL: 127.4
Triglycerides: 68 mg/dL (ref 0.0–149.0)
VLDL: 13.6 mg/dL (ref 0.0–40.0)

## 2013-12-20 LAB — BASIC METABOLIC PANEL
BUN: 6 mg/dL (ref 6–23)
CHLORIDE: 106 meq/L (ref 96–112)
CO2: 27 mEq/L (ref 19–32)
Calcium: 9.3 mg/dL (ref 8.4–10.5)
Creatinine, Ser: 0.8 mg/dL (ref 0.4–1.2)
GFR: 103.21 mL/min (ref >60.00)
Glucose, Bld: 90 mg/dL (ref 70–99)
Potassium: 3.9 mEq/L (ref 3.5–5.1)
SODIUM: 138 meq/L (ref 135–145)

## 2013-12-20 LAB — HEMOGLOBIN A1C: HEMOGLOBIN A1C: 5.8 % (ref 4.6–6.5)

## 2013-12-20 NOTE — Progress Notes (Signed)
Subjective:    Patient ID: Alejandra Harmon, female    DOB: 02-14-1961, 53 y.o.   MRN: [id number]  HPI  Here to f/u; overall doing ok,  Pt denies chest pain, increased sob or doe, wheezing, orthopnea, PND, increased LE swelling, palpitations, dizziness or syncope. No further CP since neg ER eval July 2015.    Pt denies polydipsia, polyuria, or low sugar symptoms such as weakness or confusion improved with po intake.  Pt denies new neurological symptoms such as new headache, or facial or extremity weakness or numbness.   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   Denies worsening depressive symptoms, suicidal ideation, or panic Past Medical History  Diagnosis Date  . ANXIETY 07/03/2007  . DEPRESSION 07/03/2007  . OBSTRUCTIVE SLEEP APNEA 08/19/2009  . ALLERGIC RHINITIS 07/03/2007  . GERD 08/31/2009  . BREAST HYPERTROPHY 10/30/2008  . KNEE PAIN, RIGHT 05/26/2009  . BACK PAIN 10/30/2008  . Right thyroid nodule    Past Surgical History  Procedure Laterality Date  . Cholecystectomy    . S/p c-section    . S/p breast cyst  1980  . Right elbow  2005    Right elbow ulnar nerve decompression  . Abdominal adhesion surgery  2005    Lysis of adhesion and right ovary removal  . Bilateral reduction mammoplasty  2010    reports that she has never smoked. She has never used smokeless tobacco. She reports that she does not drink alcohol or use illicit drugs. family history includes Bone cancer in her maternal aunt; Breast cancer (age of onset: 19) in her sister; Breast cancer (age of onset: 31) in her maternal aunt; Breast cancer (age of onset: 7) in her sister; Breast cancer (age of onset: 36) in her mother; Cancer in her other; Diabetes in her brother, brother, brother, brother, brother, brother, mother,  other, sister, sister, and sister; Hypertension in her mother and other; Leukemia in her brother; Prostate cancer in her maternal uncle and paternal uncle; Prostate cancer (age of onset: 66) in her brother; Prostate cancer (age of onset: 20) in her brother; Renal cancer (age of onset: 62) in her mother. There is no history of Colon cancer. Allergies  Allergen Reactions  . Bactrim [Sulfamethoxazole-Trimethoprim] Hives   Current Outpatient Prescriptions on File Prior to Visit  Medication Sig Dispense Refill  . ALPRAZolam (XANAX) 1 MG tablet 1/2 - 1 tab by mouth once per day as needed  30 tablet  1  . aspirin EC 81 MG tablet Take 81 mg by mouth once.      . escitalopram (LEXAPRO) 10 MG tablet Take 1 tablet (10 mg total) by mouth daily.  90 tablet  3  . MOVIPREP 100 G SOLR Take 1 kit (200 g total) by mouth once.  1 kit  0  . omeprazole (PRILOSEC) 20 MG capsule Take 20 mg by mouth 2 (two) times daily as needed (acid reflux).       No current facility-administered medications on file prior to visit.   Review of Systems  Constitutional: Negative for unusual diaphoresis or other sweats  HENT: Negative for ringing in ear Eyes: Negative for double vision or worsening visual disturbance.  Respiratory: Negative for choking and stridor.   Gastrointestinal: Negative for vomiting or other signifcant  bowel change Genitourinary: Negative for hematuria or decreased urine volume.  Musculoskeletal: Negative for other MSK pain or swelling Skin: Negative for color change and worsening wound.  Neurological: Negative for tremors and numbness other than noted  Psychiatric/Behavioral: Negative for decreased concentration or agitation other than above       Objective:   Physical Exam BP 130/80  Pulse 77  Temp(Src) 98.3 F (36.8 C)  Ht $R'5\' 2"'Py$  (1.575 m)  Wt 200 lb (90.719 kg)  BMI 36.57 kg/m2  SpO2 96%  LMP 10/05/2013 VS noted,  Constitutional: Pt appears well-developed, well-nourished.  HENT: Head: NCAT.   Right Ear: External ear normal.  Left Ear: External ear normal.  Eyes: . Pupils are equal, round, and reactive to light. Conjunctivae and EOM are normal Neck: Normal range of motion. Neck supple.  Cardiovascular: Normal rate and regular rhythm.   Pulmonary/Chest: Effort normal and breath sounds normal.  Abd:  Soft, NT, ND, + BS Neurological: Pt is alert. Not confused , motor grossly intact Skin: Skin is warm. No rash Psychiatric: Pt behavior is normal. No agitation.     Assessment & Plan:

## 2013-12-20 NOTE — Assessment & Plan Note (Signed)
stable overall by history and exam, recent data reviewed with pt, and pt to continue medical treatment as before,  to f/u any worsening symptoms or concerns, for f/u lab 

## 2013-12-20 NOTE — Patient Instructions (Signed)
Please continue all other medications as before, and refills have been done if requested.  Please have the pharmacy call with any other refills you may need.  Please go to the LAB in the Basement (turn left off the elevator) for the tests to be done today  You will be contacted by phone if any changes need to be made immediately.  Otherwise, you will receive a letter about your results with an explanation, but please check with MyChart first.  Please remember to sign up for MyChart if you have not done so, as this will be important to you in the future with finding out test results, communicating by private email, and scheduling acute appointments online when needed.  Please return in 6 months, or sooner if needed, with Lab testing done 3-5 days before

## 2013-12-20 NOTE — Progress Notes (Signed)
Pre visit review using our clinic review tool, if applicable. No additional management support is needed unless otherwise documented below in the visit note. 

## 2013-12-20 NOTE — Assessment & Plan Note (Signed)
stable overall by history and exam, though now jobless, mult stressors mostly financial, delcines cousneling due to cost, or other tx such as ssri for now,  to f/u any worsening symptoms or concerns,

## 2013-12-23 ENCOUNTER — Ambulatory Visit (AMBULATORY_SURGERY_CENTER): Payer: No Typology Code available for payment source | Admitting: Internal Medicine

## 2013-12-23 ENCOUNTER — Encounter: Payer: Self-pay | Admitting: Internal Medicine

## 2013-12-23 VITALS — BP 139/74 | HR 59 | Temp 96.8°F | Resp 15 | Ht 62.0 in | Wt 197.0 lb

## 2013-12-23 DIAGNOSIS — Z1211 Encounter for screening for malignant neoplasm of colon: Secondary | ICD-10-CM

## 2013-12-23 HISTORY — PX: COLONOSCOPY WITH PROPOFOL: SHX5780

## 2013-12-23 MED ORDER — SODIUM CHLORIDE 0.9 % IV SOLN: 500.0000 mL | INTRAVENOUS | Status: DC

## 2013-12-23 NOTE — Patient Instructions (Signed)
Impressions/recommendations:  Normal colonoscopy  Repeat colonoscopy in 10 years.  YOU HAD AN ENDOSCOPIC PROCEDURE TODAY AT [facility]: Refer to the procedure report that was given to you for any specific questions about what was found during the examination.  If the procedure report does not answer your questions, please call your gastroenterologist to clarify.  If you requested that your care partner not be given the details of your procedure findings, then the procedure report has been included in a sealed envelope for you to review at your convenience later.  YOU SHOULD EXPECT: Some feelings of bloating in the abdomen. Passage of more gas than usual.  Walking can help get rid of the air that was put into your GI tract during the procedure and reduce the bloating. If you had a lower endoscopy (such as a colonoscopy or flexible sigmoidoscopy) you may notice spotting of blood in your stool or on the toilet paper. If you underwent a bowel prep for your procedure, then you may not have a normal bowel movement for a few days.  DIET: Your first meal following the procedure should be a light meal and then it is ok to progress to your normal diet.  A half-sandwich or bowl of soup is an example of a good first meal.  Heavy or fried foods are harder to digest and may make you feel nauseous or bloated.  Likewise meals heavy in dairy and vegetables can cause extra gas to form and this can also increase the bloating.  Drink plenty of fluids but you should avoid alcoholic beverages for 24 hours.  ACTIVITY: Your care partner should take you home directly after the procedure.  You should plan to take it easy, moving slowly for the rest of the day.  You can resume normal activity the day after the procedure however you should NOT DRIVE or use heavy machinery for 24 hours (because of the sedation medicines used during the test).    SYMPTOMS TO REPORT IMMEDIATELY: A gastroenterologist can be reached  at any hour.  During normal business hours, 8:30 AM to 5:00 PM Monday through Friday, call [phone].  After hours and on weekends, please call the GI answering service at [phone] who will take a message and have the physician on call contact you.   Following lower endoscopy (colonoscopy or flexible sigmoidoscopy):  Excessive amounts of blood in the stool  Significant tenderness or worsening of abdominal pains  Swelling of the abdomen that is new, acute  Fever of 100F or higher   FOLLOW UP: If any biopsies were taken you will be contacted by phone or by letter within the next 1-3 weeks.  Call your gastroenterologist if you have not heard about the biopsies in 3 weeks.  Our staff will call the home number listed on your records the next business day following your procedure to check on you and address any questions or concerns that you may have at that time regarding the information given to you following your procedure. This is a courtesy call and so if there is no answer at the home number and we have not heard from you through the emergency physician on call, we will assume that you have returned to your regular daily activities without incident.  SIGNATURES/CONFIDENTIALITY: You and/or your care partner have signed paperwork which will be entered into your electronic medical record.  These signatures attest to the fact that that the information above on your After Visit Summary has been  reviewed and is understood.  Full responsibility of the confidentiality of this discharge information lies with you and/or your care-partner. 

## 2013-12-23 NOTE — Progress Notes (Signed)
Report to PACU, RN, vss, BBS= Clear.  

## 2013-12-23 NOTE — Op Note (Signed)
[facility]  [address] [place], [address]   COLONOSCOPY PROCEDURE REPORT  PATIENT: Alejandra Harmon, Alejandra Harmon  MR#: [id number] BIRTHDATE: [birth year] , 52  yrs. old GENDER: female ENDOSCOPIST: Jerene Bears, MD REFERRED ZG:[id number] John, M.D. PROCEDURE DATE:  12/23/2013 PROCEDURE:   Colonoscopy, screening First Screening Colonoscopy - Avg.  risk and is 50 yrs.  old or older - No.  Prior Negative Screening - Now for repeat screening. N/A  History of Adenoma - Now for follow-up colonoscopy & has been > or = to 3 yrs.  N/A  Polyps Removed Today? No.  Polyps Removed Today? No.  Recommend repeat exam, <10 yrs? Polyps Removed Today? No.  Recommend repeat exam, <10 yrs? No. ASA CLASS:   Class II INDICATIONS:average risk for colorectal cancer and first colonoscopy. MEDICATIONS: Monitored anesthesia care and Propofol 240 mg IV  DESCRIPTION OF PROCEDURE:   After the risks benefits and alternatives of the procedure were thoroughly explained, informed consent was obtained.  The digital rectal exam revealed no abnormalities of the rectum.   The LB [id number]  endoscope was introduced through the anus and advanced to the cecum, which was identified by both the appendix and ileocecal valve. No adverse events experienced.   The quality of the prep was good, using MoviPrep  The instrument was then slowly withdrawn as the colon was fully examined.    COLON FINDINGS: A normal appearing cecum, ileocecal valve, and appendiceal orifice were identified.  The ascending, transverse, descending, sigmoid colon, and rectum appeared unremarkable. Retroflexed views revealed external hemorrhoids. The time to cecum=2 minutes 46 seconds.  Withdrawal time=11 minutes 27 seconds. The scope was withdrawn and the procedure completed.  COMPLICATIONS: There were no immediate complications.  ENDOSCOPIC IMPRESSION: Normal colonoscopy  RECOMMENDATIONS: You should continue to follow colorectal cancer  screening guidelines for "routine risk" patients with a repeat colonoscopy in 10 years. There is no need for FOBT (stool) testing for at least 5 years.  eSigned:  Jerene Bears, MD 12/23/2013 12:02 PM   cc: The Patient and Biagio Borg, MD

## 2013-12-24 ENCOUNTER — Telehealth: Payer: Self-pay

## 2013-12-24 ENCOUNTER — Encounter: Payer: Self-pay | Admitting: Internal Medicine

## 2013-12-24 NOTE — Telephone Encounter (Signed)
Unable to leave message mailbox full.

## 2014-01-06 ENCOUNTER — Encounter: Payer: Self-pay | Admitting: Internal Medicine

## 2014-01-16 ENCOUNTER — Encounter: Payer: No Typology Code available for payment source | Admitting: Internal Medicine

## 2014-04-30 ENCOUNTER — Other Ambulatory Visit: Payer: Self-pay

## 2014-04-30 DIAGNOSIS — Z1231 Encounter for screening mammogram for malignant neoplasm of breast: Secondary | ICD-10-CM

## 2014-05-12 ENCOUNTER — Ambulatory Visit: Payer: 59

## 2014-05-13 ENCOUNTER — Ambulatory Visit: Payer: 59

## 2014-05-13 ENCOUNTER — Ambulatory Visit
Admission: RE | Admit: 2014-05-13 | Discharge: 2014-05-13 | Disposition: A | Discharge status: Home / Self Care | Payer: 59 | Source: Ambulatory Visit

## 2014-05-13 DIAGNOSIS — Z1231 Encounter for screening mammogram for malignant neoplasm of breast: Secondary | ICD-10-CM

## 2014-05-13 LAB — HM MAMMOGRAPHY

## 2014-06-19 ENCOUNTER — Encounter: Payer: Self-pay | Admitting: Internal Medicine

## 2014-06-19 ENCOUNTER — Other Ambulatory Visit (INDEPENDENT_AMBULATORY_CARE_PROVIDER_SITE_OTHER): Payer: 59

## 2014-06-19 ENCOUNTER — Ambulatory Visit (INDEPENDENT_AMBULATORY_CARE_PROVIDER_SITE_OTHER): Payer: 59 | Admitting: Internal Medicine

## 2014-06-19 VITALS — BP 128/72 | HR 71 | Temp 98.7°F | Resp 18 | Ht 62.0 in | Wt 204.0 lb

## 2014-06-19 DIAGNOSIS — Z0000 Encounter for general adult medical examination without abnormal findings: Secondary | ICD-10-CM

## 2014-06-19 DIAGNOSIS — R7302 Impaired glucose tolerance (oral): Secondary | ICD-10-CM | POA: Diagnosis not present

## 2014-06-19 DIAGNOSIS — E669 Obesity, unspecified: Secondary | ICD-10-CM | POA: Diagnosis not present

## 2014-06-19 LAB — CBC WITH DIFFERENTIAL/PLATELET
BASOS ABS: 0 10*3/uL (ref 0.0–0.1)
Basophils Relative: 0.4 % (ref 0.0–3.0)
Eosinophils Absolute: 0.2 10*3/uL (ref 0.0–0.7)
Eosinophils Relative: 2.1 % (ref 0.0–5.0)
HCT: 41.4 % (ref 36.0–46.0)
HEMOGLOBIN: 13.8 g/dL (ref 12.0–15.0)
LYMPHS PCT: 32.6 % (ref 12.0–46.0)
Lymphs Abs: 2.6 10*3/uL (ref 0.7–4.0)
MCHC: 33.4 g/dL (ref 30.0–36.0)
MCV: 80 fl (ref 78.0–100.0)
MONOS PCT: 7.1 % (ref 3.0–12.0)
Monocytes Absolute: 0.6 10*3/uL (ref 0.1–1.0)
Neutro Abs: 4.6 10*3/uL (ref 1.4–7.7)
Neutrophils Relative %: 57.8 % (ref 43.0–77.0)
PLATELETS: 278 10*3/uL (ref 150.0–400.0)
RBC: 5.17 Mil/uL — ABNORMAL HIGH (ref 3.87–5.11)
RDW: 13.9 % (ref 11.5–15.5)
WBC: 7.9 10*3/uL (ref 4.0–10.5)

## 2014-06-19 LAB — LIPID PANEL
CHOL/HDL RATIO: 3
Cholesterol: 202 mg/dL — ABNORMAL HIGH (ref 0–200)
HDL: 78.6 mg/dL (ref >39.00)
LDL Cholesterol: 104 mg/dL — ABNORMAL HIGH (ref 0–99)
NONHDL: 123.4
Triglycerides: 97 mg/dL (ref 0.0–149.0)
VLDL: 19.4 mg/dL (ref 0.0–40.0)

## 2014-06-19 LAB — HEMOGLOBIN A1C: HEMOGLOBIN A1C: 6.4 % (ref 4.6–6.5)

## 2014-06-19 LAB — HEPATIC FUNCTION PANEL
ALT: 14 U/L (ref 0–35)
AST: 12 U/L (ref 0–37)
Albumin: 4.4 g/dL (ref 3.5–5.2)
Alkaline Phosphatase: 89 U/L (ref 39–117)
BILIRUBIN DIRECT: 0.1 mg/dL (ref 0.0–0.3)
Total Bilirubin: 0.6 mg/dL (ref 0.2–1.2)
Total Protein: 7.3 g/dL (ref 6.0–8.3)

## 2014-06-19 LAB — URINALYSIS, ROUTINE W REFLEX MICROSCOPIC
Bilirubin Urine: NEGATIVE
Hgb urine dipstick: NEGATIVE
Ketones, ur: NEGATIVE
Leukocytes, UA: NEGATIVE
NITRITE: POSITIVE — AB
Specific Gravity, Urine: 1.015 (ref 1.000–1.030)
TOTAL PROTEIN, URINE-UPE24: NEGATIVE
URINE GLUCOSE: NEGATIVE
Urobilinogen, UA: 0.2 (ref 0.0–1.0)
pH: 7 (ref 5.0–8.0)

## 2014-06-19 LAB — BASIC METABOLIC PANEL
BUN: 17 mg/dL (ref 6–23)
CO2: 28 mEq/L (ref 19–32)
CREATININE: 0.76 mg/dL (ref 0.40–1.20)
Calcium: 9.6 mg/dL (ref 8.4–10.5)
Chloride: 104 mEq/L (ref 96–112)
GFR: 103.01 mL/min (ref >60.00)
GLUCOSE: 96 mg/dL (ref 70–99)
POTASSIUM: 3.9 meq/L (ref 3.5–5.1)
Sodium: 138 mEq/L (ref 135–145)

## 2014-06-19 LAB — TSH: TSH: 2.67 u[IU]/mL (ref 0.35–4.50)

## 2014-06-19 MED ORDER — PHENTERMINE HCL 37.5 MG PO CAPS: 37.5000 mg | ORAL_CAPSULE | ORAL | Status: DC

## 2014-06-19 NOTE — Patient Instructions (Signed)
Please take all new medication as prescribed - the phentermine  Please continue all other medications as before, and refills have been done if requested.  Please have the pharmacy call with any other refills you may need.  Please continue your efforts at being more active, low cholesterol diet, and weight control.  You are otherwise up to date with prevention measures today.  Please keep your appointments with your specialists as you may have planned  Please go to the LAB in the Basement (turn left off the elevator) for the tests to be done today  You will be contacted by phone if any changes need to be made immediately.  Otherwise, you will receive a letter about your results with an explanation, but please check with MyChart first.  Please remember to sign up for MyChart if you have not done so, as this will be important to you in the future with finding out test results, communicating by private email, and scheduling acute appointments online when needed.  Please return in 1 year for your yearly visit, or sooner if needed, with Lab testing done 3-5 days before  

## 2014-06-19 NOTE — Progress Notes (Signed)
Pre visit review using our clinic review tool, if applicable. No additional management support is needed unless otherwise documented below in the visit note. 

## 2014-06-19 NOTE — Assessment & Plan Note (Signed)
stable overall by history and exam, recent data reviewed with pt, and pt to continue medical treatment as before,  to f/u any worsening symptoms or concerns Lab Results  Component Value Date   HGBA1C 5.8 12/20/2013

## 2014-06-19 NOTE — Assessment & Plan Note (Signed)

## 2014-06-19 NOTE — Assessment & Plan Note (Signed)
Ok for phentermine limited rx

## 2014-06-19 NOTE — Progress Notes (Signed)
Subjective:    Patient ID: Alejandra Harmon, female    DOB: 1961/02/18, 54 y.o.   MRN: [id number]  HPI  Here for wellness and f/u;  Overall doing ok;  Pt denies Chest pain, worsening SOB, DOE, wheezing, orthopnea, PND, worsening LE edema, palpitations, dizziness or syncope.  Pt denies neurological change such as new headache, facial or extremity weakness.  Pt denies polydipsia, polyuria, or low sugar symptoms. Pt states overall good compliance with treatment and medications, good tolerability, and has been trying to follow appropriate diet.  Pt denies worsening depressive symptoms, suicidal ideation or panic. No fever, night sweats, wt loss, loss of appetite, or other constitutional symptoms.  Pt states good ability with ADL's, has low fall risk, home safety reviewed and adequate, no other significant changes in hearing or vision, and only occasionally active with exercise. 2 sisters and mother with breast cancer. S/p genetic testing, has decided on con'td routine screening.  Hard to lose wt, asks for phentermine Past Medical History  Diagnosis Date  . ANXIETY 07/03/2007  . DEPRESSION 07/03/2007  . OBSTRUCTIVE SLEEP APNEA 08/19/2009  . ALLERGIC RHINITIS 07/03/2007  . GERD 08/31/2009  . BREAST HYPERTROPHY 10/30/2008  . KNEE PAIN, RIGHT 05/26/2009  . BACK PAIN 10/30/2008  . Right thyroid nodule    Past Surgical History  Procedure Laterality Date  . Cholecystectomy    . S/p c-section    . S/p breast cyst  1980  . Right elbow  2005    Right elbow ulnar nerve decompression  . Abdominal adhesion surgery  2005    Lysis of adhesion and right ovary removal  . Bilateral reduction mammoplasty  2010    reports that she has never smoked. She has never used smokeless tobacco. She reports that she does not drink alcohol or use illicit drugs. family history includes Bone cancer in her maternal aunt; Breast cancer (age of onset: 41) in her sister; Breast cancer (age of onset: 49) in her maternal aunt; Breast  cancer (age of onset: 7) in her sister; Breast cancer (age of onset: 78) in her mother; Cancer in her other; Diabetes in her brother, brother, brother, brother, brother, brother, mother, other, sister, sister, and sister; Hypertension in her mother and other; Leukemia in her brother; Prostate cancer in her maternal uncle and paternal uncle; Prostate cancer (age of onset: 5) in her brother; Prostate cancer (age of onset: 41) in her brother; Renal cancer (age of onset: 28) in her mother. There is no history of Colon cancer. Allergies  Allergen Reactions  . Bactrim [Sulfamethoxazole-Trimethoprim] Hives   Current Outpatient Prescriptions on File Prior to Visit  Medication Sig Dispense Refill  . aspirin EC 81 MG tablet Take 81 mg by mouth once.    Marland Kitchen omeprazole (PRILOSEC) 20 MG capsule Take 20 mg by mouth 2 (two) times daily as needed (acid reflux).    Marland Kitchen ALPRAZolam (XANAX) 1 MG tablet 1/2 - 1 tab by mouth once per day as needed (Patient not taking: Reported on 06/19/2014) 30 tablet 1  . diazepam (VALIUM) 5 MG tablet      No current facility-administered medications on file prior to visit.   Review of Systems Constitutional: Negative for increased diaphoresis, other activity, appetite or siginficant weight change other than noted HENT: Negative for worsening hearing loss, ear pain, facial swelling, mouth sores and neck stiffness.   Eyes: Negative for other worsening pain, redness or visual disturbance.  Respiratory: Negative for shortness of breath and wheezing  Cardiovascular: Negative  for chest pain and palpitations.  Gastrointestinal: Negative for diarrhea, blood in stool, abdominal distention or other pain Genitourinary: Negative for hematuria, flank pain or change in urine volume.  Musculoskeletal: Negative for myalgias or other joint complaints.  Skin: Negative for color change and wound or drainage.  Neurological: Negative for syncope and numbness. other than noted Hematological: Negative  for adenopathy. or other swelling Psychiatric/Behavioral: Negative for hallucinations, SI, self-injury, decreased concentration or other worsening agitation.      Objective:   Physical Exam BP 128/72 mmHg  Pulse 71  Temp(Src) 98.7 F (37.1 C) (Oral)  Resp 18  Ht 5\' 2"  (1.575 m)  Wt 204 lb (92.534 kg)  BMI 37.30 kg/m2  SpO2 98%  .VS noted,  Wt Readings from Last 3 Encounters:  06/19/14 204 lb (92.534 kg)  12/23/13 197 lb (89.359 kg)  12/20/13 200 lb (90.719 kg)   Constitutional: Pt is oriented to person, place, and time. Appears well-developed and well-nourished, in no significant distress Head: Normocephalic and atraumatic.  Right Ear: External ear normal.  Left Ear: External ear normal.  Nose: Nose normal.  Mouth/Throat: Oropharynx is clear and moist.  Eyes: Conjunctivae and EOM are normal. Pupils are equal, round, and reactive to light.  Neck: Normal range of motion. Neck supple. No JVD present. No tracheal deviation present or significant neck LA or mass Cardiovascular: Normal rate, regular rhythm, normal heart sounds and intact distal pulses.   Pulmonary/Chest: Effort normal and breath sounds without rales or wheezing  Abdominal: Soft. Bowel sounds are normal. NT. No HSM  Musculoskeletal: Normal range of motion. Exhibits no edema.  Lymphadenopathy:  Has no cervical adenopathy.  Neurological: Pt is alert and oriented to person, place, and time. Pt has normal reflexes. No cranial nerve deficit. Motor grossly intact Skin: Skin is warm and dry. No rash noted.  Psychiatric:  Has normal mood and affect. Behavior is normal.     Assessment & Plan:

## 2014-06-20 ENCOUNTER — Encounter: Payer: Self-pay | Admitting: Internal Medicine

## 2014-09-23 ENCOUNTER — Encounter: Payer: Self-pay | Admitting: Genetic Counselor

## 2014-09-23 ENCOUNTER — Telehealth: Payer: Self-pay | Admitting: Genetic Counselor

## 2014-09-23 DIAGNOSIS — Z1379 Encounter for other screening for genetic and chromosomal anomalies: Secondary | ICD-10-CM | POA: Insufficient documentation

## 2014-09-23 NOTE — Telephone Encounter (Signed)
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  She is doing well.  Discussed with her the updated NCCN guidelines.  She stated that Dr. Jenny Reichmann has told her this as well.  She does not feel that this is an option for her right now.

## 2014-11-03 ENCOUNTER — Encounter: Payer: Self-pay | Admitting: Family

## 2014-11-03 ENCOUNTER — Ambulatory Visit (INDEPENDENT_AMBULATORY_CARE_PROVIDER_SITE_OTHER): Payer: 59 | Admitting: Family

## 2014-11-03 VITALS — BP 118/84 | HR 74 | Temp 98.7°F | Resp 18 | Ht 62.0 in | Wt 200.0 lb

## 2014-11-03 DIAGNOSIS — R05 Cough: Secondary | ICD-10-CM

## 2014-11-03 DIAGNOSIS — R059 Cough, unspecified: Secondary | ICD-10-CM

## 2014-11-03 MED ORDER — AZITHROMYCIN 250 MG PO TABS: ORAL_TABLET | ORAL | Status: DC

## 2014-11-03 MED ORDER — PREDNISONE 20 MG PO TABS: 20.0000 mg | ORAL_TABLET | Freq: Every day | ORAL | Status: DC

## 2014-11-03 NOTE — Progress Notes (Signed)
Pre visit review using our clinic review tool, if applicable. No additional management support is needed unless otherwise documented below in the visit note. 

## 2014-11-03 NOTE — Patient Instructions (Signed)
Thank you for choosing [facility].  Summary/Instructions:  Your prescription(s) have been submitted to your pharmacy or been printed and provided for you. Please take as directed and contact our office if you believe you are having problem(s) with the medication(s) or have any questions.  If your symptoms worsen or fail to improve, please contact our office for further instruction, or in case of emergency go directly to the emergency room at the closest medical facility.   Please continue with the claritin. Consider of addition of flonase or nasocort.   General Recommendations:    Please drink plenty of fluids.  Get plenty of rest   Sleep in humidified air  Use saline nasal sprays  Netti pot   OTC Medications:  Decongestants - helps relieve congestion   Flonase (generic fluticasone) or Nasacort (generic triamcinolone) - please make sure to use the "cross-over" technique at a 45 degree angle towards the opposite eye as opposed to straight up the nasal passageway.   Sudafed (generic pseudoephedrine - Note this is the one that is available behind the pharmacy counter); Products with phenylephrine (-PE) may also be used but is often not as effective as pseudoephedrine.   If you have HIGH BLOOD PRESSURE - Coricidin HBP; AVOID any product that is -D as this contains pseudoephedrine which may increase your blood pressure.  Afrin (oxymetazoline) every 6-8 hours for up to 3 days.   Allergies - helps relieve runny nose, itchy eyes and sneezing   Claritin (generic loratidine), Allegra (fexofenidine), or Zyrtec (generic cyrterizine) for runny nose. These medications should not cause drowsiness.  Note - Benadryl (generic diphenhydramine) may be used however may cause drowsiness  Cough -   Delsym or Robitussin (generic dextromethorphan)  Expectorants - helps loosen mucus to ease removal   Mucinex (generic guaifenesin) as directed on the package.  Headaches / General  Aches   Tylenol (generic acetaminophen) - DO NOT EXCEED 3 grams (3,000 mg) in a 24 hour time period  Advil/Motrin (generic ibuprofen)   Sore Throat -   Salt water gargle   Chloraseptic (generic benzocaine) spray or lozenges / Sucrets (generic dyclonine)

## 2014-11-03 NOTE — Progress Notes (Addendum)
   Subjective:    Patient ID: Alejandra Harmon, female    DOB: [birth year], 54 y.o.   MRN: [id number]  Chief Complaint  Patient presents with  . Cough    when coughing feels pain that ranges from her chest to her back, states she does not think its from cough    HPI:  Alejandra Harmon is a 54 y.o. female with a PMH of TIA, obesity, OSA, depression, back pain and anxiety who presents today for an acute office visit.     This is a new problem. Associated symptom of pain located in her chest that radiates to her back has been going on for about 1 week. Pain is described as a dull pain that is aggrevated by coughing. Associated symptom of coughing has been going on for about 2 weeks. Modifying treatments include loratidine which does seem to help. Does have sneezing and watery eyes.   Allergies  Allergen Reactions  . Bactrim [Sulfamethoxazole-Trimethoprim] Hives    No current outpatient prescriptions on file prior to visit.   No current facility-administered medications on file prior to visit.         Review of Systems  Constitutional: Negative for fever and chills.  HENT: Positive for sneezing.   Eyes: Positive for itching.  Respiratory: Positive for cough. Negative for chest tightness and shortness of breath.   Cardiovascular: Negative for chest pain, palpitations and leg swelling.      Objective:    BP 118/84 mmHg  Pulse 74  Temp(Src) 98.7 F (37.1 C) (Oral)  Resp 18  Ht 5\' 2"  (1.575 m)  Wt 200 lb (90.719 kg)  BMI 36.57 kg/m2  SpO2 97% Nursing note and vital signs reviewed.  Physical Exam  Constitutional: She is oriented to person, place, and time. She appears well-developed and well-nourished. No distress.  HENT:  Right Ear: Hearing, tympanic membrane, external ear and ear canal normal.  Left Ear: Hearing, tympanic membrane, external ear and ear canal normal.  Nose: Nose normal. Right sinus exhibits no maxillary sinus tenderness and no frontal sinus tenderness. Left  sinus exhibits no maxillary sinus tenderness and no frontal sinus tenderness.  Mouth/Throat: Uvula is midline, oropharynx is clear and moist and mucous membranes are normal.  Eyes: Conjunctivae are normal. Pupils are equal, round, and reactive to light.  Cardiovascular: Normal rate, regular rhythm, normal heart sounds and intact distal pulses.   Pulmonary/Chest: Effort normal and breath sounds normal. She has no wheezes.  Neurological: She is alert and oriented to person, place, and time.  Skin: Skin is warm and dry.  Psychiatric: She has a normal mood and affect. Her behavior is normal. Judgment and thought content normal.       Assessment & Plan:   Problem List Items Addressed This Visit      Other   Cough - Primary    Cough most likely related to allergies but cannot rule out underlying infection secondary to length of time. Pain is most likely related to costochondritis. Start azithromycin. Start prednisone. Continue over the counter medications for symptom relief and supportive care. Follow up if symptoms worsen or fail to improve.       Relevant Medications   azithromycin (ZITHROMAX) 250 MG tablet   predniSONE (DELTASONE) 20 MG tablet

## 2014-11-03 NOTE — Assessment & Plan Note (Signed)
Cough most likely related to allergies but cannot rule out underlying infection secondary to length of time. Pain is most likely related to costochondritis. Start azithromycin. Start prednisone. Continue over the counter medications for symptom relief and supportive care. Follow up if symptoms worsen or fail to improve.

## 2014-11-13 ENCOUNTER — Encounter: Payer: Self-pay | Admitting: Genetic Counselor

## 2015-02-18 ENCOUNTER — Other Ambulatory Visit: Payer: Self-pay | Admitting: Obstetrics and Gynecology

## 2015-02-18 DIAGNOSIS — N63 Unspecified lump in unspecified breast: Secondary | ICD-10-CM

## 2015-02-24 ENCOUNTER — Ambulatory Visit
Admission: RE | Admit: 2015-02-24 | Discharge: 2015-02-24 | Disposition: A | Discharge status: Home / Self Care | Payer: 59 | Source: Ambulatory Visit | Attending: Obstetrics and Gynecology | Admitting: Obstetrics and Gynecology

## 2015-02-24 ENCOUNTER — Encounter (HOSPITAL_BASED_OUTPATIENT_CLINIC_OR_DEPARTMENT_OTHER): Payer: Self-pay | Admitting: *Deleted

## 2015-02-24 DIAGNOSIS — N63 Unspecified lump in unspecified breast: Secondary | ICD-10-CM

## 2015-02-24 DIAGNOSIS — R102 Pelvic and perineal pain: Secondary | ICD-10-CM | POA: Diagnosis present

## 2015-02-24 DIAGNOSIS — N800 Endometriosis of uterus: Secondary | ICD-10-CM | POA: Diagnosis not present

## 2015-02-24 DIAGNOSIS — G709 Myoneural disorder, unspecified: Secondary | ICD-10-CM | POA: Diagnosis not present

## 2015-02-24 DIAGNOSIS — N888 Other specified noninflammatory disorders of cervix uteri: Secondary | ICD-10-CM | POA: Diagnosis not present

## 2015-02-24 DIAGNOSIS — Z803 Family history of malignant neoplasm of breast: Secondary | ICD-10-CM | POA: Diagnosis not present

## 2015-02-24 DIAGNOSIS — G4730 Sleep apnea, unspecified: Secondary | ICD-10-CM | POA: Diagnosis not present

## 2015-02-24 DIAGNOSIS — K219 Gastro-esophageal reflux disease without esophagitis: Secondary | ICD-10-CM | POA: Diagnosis not present

## 2015-02-24 DIAGNOSIS — N8302 Follicular cyst of left ovary: Secondary | ICD-10-CM | POA: Diagnosis not present

## 2015-02-24 DIAGNOSIS — Z6833 Body mass index (BMI) 33.0-33.9, adult: Secondary | ICD-10-CM | POA: Diagnosis not present

## 2015-02-24 DIAGNOSIS — E041 Nontoxic single thyroid nodule: Secondary | ICD-10-CM | POA: Diagnosis not present

## 2015-02-24 DIAGNOSIS — N736 Female pelvic peritoneal adhesions (postinfective): Secondary | ICD-10-CM | POA: Diagnosis not present

## 2015-02-24 DIAGNOSIS — E669 Obesity, unspecified: Secondary | ICD-10-CM | POA: Diagnosis not present

## 2015-02-24 DIAGNOSIS — Z8041 Family history of malignant neoplasm of ovary: Secondary | ICD-10-CM | POA: Diagnosis not present

## 2015-02-24 LAB — HCG, SERUM, QUALITATIVE: Preg, Serum: NEGATIVE

## 2015-02-24 LAB — CBC
HCT: 42.2 % (ref 36.0–46.0)
HEMOGLOBIN: 13.5 g/dL (ref 12.0–15.0)
MCH: 27.6 pg (ref 26.0–34.0)
MCHC: 32 g/dL (ref 30.0–36.0)
MCV: 86.3 fL (ref 78.0–100.0)
Platelets: 295 10*3/uL (ref 150–400)
RBC: 4.89 MIL/uL (ref 3.87–5.11)
RDW: 14.2 % (ref 11.5–15.5)
WBC: 6.6 10*3/uL (ref 4.0–10.5)

## 2015-02-24 NOTE — Progress Notes (Signed)
NPO AFTER MN.  ARRIVE AT 0600. NEEDS T & S.   GETTING LAB WORK DONE TODAY (CBC AND SERUM PREG.)

## 2015-02-25 ENCOUNTER — Encounter (HOSPITAL_BASED_OUTPATIENT_CLINIC_OR_DEPARTMENT_OTHER): Payer: Self-pay | Admitting: Anesthesiology

## 2015-02-25 NOTE — H&P (Signed)
Alejandra Harmon is an 54 y.o. female G3P1 with pelvic pain, endometriosis, positive heterozygous PALB, positive BRIP1 and PMS2. U/S in office had 2.7 and 2.3 cm cysts of right ovary possible endometriomas or hemorrhagic cysts.  Pertinent Gynecological History: Menses: flow is moderate Bleeding: N/A Contraception: abstinence DES exposure: denies Blood transfusions: none Sexually transmitted diseases: no past history Previous GYN Procedures: laparoscopy  Last mammogram: normal Date: 2016 Last pap: normal Date: 2012 OB History: G3, P1   Menstrual History: Menarche age: unknown  Patient's last menstrual period was 02/10/2015 (exact date).    Past Medical History  Diagnosis Date  . Right thyroid nodule   . Pelvic pain in female   . Sciatica of left side   . Cyst of ovary   . History of esophageal reflux   . Allergic rhinitis   . Mild sleep apnea     STUDY 07-27-2009-- POSITIONAL / REM AFFECT---  RECOMMENDATION LOOSE WT.  Marland Kitchen Wears contact lenses     Past Surgical History  Procedure Laterality Date  . Excision benign breast cyst  1980  . Cesarean section  1994  . Laparoscopic cholecystectomy  1998  . Elbow ulnar nerve decompression Right 05-13-2003  . Laparoscopy/  lysis adhesions/  right ovary cystectomy/  albation endometriosis/  chromopertubation  02-18-2004  . Breast enhancement surgery Bilateral 12-16-2008  . Colonoscopy  last one 12-23-2013  . Transthoracic echocardiogram  07-07-2009    grade 1 diastolic dysfunction, ef 16-10%/  mild AR and TR/  trivial MR    Family History  Problem Relation Age of Onset  . Diabetes Other   . Hypertension Other   . Cancer Other     ovarian cancer  . Diabetes Mother   . Hypertension Mother   . Breast cancer Mother 34  . Renal cancer Mother 45  . Breast cancer Sister 26  . Diabetes Brother   . Leukemia Brother   . Diabetes Brother   . Prostate cancer Brother 28  . Diabetes Brother   . Prostate cancer Brother 15  . Diabetes  Brother   . Diabetes Brother   . Diabetes Brother   . Diabetes Sister   . Diabetes Sister   . Diabetes Sister   . Breast cancer Sister 33    PALB2+  . Bone cancer Maternal Aunt   . Prostate cancer Maternal Uncle   . Prostate cancer Paternal Uncle   . Breast cancer Maternal Aunt 42  . Colon cancer Neg Hx     Social History:  reports that she has never smoked. She has never used smokeless tobacco. She reports that she does not drink alcohol or use illicit drugs.  Allergies:  Allergies  Allergen Reactions  . Apple Anaphylaxis  . Bactrim [Sulfamethoxazole-Trimethoprim] Hives    No prescriptions prior to admission    ROS  Height '5\' 2"'$  (1.575 m), weight 187 lb (84.823 kg), last menstrual period 02/10/2015. Physical Exam  Lungs CTA Cor RRR Abdomen NT without masses Pelvic uterus 8 weeks size and NT            Adnexa NT  No results found for this or any previous visit (from the past 24 hour(s)).  US Breast Ltd Uni Right Inc Axilla  02/24/2015  CLINICAL DATA:  Increased fullness in the axillary tail region of the right breast and in the right axilla. The patient has had previous bilateral breast reductions. She has tested positive for the PALB2 gene. Strong family history of breast cancer. The patient  plans to give annual screening MRI of the breasts. EXAM: DIGITAL DIAGNOSTIC RIGHT MAMMOGRAM WITH 3D TOMOSYNTHESIS WITH CAD ULTRASOUND RIGHT BREAST COMPARISON:  Previous exam(s). ACR Breast Density Category b: There are scattered areas of fibroglandular density. FINDINGS: Stable elongated, oval area of calcified fat necrosis in the posterior aspect of the upper-outer right breast, at the inferior extent of the area felt by the patient, marked with a metallic marker. Stable normal appearing right axillary lymph nodes. No interval findings suspicious for malignancy. Mammographic images were processed with CAD. On physical exam, the area calcified fat necrosis in the axillary tail region of  the right breast is palpable. No other abnormalities are palpable in that portion of the breast or in the right axilla. Targeted ultrasound is performed, showing the area of calcified fat necrosis in the axillary tail region of the right breast. Normal appearing right axillary lymph nodes are also noted. IMPRESSION: No evidence of malignancy. RECOMMENDATION: Bilateral screening mammogram in 3 months. That will be 1 year since mammographic evaluation of the left breast. I have discussed the findings and recommendations with the patient. Results were also provided in writing at the conclusion of the visit. If applicable, a reminder letter will be sent to the patient regarding the next appointment. BI-RADS CATEGORY  2: Benign. Electronically Signed   By: Claudie Revering M.D.   On: 02/24/2015 12:20   Mm Diag Breast Tomo Uni Right  02/24/2015  CLINICAL DATA:  Increased fullness in the axillary tail region of the right breast and in the right axilla. The patient has had previous bilateral breast reductions. She has tested positive for the PALB2 gene. Strong family history of breast cancer. The patient plans to give annual screening MRI of the breasts. EXAM: DIGITAL DIAGNOSTIC RIGHT MAMMOGRAM WITH 3D TOMOSYNTHESIS WITH CAD ULTRASOUND RIGHT BREAST COMPARISON:  Previous exam(s). ACR Breast Density Category b: There are scattered areas of fibroglandular density. FINDINGS: Stable elongated, oval area of calcified fat necrosis in the posterior aspect of the upper-outer right breast, at the inferior extent of the area felt by the patient, marked with a metallic marker. Stable normal appearing right axillary lymph nodes. No interval findings suspicious for malignancy. Mammographic images were processed with CAD. On physical exam, the area calcified fat necrosis in the axillary tail region of the right breast is palpable. No other abnormalities are palpable in that portion of the breast or in the right axilla. Targeted ultrasound  is performed, showing the area of calcified fat necrosis in the axillary tail region of the right breast. Normal appearing right axillary lymph nodes are also noted. IMPRESSION: No evidence of malignancy. RECOMMENDATION: Bilateral screening mammogram in 3 months. That will be 1 year since mammographic evaluation of the left breast. I have discussed the findings and recommendations with the patient. Results were also provided in writing at the conclusion of the visit. If applicable, a reminder letter will be sent to the patient regarding the next appointment. BI-RADS CATEGORY  2: Benign. Electronically Signed   By: Claudie Revering M.D.   On: 02/24/2015 12:20    Assessment/Plan: 54 yo G3P1 with endometrioisis, pelvic pain and positive heterozygous PALB, BRIP1 and PMS2 LAVH/BSO, possible TAH/BSO D/W patient procedure and risks including infection,organ damage, bleeding/transfusion-HIV/Hep, DVT/PE, pneumonia, fistula, pelvic pain , abdominal pain, painful intercourse, return to OR Patient states she understands and agrees  Alejandra Harmon,Alejandra Harmon 02/25/2015, 7:08 PM

## 2015-02-25 NOTE — Anesthesia Preprocedure Evaluation (Addendum)
Anesthesia Evaluation  Patient identified by MRN, date of birth, ID band Patient awake    Reviewed: Allergy & Precautions, NPO status , Patient's Chart, lab work & pertinent test results  Airway Mallampati: II  TM Distance: >3 FB Neck ROM: Full    Dental no notable dental hx.    Pulmonary sleep apnea ,    Pulmonary exam normal breath sounds clear to auscultation       Cardiovascular negative cardio ROS Normal cardiovascular exam Rhythm:Regular Rate:Normal     Neuro/Psych PSYCHIATRIC DISORDERS Anxiety Depression TIA Neuromuscular disease    GI/Hepatic Neg liver ROS, GERD  ,  Endo/Other  negative endocrine ROS  Renal/GU negative Renal ROS  negative genitourinary   Musculoskeletal negative musculoskeletal ROS (+)   Abdominal (+) + obese,   Peds negative pediatric ROS (+)  Hematology negative hematology ROS (+)   Anesthesia Other Findings   Reproductive/Obstetrics negative OB ROS                             Anesthesia Physical Anesthesia Plan  ASA: II  Anesthesia Plan: General   Post-op Pain Management:    Induction: Intravenous  Airway Management Planned: Oral ETT  Additional Equipment:   Intra-op Plan:   Post-operative Plan: Extubation in OR  Informed Consent: I have reviewed the patients History and Physical, chart, labs and discussed the procedure including the risks, benefits and alternatives for the proposed anesthesia with the patient or authorized representative who has indicated his/her understanding and acceptance.   Dental advisory given  Plan Discussed with: CRNA  Anesthesia Plan Comments:         Anesthesia Quick Evaluation

## 2015-02-26 ENCOUNTER — Encounter (HOSPITAL_COMMUNITY)
Admission: RE | Disposition: A | Discharge status: Home / Self Care | Payer: Self-pay | Source: Ambulatory Visit | Attending: Obstetrics and Gynecology

## 2015-02-26 ENCOUNTER — Ambulatory Visit (HOSPITAL_BASED_OUTPATIENT_CLINIC_OR_DEPARTMENT_OTHER): Payer: 59 | Admitting: Anesthesiology

## 2015-02-26 ENCOUNTER — Encounter (HOSPITAL_BASED_OUTPATIENT_CLINIC_OR_DEPARTMENT_OTHER): Payer: Self-pay | Admitting: *Deleted

## 2015-02-26 ENCOUNTER — Observation Stay (HOSPITAL_COMMUNITY)
Admission: RE | Admit: 2015-02-26 | Discharge: 2015-02-27 | Disposition: A | Discharge status: Home / Self Care | Payer: 59 | Source: Ambulatory Visit | Attending: Obstetrics and Gynecology | Admitting: Obstetrics and Gynecology

## 2015-02-26 DIAGNOSIS — E041 Nontoxic single thyroid nodule: Secondary | ICD-10-CM | POA: Insufficient documentation

## 2015-02-26 DIAGNOSIS — N800 Endometriosis of uterus: Principal | ICD-10-CM | POA: Insufficient documentation

## 2015-02-26 DIAGNOSIS — Z803 Family history of malignant neoplasm of breast: Secondary | ICD-10-CM | POA: Insufficient documentation

## 2015-02-26 DIAGNOSIS — Z8041 Family history of malignant neoplasm of ovary: Secondary | ICD-10-CM | POA: Insufficient documentation

## 2015-02-26 DIAGNOSIS — E669 Obesity, unspecified: Secondary | ICD-10-CM | POA: Insufficient documentation

## 2015-02-26 DIAGNOSIS — G709 Myoneural disorder, unspecified: Secondary | ICD-10-CM | POA: Insufficient documentation

## 2015-02-26 DIAGNOSIS — K219 Gastro-esophageal reflux disease without esophagitis: Secondary | ICD-10-CM | POA: Insufficient documentation

## 2015-02-26 DIAGNOSIS — N8302 Follicular cyst of left ovary: Secondary | ICD-10-CM | POA: Insufficient documentation

## 2015-02-26 DIAGNOSIS — Z6833 Body mass index (BMI) 33.0-33.9, adult: Secondary | ICD-10-CM | POA: Insufficient documentation

## 2015-02-26 DIAGNOSIS — N888 Other specified noninflammatory disorders of cervix uteri: Secondary | ICD-10-CM | POA: Insufficient documentation

## 2015-02-26 DIAGNOSIS — N809 Endometriosis, unspecified: Secondary | ICD-10-CM | POA: Diagnosis present

## 2015-02-26 DIAGNOSIS — G4730 Sleep apnea, unspecified: Secondary | ICD-10-CM | POA: Insufficient documentation

## 2015-02-26 DIAGNOSIS — N736 Female pelvic peritoneal adhesions (postinfective): Secondary | ICD-10-CM | POA: Insufficient documentation

## 2015-02-26 HISTORY — PX: LAPAROSCOPIC BILATERAL SALPINGO OOPHERECTOMY: SHX5890

## 2015-02-26 HISTORY — DX: Allergic rhinitis, unspecified: J30.9

## 2015-02-26 HISTORY — DX: Sleep apnea, unspecified: G47.30

## 2015-02-26 HISTORY — DX: Pelvic and perineal pain: R10.2

## 2015-02-26 HISTORY — DX: Unspecified ovarian cyst, unspecified side: N83.209

## 2015-02-26 HISTORY — DX: Sciatica, left side: M54.32

## 2015-02-26 HISTORY — DX: Personal history of other diseases of the digestive system: Z87.19

## 2015-02-26 HISTORY — PX: LAPAROSCOPIC ASSISTED VAGINAL HYSTERECTOMY: SHX5398

## 2015-02-26 HISTORY — DX: Presence of spectacles and contact lenses: Z97.3

## 2015-02-26 LAB — TYPE AND SCREEN
ABO/RH(D): O POS
Antibody Screen: NEGATIVE

## 2015-02-26 LAB — ABO/RH: ABO/RH(D): O POS

## 2015-02-26 SURGERY — HYSTERECTOMY, VAGINAL, LAPAROSCOPY-ASSISTED
Anesthesia: General | Site: Abdomen

## 2015-02-26 MED ORDER — MENTHOL 3 MG MT LOZG
1.0000 | LOZENGE | OROMUCOSAL | Status: DC | PRN
  Filled 2015-02-26: qty 9

## 2015-02-26 MED ORDER — LIDOCAINE HCL (CARDIAC) 20 MG/ML IV SOLN
INTRAVENOUS | Status: AC
  Filled 2015-02-26: qty 5

## 2015-02-26 MED ORDER — LACTATED RINGERS IV SOLN
INTRAVENOUS | Status: DC
  Administered 2015-02-26 – 2015-02-27 (×2): via INTRAVENOUS

## 2015-02-26 MED ORDER — HYDROMORPHONE HCL 1 MG/ML IJ SOLN
0.2500 mg | INTRAMUSCULAR | Status: DC | PRN
  Administered 2015-02-26 (×4): 0.25 mg via INTRAVENOUS
  Filled 2015-02-26: qty 1

## 2015-02-26 MED ORDER — ROCURONIUM BROMIDE 100 MG/10ML IV SOLN
INTRAVENOUS | Status: DC | PRN
  Administered 2015-02-26: 30 mg via INTRAVENOUS

## 2015-02-26 MED ORDER — ACETAMINOPHEN 500 MG PO TABS
ORAL_TABLET | ORAL | Status: AC
  Filled 2015-02-26: qty 2

## 2015-02-26 MED ORDER — DEXAMETHASONE SODIUM PHOSPHATE 10 MG/ML IJ SOLN
INTRAMUSCULAR | Status: AC
  Filled 2015-02-26: qty 1

## 2015-02-26 MED ORDER — MIDAZOLAM HCL 5 MG/5ML IJ SOLN
INTRAMUSCULAR | Status: DC | PRN
  Administered 2015-02-26: 2 mg via INTRAVENOUS

## 2015-02-26 MED ORDER — PROMETHAZINE HCL 25 MG/ML IJ SOLN
6.2500 mg | INTRAMUSCULAR | Status: DC | PRN
  Filled 2015-02-26: qty 1

## 2015-02-26 MED ORDER — ONDANSETRON HCL 4 MG/2ML IJ SOLN: 4.0000 mg | Freq: Four times a day (QID) | INTRAMUSCULAR | Status: DC | PRN

## 2015-02-26 MED ORDER — ARTIFICIAL TEARS OP OINT
TOPICAL_OINTMENT | OPHTHALMIC | Status: AC
  Filled 2015-02-26: qty 3.5

## 2015-02-26 MED ORDER — SODIUM CHLORIDE 0.9 % IR SOLN
Status: DC | PRN
  Administered 2015-02-26: 1000 mL

## 2015-02-26 MED ORDER — ONDANSETRON HCL 4 MG/2ML IJ SOLN
INTRAMUSCULAR | Status: AC
  Filled 2015-02-26: qty 2

## 2015-02-26 MED ORDER — LIDOCAINE HCL 4 % MT SOLN
OROMUCOSAL | Status: DC | PRN
  Administered 2015-02-26: 3 mL via TOPICAL

## 2015-02-26 MED ORDER — CEFAZOLIN SODIUM-DEXTROSE 2-3 GM-% IV SOLR
INTRAVENOUS | Status: AC
  Filled 2015-02-26: qty 50

## 2015-02-26 MED ORDER — GLYCOPYRROLATE 0.2 MG/ML IJ SOLN
INTRAMUSCULAR | Status: AC
  Filled 2015-02-26: qty 3

## 2015-02-26 MED ORDER — NEOSTIGMINE METHYLSULFATE 10 MG/10ML IV SOLN
INTRAVENOUS | Status: AC
  Filled 2015-02-26: qty 1

## 2015-02-26 MED ORDER — KETOROLAC TROMETHAMINE 30 MG/ML IJ SOLN
INTRAMUSCULAR | Status: AC
  Filled 2015-02-26: qty 1

## 2015-02-26 MED ORDER — HYDROMORPHONE 1 MG/ML IV SOLN
INTRAVENOUS | Status: DC
  Administered 2015-02-27: 1 mg via INTRAVENOUS
  Administered 2015-02-27: 2 mg via INTRAVENOUS

## 2015-02-26 MED ORDER — PROPOFOL 10 MG/ML IV BOLUS
INTRAVENOUS | Status: AC
  Filled 2015-02-26: qty 20

## 2015-02-26 MED ORDER — HYDROMORPHONE 1 MG/ML IV SOLN
INTRAVENOUS | Status: DC
Start: 2015-02-26 — End: 2015-02-26
  Administered 2015-02-26: 1.9 mg via INTRAVENOUS
  Administered 2015-02-26: 0.2 mg via INTRAVENOUS
  Filled 2015-02-26 (×2): qty 25

## 2015-02-26 MED ORDER — DIPHENHYDRAMINE HCL 50 MG/ML IJ SOLN
12.5000 mg | Freq: Four times a day (QID) | INTRAMUSCULAR | Status: DC | PRN
  Filled 2015-02-26: qty 0.25

## 2015-02-26 MED ORDER — MIDAZOLAM HCL 2 MG/2ML IJ SOLN
INTRAMUSCULAR | Status: AC
  Filled 2015-02-26: qty 2

## 2015-02-26 MED ORDER — BUPIVACAINE HCL (PF) 0.5 % IJ SOLN
INTRAMUSCULAR | Status: AC
  Filled 2015-02-26: qty 30

## 2015-02-26 MED ORDER — LACTATED RINGERS IR SOLN
Status: DC | PRN
  Administered 2015-02-26: 3000 mL

## 2015-02-26 MED ORDER — BUPIVACAINE LIPOSOME 1.3 % IJ SUSP
INTRAMUSCULAR | Status: AC
  Filled 2015-02-26: qty 20

## 2015-02-26 MED ORDER — ACETAMINOPHEN 500 MG PO TABS
1000.0000 mg | ORAL_TABLET | Freq: Once | ORAL | Status: AC
  Administered 2015-02-26: 1000 mg via ORAL
  Filled 2015-02-26: qty 2

## 2015-02-26 MED ORDER — SODIUM CHLORIDE 0.9 % IJ SOLN
9.0000 mL | INTRAMUSCULAR | Status: DC | PRN
  Filled 2015-02-26: qty 9

## 2015-02-26 MED ORDER — SUCCINYLCHOLINE CHLORIDE 20 MG/ML IJ SOLN
INTRAMUSCULAR | Status: DC | PRN
  Administered 2015-02-26: 100 mg via INTRAVENOUS

## 2015-02-26 MED ORDER — ONDANSETRON HCL 4 MG PO TABS: 4.0000 mg | ORAL_TABLET | Freq: Four times a day (QID) | ORAL | Status: DC | PRN

## 2015-02-26 MED ORDER — ROCURONIUM BROMIDE 100 MG/10ML IV SOLN
INTRAVENOUS | Status: AC
  Filled 2015-02-26: qty 1

## 2015-02-26 MED ORDER — BUPIVACAINE HCL (PF) 0.5 % IJ SOLN
INTRAMUSCULAR | Status: DC | PRN
  Administered 2015-02-26: 30 mL

## 2015-02-26 MED ORDER — FENTANYL CITRATE (PF) 100 MCG/2ML IJ SOLN
INTRAMUSCULAR | Status: DC | PRN
  Administered 2015-02-26 (×4): 50 ug via INTRAVENOUS

## 2015-02-26 MED ORDER — PHENYLEPHRINE 40 MCG/ML (10ML) SYRINGE FOR IV PUSH (FOR BLOOD PRESSURE SUPPORT)
PREFILLED_SYRINGE | INTRAVENOUS | Status: AC
  Filled 2015-02-26: qty 10

## 2015-02-26 MED ORDER — DIPHENHYDRAMINE HCL 12.5 MG/5ML PO ELIX
12.5000 mg | ORAL_SOLUTION | Freq: Four times a day (QID) | ORAL | Status: DC | PRN
  Administered 2015-02-27: 12.5 mg via ORAL
  Filled 2015-02-26 (×2): qty 5

## 2015-02-26 MED ORDER — SENNA 8.6 MG PO TABS
1.0000 | ORAL_TABLET | Freq: Two times a day (BID) | ORAL | Status: DC
  Administered 2015-02-26 – 2015-02-27 (×2): 8.6 mg via ORAL

## 2015-02-26 MED ORDER — ONDANSETRON HCL 4 MG/2ML IJ SOLN
4.0000 mg | Freq: Four times a day (QID) | INTRAMUSCULAR | Status: DC | PRN
  Administered 2015-02-27: 4 mg via INTRAVENOUS
  Filled 2015-02-26 (×3): qty 2

## 2015-02-26 MED ORDER — PROPOFOL 10 MG/ML IV BOLUS
INTRAVENOUS | Status: DC | PRN
  Administered 2015-02-26: 150 mg via INTRAVENOUS

## 2015-02-26 MED ORDER — CEFAZOLIN SODIUM-DEXTROSE 2-3 GM-% IV SOLR
2.0000 g | INTRAVENOUS | Status: AC
  Administered 2015-02-26: 2 g via INTRAVENOUS
  Filled 2015-02-26: qty 50

## 2015-02-26 MED ORDER — DEXAMETHASONE SODIUM PHOSPHATE 4 MG/ML IJ SOLN
INTRAMUSCULAR | Status: DC | PRN
  Administered 2015-02-26: 10 mg via INTRAVENOUS

## 2015-02-26 MED ORDER — NALOXONE HCL 0.4 MG/ML IJ SOLN
0.4000 mg | INTRAMUSCULAR | Status: DC | PRN
  Filled 2015-02-26: qty 1

## 2015-02-26 MED ORDER — LACTATED RINGERS IV SOLN
INTRAVENOUS | Status: DC
  Administered 2015-02-26 (×2): via INTRAVENOUS
  Filled 2015-02-26: qty 1000

## 2015-02-26 MED ORDER — SIMETHICONE 80 MG PO CHEW
80.0000 mg | CHEWABLE_TABLET | Freq: Four times a day (QID) | ORAL | Status: DC | PRN
  Filled 2015-02-26: qty 1

## 2015-02-26 MED ORDER — PHENYLEPHRINE HCL 10 MG/ML IJ SOLN
INTRAMUSCULAR | Status: DC | PRN
  Administered 2015-02-26 (×3): 40 ug via INTRAVENOUS

## 2015-02-26 MED ORDER — NEOSTIGMINE METHYLSULFATE 10 MG/10ML IV SOLN
INTRAVENOUS | Status: DC | PRN
  Administered 2015-02-26: 4 mg via INTRAVENOUS

## 2015-02-26 MED ORDER — HYDROMORPHONE HCL 1 MG/ML IJ SOLN
INTRAMUSCULAR | Status: AC
  Filled 2015-02-26: qty 1

## 2015-02-26 MED ORDER — OXYCODONE-ACETAMINOPHEN 5-325 MG PO TABS
1.0000 | ORAL_TABLET | ORAL | Status: DC | PRN
  Administered 2015-02-27: 1 via ORAL
  Filled 2015-02-26: qty 1

## 2015-02-26 MED ORDER — ZOLPIDEM TARTRATE 5 MG PO TABS: 5.0000 mg | ORAL_TABLET | Freq: Every evening | ORAL | Status: DC | PRN

## 2015-02-26 MED ORDER — GLYCOPYRROLATE 0.2 MG/ML IJ SOLN
INTRAMUSCULAR | Status: DC | PRN
  Administered 2015-02-26: 0.6 mg via INTRAVENOUS

## 2015-02-26 MED ORDER — FENTANYL CITRATE (PF) 250 MCG/5ML IJ SOLN
INTRAMUSCULAR | Status: AC
  Filled 2015-02-26: qty 5

## 2015-02-26 MED ORDER — ONDANSETRON HCL 4 MG/2ML IJ SOLN
INTRAMUSCULAR | Status: DC | PRN
  Administered 2015-02-26: 4 mg via INTRAVENOUS

## 2015-02-26 MED ORDER — MAGNESIUM HYDROXIDE 400 MG/5ML PO SUSP: 30.0000 mL | Freq: Every day | ORAL | Status: DC | PRN

## 2015-02-26 MED ORDER — HEPARIN SOD (PORK) LOCK FLUSH 100 UNIT/ML IV SOLN
INTRAVENOUS | Status: AC
  Filled 2015-02-26: qty 5

## 2015-02-26 MED ORDER — IBUPROFEN 200 MG PO TABS
600.0000 mg | ORAL_TABLET | Freq: Four times a day (QID) | ORAL | Status: DC | PRN
Start: 2015-02-26 — End: 2015-02-27
  Administered 2015-02-27: 600 mg via ORAL
  Filled 2015-02-26: qty 3

## 2015-02-26 MED ORDER — LIDOCAINE HCL (CARDIAC) 20 MG/ML IV SOLN
INTRAVENOUS | Status: DC | PRN
  Administered 2015-02-26: 60 mg via INTRAVENOUS

## 2015-02-26 SURGICAL SUPPLY — 99 items
APL SKNCLS STERI-STRIP NONHPOA (GAUZE/BANDAGES/DRESSINGS)
APPLICATOR COTTON TIP 6IN STRL (MISCELLANEOUS) ×5 IMPLANT
BAG DRN ANRFLXCHMBR STRAP LEK (BAG)
BAG SPEC RTRVL LRG 6X4 10 (ENDOMECHANICALS)
BAG URINE DRAINAGE (UROLOGICAL SUPPLIES) ×1 IMPLANT
BAG URINE LEG 19OZ MD ST LTX (BAG) IMPLANT
BENZOIN TINCTURE PRP APPL 2/3 (GAUZE/BANDAGES/DRESSINGS) IMPLANT
BLADE CLIPPER SURG (BLADE) IMPLANT
BLADE HEX COATED 2.75 (ELECTRODE) IMPLANT
BLADE SURG 11 STRL SS (BLADE) ×5 IMPLANT
BLADE SURG 15 STRL LF DISP TIS (BLADE) ×2 IMPLANT
BLADE SURG 15 STRL SS (BLADE) ×5
CANISTER SUCTION 2500CC (MISCELLANEOUS) ×5 IMPLANT
CATH ROBINSON RED A/P 16FR (CATHETERS) ×5 IMPLANT
CLOSURE WOUND 1/4 X3 (GAUZE/BANDAGES/DRESSINGS)
CLOSURE WOUND 1/4X4 (GAUZE/BANDAGES/DRESSINGS) ×1
CLOTH BEACON ORANGE TIMEOUT ST (SAFETY) ×5 IMPLANT
COVER BACK TABLE 60X90IN (DRAPES) ×10 IMPLANT
COVER MAYO STAND STRL (DRAPES) ×10 IMPLANT
DECANTER SPIKE VIAL GLASS SM (MISCELLANEOUS) IMPLANT
DRAPE LAPAROTOMY T 102X78X121 (DRAPES) ×5 IMPLANT
DRAPE LG THREE QUARTER DISP (DRAPES) ×5 IMPLANT
DRAPE UNDERBUTTOCKS STRL (DRAPE) ×5 IMPLANT
DRAPE WARM FLUID 44X44 (DRAPE) ×5 IMPLANT
DRSG TELFA 3X8 NADH (GAUZE/BANDAGES/DRESSINGS) IMPLANT
ELECT BLADE 6.5 .24CM SHAFT (ELECTRODE) IMPLANT
ELECT LIGASURE LONG (ELECTRODE) ×5 IMPLANT
ELECT LIGASURE SHORT 9 REUSE (ELECTRODE) IMPLANT
ELECT REM PT RETURN 9FT ADLT (ELECTROSURGICAL) ×5
ELECTRODE REM PT RTRN 9FT ADLT (ELECTROSURGICAL) ×2 IMPLANT
GLOVE BIO SURGEON STRL SZ8 (GLOVE) ×15 IMPLANT
GLOVE BIOGEL PI IND STRL 6.5 (GLOVE) ×3 IMPLANT
GLOVE BIOGEL PI INDICATOR 6.5 (GLOVE) ×2
GLOVE ECLIPSE 6.5 STRL STRAW (GLOVE) ×5 IMPLANT
GOWN STRL REUS W/ TWL LRG LVL3 (GOWN DISPOSABLE) ×7 IMPLANT
GOWN STRL REUS W/ TWL XL LVL3 (GOWN DISPOSABLE) ×6 IMPLANT
GOWN STRL REUS W/TWL LRG LVL3 (GOWN DISPOSABLE) ×10
GOWN STRL REUS W/TWL XL LVL3 (GOWN DISPOSABLE) ×10
HOLDER FOLEY CATH W/STRAP (MISCELLANEOUS) ×5 IMPLANT
KIT ROOM TURNOVER WOR (KITS) ×5 IMPLANT
LIGASURE LAP L-HOOKWIRE 5 44CM (INSTRUMENTS) IMPLANT
LIQUID BAND (GAUZE/BANDAGES/DRESSINGS) ×5 IMPLANT
MANIFOLD NEPTUNE II (INSTRUMENTS) IMPLANT
NDL HYPO 25X1 1.5 SAFETY (NEEDLE) ×1 IMPLANT
NDL INSUFFLATION 14GA 120MM (NEEDLE) ×1 IMPLANT
NDL INSUFFLATION 14GA 150MM (NEEDLE) IMPLANT
NDL SPNL 22GX3.5 QUINCKE BK (NEEDLE) IMPLANT
NEEDLE HYPO 25X1 1.5 SAFETY (NEEDLE) ×5 IMPLANT
NEEDLE INSUFFLATION 14GA 120MM (NEEDLE) ×5 IMPLANT
NEEDLE INSUFFLATION 14GA 150MM (NEEDLE) IMPLANT
NEEDLE SPNL 22GX3.5 QUINCKE BK (NEEDLE) IMPLANT
NS IRRIG 1000ML POUR BTL (IV SOLUTION) ×5 IMPLANT
NS IRRIG 500ML POUR BTL (IV SOLUTION) IMPLANT
PACK BASIN DAY SURGERY FS (CUSTOM PROCEDURE TRAY) ×5 IMPLANT
PAD OB MATERNITY 4.3X12.25 (PERSONAL CARE ITEMS) ×5 IMPLANT
PAD PREP 24X48 CUFFED NSTRL (MISCELLANEOUS) ×5 IMPLANT
PADDING ION DISPOSABLE (MISCELLANEOUS) ×5 IMPLANT
PENCIL BUTTON HOLSTER BLD 10FT (ELECTRODE) ×5 IMPLANT
POUCH SPECIMEN RETRIEVAL 10MM (ENDOMECHANICALS) IMPLANT
SCISSORS LAP 5X35 DISP (ENDOMECHANICALS) IMPLANT
SCISSORS LAP 5X45 EPIX DISP (ENDOMECHANICALS) IMPLANT
SEALER TISSUE G2 CVD JAW 45CM (ENDOMECHANICALS) ×5 IMPLANT
SET IRRIG TUBING LAPAROSCOPIC (IRRIGATION / IRRIGATOR) ×5 IMPLANT
SHEET LAVH (DRAPES) ×5 IMPLANT
SOLUTION ANTI FOG 6CC (MISCELLANEOUS) ×5 IMPLANT
SOLUTION ELECTROLUBE (MISCELLANEOUS) IMPLANT
SPONGE LAP 18X18 X RAY DECT (DISPOSABLE) ×2 IMPLANT
SPONGE LAP 4X18 X RAY DECT (DISPOSABLE) ×5 IMPLANT
STAPLER VISISTAT 35W (STAPLE) ×5 IMPLANT
STRIP CLOSURE SKIN 1/4X3 (GAUZE/BANDAGES/DRESSINGS) IMPLANT
STRIP CLOSURE SKIN 1/4X4 (GAUZE/BANDAGES/DRESSINGS) ×4 IMPLANT
SUT MNCRL 0 MO-4 VIOLET 18 CR (SUTURE) ×6 IMPLANT
SUT MNCRL 0 VIOLET 6X18 (SUTURE) ×3 IMPLANT
SUT MNCRL AB 0 CT1 27 (SUTURE) ×5 IMPLANT
SUT MON AB-0 CT1 36 (SUTURE) ×5 IMPLANT
SUT MONOCRYL 0 6X18 (SUTURE) ×2
SUT MONOCRYL 0 MO 4 18  CR/8 (SUTURE) ×4
SUT PDS AB 0 CTX 60 (SUTURE) ×10 IMPLANT
SUT PLAIN 2 0 XLH (SUTURE) IMPLANT
SUT VICRYL 0 UR6 27IN ABS (SUTURE) ×5 IMPLANT
SUT VICRYL 4-0 PS2 18IN ABS (SUTURE) ×5 IMPLANT
SYR 30ML LL (SYRINGE) ×5 IMPLANT
SYR 3ML 23GX1 SAFETY (SYRINGE) IMPLANT
SYR 5ML LL (SYRINGE) ×5 IMPLANT
SYR BULB IRRIGATION 50ML (SYRINGE) ×5 IMPLANT
SYR CONTROL 10ML LL (SYRINGE) ×5 IMPLANT
SYRINGE 10CC LL (SYRINGE) ×10 IMPLANT
TOWEL OR 17X24 6PK STRL BLUE (TOWEL DISPOSABLE) ×10 IMPLANT
TRAY DSU PREP LF (CUSTOM PROCEDURE TRAY) ×5 IMPLANT
TRAY FOLEY CATH 14FR (SET/KITS/TRAYS/PACK) ×5 IMPLANT
TRAY FOLEY CATH SILVER 14FR (SET/KITS/TRAYS/PACK) ×5 IMPLANT
TROCAR XCEL NON-BLD 11X100MML (ENDOMECHANICALS) ×5 IMPLANT
TROCAR XCEL NON-BLD 5MMX100MML (ENDOMECHANICALS) ×5 IMPLANT
TUBE CONNECTING 12'X1/4 (SUCTIONS) ×2
TUBE CONNECTING 12X1/4 (SUCTIONS) ×8 IMPLANT
TUBING INSUFFLATION 10FT LAP (TUBING) ×5 IMPLANT
WARMER LAPAROSCOPE (MISCELLANEOUS) ×5 IMPLANT
WATER STERILE IRR 500ML POUR (IV SOLUTION) ×5 IMPLANT
YANKAUER SUCT BULB TIP NO VENT (SUCTIONS) ×5 IMPLANT

## 2015-02-26 NOTE — Anesthesia Postprocedure Evaluation (Signed)
Anesthesia Post Note  Patient: Alejandra Harmon  Procedure(s) Performed: Procedure(s) (LRB): LAPAROSCOPIC ASSISTED VAGINAL HYSTERECTOMY  (N/A) LAPAROSCOPIC BILATERAL SALPINGO OOPHORECTOMY (Bilateral)  Patient location during evaluation: PACU Anesthesia Type: General Level of consciousness: awake and alert Pain management: pain level controlled Vital Signs Assessment: post-procedure vital signs reviewed and stable Respiratory status: spontaneous breathing, nonlabored ventilation, respiratory function stable and patient connected to nasal cannula oxygen Cardiovascular status: blood pressure returned to baseline and stable Postop Assessment: no signs of nausea or vomiting Anesthetic complications: no    Last Vitals:  Filed Vitals:   02/26/15 1000 02/26/15 1015  BP: 125/72 117/70  Pulse: 54 60  Temp:    Resp: 11 14    Last Pain:  Filed Vitals:   02/26/15 1018  PainSc: 8                  Geordie Nooney J

## 2015-02-26 NOTE — Transfer of Care (Signed)
Last Vitals:  Filed Vitals:   02/26/15 0620  BP: 138/86  Pulse: 82  Temp: 37 C  Resp: 16    Immediate Anesthesia Transfer of Care Note  Patient: Alejandra Harmon  Procedure(s) Performed: Procedure(s) (LRB): LAPAROSCOPIC ASSISTED VAGINAL HYSTERECTOMY  (N/A) LAPAROSCOPIC BILATERAL SALPINGO OOPHORECTOMY (Bilateral)  Patient Location: PACU  Anesthesia Type: General  Level of Consciousness: awake, alert  and oriented  Airway & Oxygen Therapy: Patient Spontanous Breathing and Patient connected to nasal cannula oxygen  Post-op Assessment: Report given to PACU RN and Post -op Vital signs reviewed and stable  Post vital signs: Reviewed and stable  Complications: No apparent anesthesia complications

## 2015-02-26 NOTE — Progress Notes (Signed)
Good pain relief, ambulating, tolerating regular diet  VSS Afeb Lungs CTA Cor RRR  Abdomen soft, BS + LE  PAS on  A/P: Stable

## 2015-02-26 NOTE — Progress Notes (Signed)
No changes to H&P per patient history Reviewed with patient procedure-LAVH/BSO, poss TAH/BSO All questions answered Patient states she understands and agrees

## 2015-02-26 NOTE — Progress Notes (Signed)
02/26/2015  9:14 AM  PATIENT:  Alejandra Harmon  54 y.o. female  PRE-OPERATIVE DIAGNOSIS:  ovarian cysts, pelvic pain  POST-OPERATIVE DIAGNOSIS:  ovarian cysts, pelvic pain  PROCEDURE:  Laparoscopic vaginal hysterectomy, bilateral salpingo-oophorectomy  SURGEON:  Surgeon(s) and Role:    * Everlene Farrier, MD - Primary    * Louretta Shorten, MD - Assisting  PHYSICIAN ASSISTANT:   ASSISTANTS: none   ANESTHESIA:   general  EBL:  Total I/O In: 1000 [I.V.:1000] Out: 100 [Blood:100]  BLOOD ADMINISTERED:none  DRAINS: Urinary Catheter (Foley)   LOCAL MEDICATIONS USED:  MARCAINE    and Amount: 30 ml  SPECIMEN:  Source of Specimen:  uterus , bilateral tubes/ovaries  DISPOSITION OF SPECIMEN:  PATHOLOGY  COUNTS:  YES  TOURNIQUET:  * No tourniquets in log *  DICTATION: .Other Dictation: Dictation Number [id number]  PLAN OF CARE: Admit for overnight observation  PATIENT DISPOSITION:  PACU - hemodynamically stable.   Delay start of Pharmacological VTE agent (>24hrs) due to surgical blood loss or risk of bleeding: not applicable

## 2015-02-26 NOTE — Anesthesia Procedure Notes (Signed)
Procedure Name: Intubation Date/Time: 02/26/2015 7:36 AM Performed by: Mechele Claude Pre-anesthesia Checklist: Patient identified, Emergency Drugs available, Suction available and Patient being monitored Patient Re-evaluated:Patient Re-evaluated prior to inductionOxygen Delivery Method: Circle System Utilized Preoxygenation: Pre-oxygenation with 100% oxygen Intubation Type: IV induction Ventilation: Mask ventilation without difficulty Laryngoscope Size: Mac and 3 Grade View: Grade II Tube type: Oral Tube size: 7.0 mm Number of attempts: 1 Airway Equipment and Method: Stylet and LTA kit utilized Placement Confirmation: ETT inserted through vocal cords under direct vision,  positive ETCO2 and breath sounds checked- equal and bilateral Secured at: 20 cm Tube secured with: Tape Dental Injury: Teeth and Oropharynx as per pre-operative assessment

## 2015-02-27 ENCOUNTER — Encounter (HOSPITAL_BASED_OUTPATIENT_CLINIC_OR_DEPARTMENT_OTHER): Payer: Self-pay | Admitting: Obstetrics and Gynecology

## 2015-02-27 DIAGNOSIS — N800 Endometriosis of uterus: Secondary | ICD-10-CM | POA: Diagnosis not present

## 2015-02-27 LAB — CBC
HCT: 32.9 % — ABNORMAL LOW (ref 36.0–46.0)
HEMOGLOBIN: 10.8 g/dL — AB (ref 12.0–15.0)
MCH: 27.8 pg (ref 26.0–34.0)
MCHC: 32.8 g/dL (ref 30.0–36.0)
MCV: 84.6 fL (ref 78.0–100.0)
PLATELETS: 223 10*3/uL (ref 150–400)
RBC: 3.89 MIL/uL (ref 3.87–5.11)
RDW: 14 % (ref 11.5–15.5)
WBC: 6.8 10*3/uL (ref 4.0–10.5)

## 2015-02-27 MED ORDER — BISACODYL 10 MG RE SUPP
10.0000 mg | Freq: Once | RECTAL | Status: AC
  Administered 2015-02-27: 10 mg via RECTAL
  Filled 2015-02-27: qty 1

## 2015-02-27 MED ORDER — OXYCODONE-ACETAMINOPHEN 5-325 MG PO TABS: 1.0000 | ORAL_TABLET | Freq: Four times a day (QID) | ORAL | Status: DC | PRN

## 2015-02-27 MED ORDER — IBUPROFEN 600 MG PO TABS: 600.0000 mg | ORAL_TABLET | Freq: Four times a day (QID) | ORAL | Status: DC | PRN

## 2015-02-27 NOTE — Progress Notes (Signed)
Feeling good, tolerating food, ambulating, voiding, good pain relief  VSS Afeb Patient smiling  A/P: D/C home         Instructions given

## 2015-02-27 NOTE — Progress Notes (Signed)
Pt d/c home. Pain is minimal. Ambulating well. Voiding without difficulty. Had a small BM. Patient excited about d/c. Prescriptions given. No issues or concerns.

## 2015-02-27 NOTE — Care Management Note (Signed)
Case Management Note  Patient Details  Name: Alejandra Harmon MRN: [id number] Date of Birth: 1960-07-15  Subjective/Objective:                    Action/Plan: Pt admitted with Endometriosis   Expected Discharge Date:  02/25/15               Expected Discharge Plan:  Home/Self Care  In-House Referral:     Discharge planning Services  CM Consult  Post Acute Care Choice:  NA Choice offered to:  NA  DME Arranged:  N/A DME Agency:  NA  HH Arranged:    [place] Agency:     Status of Service:  Completed, signed off  Medicare Important Message Given:    Date Medicare IM Given:    Medicare IM give by:    Date Additional Medicare IM Given:    Additional Medicare Important Message give by:     If discussed at [place] of Stay Meetings, dates discussed:    Additional CommentsPurcell Mouton, RN 02/27/2015, 2:58 PM

## 2015-02-27 NOTE — Discharge Summary (Signed)
Physician Discharge Summary  Patient ID: SALINE DEMETRIUS MRN: [id number] DOB/AGE: 08/06/1960 54 y.o.  Admit date: 02/26/2015 Discharge date: 02/27/2015  Admission Diagnoses:endometriosis  Discharge Diagnoses:  Active Problems:   Endometriosis   Discharged Condition: good  Hospital Course: in am of first post operative day had some vomiting x 1. At lunch, is tolerating regular diet, voiding, ambulating with good pain relief  Consults: None  Significant Diagnostic Studies: labs:  Results for orders placed or performed during the hospital encounter of 02/26/15 (from the past 24 hour(s))  CBC     Status: Abnormal   Collection Time: 02/27/15  4:10 AM  Result Value Ref Range   WBC 6.8 4.0 - 10.5 K/uL   RBC 3.89 3.87 - 5.11 MIL/uL   Hemoglobin 10.8 (L) 12.0 - 15.0 g/dL   HCT 32.9 (L) 36.0 - 46.0 %   MCV 84.6 78.0 - 100.0 fL   MCH 27.8 26.0 - 34.0 pg   MCHC 32.8 30.0 - 36.0 g/dL   RDW 14.0 11.5 - 15.5 %   Platelets 223 150 - 400 K/uL    Treatments: surgery: LAVH/BS  Discharge Exam: Blood pressure 110/61, pulse 54, temperature 98.1 F (36.7 C), temperature source Oral, resp. rate 15, height 5\' 2"  (1.575 m), weight 185 lb (83.915 kg), last menstrual period 02/10/2015, SpO2 98 %. General appearance: alert, cooperative and no distress GI: soft, non-tender; bowel sounds normal; no masses,  no organomegaly  Disposition: 01-Home or Self Care  Discharge Instructions    Discharge patient    Complete by:  As directed             Medication List    TAKE these medications        ibuprofen 600 MG tablet  Commonly known as:  ADVIL,MOTRIN  Take 1 tablet (600 mg total) by mouth every 6 (six) hours as needed (mild pain).     oxyCODONE-acetaminophen 5-325 MG tablet  Commonly known as:  PERCOCET/ROXICET  Take 1-2 tablets by mouth every 6 (six) hours as needed (moderate to severe pain (when tolerating fluids)).           Follow-up Information    Follow up In 2 weeks.       Signed: Chela Sutphen II,Azzam Mehra E 02/27/2015, 1:51 PM

## 2015-02-27 NOTE — Discharge Instructions (Signed)
No vaginal entry No operation of automobiles No heavy lifting

## 2015-02-27 NOTE — Progress Notes (Signed)
Tolerating liquids last pm, this am had N/V after coffee Ambulated last night, none yet today Foley out No flatus yet  VSS Afeb Abdomen soft, BS+  Results for orders placed or performed during the hospital encounter of 02/26/15 (from the past 24 hour(s))  CBC     Status: Abnormal   Collection Time: 02/27/15  4:10 AM  Result Value Ref Range   WBC 6.8 4.0 - 10.5 K/uL   RBC 3.89 3.87 - 5.11 MIL/uL   Hemoglobin 10.8 (L) 12.0 - 15.0 g/dL   HCT 32.9 (L) 36.0 - 46.0 %   MCV 84.6 78.0 - 100.0 fL   MCH 27.8 26.0 - 34.0 pg   MCHC 32.8 30.0 - 36.0 g/dL   RDW 14.0 11.5 - 15.5 %   Platelets 223 150 - 400 K/uL    A/P: Ambulate         ducolax suppository

## 2015-02-27 NOTE — Op Note (Signed)
NAMEDESTINAE, NEUBECKER                ACCOUNT NO.:  [id number]  MEDICAL RECORD NO.:  [id number]  LOCATION:                               FACILITY:  [facility]  PHYSICIAN:  Daleen Bo. Gaetano Net, M.D. DATE OF BIRTH:  1960-07-09  DATE OF PROCEDURE:  02/26/2015 DATE OF DISCHARGE:                              OPERATIVE REPORT   PREOPERATIVE DIAGNOSES: 1. Pelvic pain. 2. History of endometriosis. 3. Positive for heterozygous PALB mutation, BRIP1 and PMS2 mutations.  POSTOPERATIVE DIAGNOSES: 1. Pelvic pain. 2. History of endometriosis. 3. 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  PROCEDURE:  Laparoscopically assisted vaginal hysterectomy with bilateral salpingo-oophorectomy.  SURGEON:  Daleen Bo. Gaetano Net, MD  ASSISTANT:  Monia Sabal. Corinna Capra, MD  ANESTHESIA:  General with endotracheal intubation.  ESTIMATED BLOOD LOSS:  100 mL.  SPECIMENS:  Bilateral fallopian tubes and uterus to Pathology.  INDICATIONS AND CONSENT:  This patient is a 54 year old patient who has known endometriosis and increasing pelvic pain.  She is also known to be heterozygous for the PALB mutation as well as positive for BRIP1 and PMS2 mutations.  Because of increasing pain, as well as the risks with PALB mutation, she lacks definitive therapy.  She has a history of adhesions on laparoscopy.  Laparoscopically assisted vaginal hysterectomy, bilateral salpingo-oophorectomy with possible total abdominal hysterectomy, bilateral salpingo-oophorectomy was discussed preoperatively.  Potential risks and complications were reviewed preoperatively including, but not limited to, infection, organ damage, bleeding requiring transfusion of blood products with HIV and hepatitis acquisition, DVT, PE, pneumonia, fistula formation, pelvic pain, abdominal pain, painful intercourse, returned to the operating room. All questions were answered.  The patient states she understand, consent is signed  on the chart.  FINDINGS:  There were omental adhesions to the anterior abdominal wall above the level of the umbilicus.  In the pelvis, the uterus is 8 week size.  The right ovary is slightly adherent to the pelvic sidewall inferiorly.  The left ovary has a similar finding.  In the posterior cul- de-sac, there is a filmy adhesion on the rectum to the anterior portion of the posterior cul-de-sac.  DESCRIPTION OF PROCEDURE:  The patient was taken to the operating room, where she was identified, placed in dorsal supine position and general anesthesia was induced via endotracheal intubation.  She was placed in a dorsal lithotomy position.  Time-out was undertaken.  She was prepped abdominally and vaginally.  Bladder straight catheterized.  Hulka tenaculum was placed, uterus as a manipulator and she was draped in a sterile fashion.  The infraumbilical and suprapubic areas were injected in midline with about 6 mL of 0.5% plain Marcaine.  Small infraumbilical incision was made.  Disposable Veress needle was placed on the first attempt without difficulty.  Good syringe and drop test were noted.  2 L of gas were then insufflated under low pressure with good tympany in the right upper quadrant.  Then, using the diagnostic laparoscope with the XL 10/12 trocar sleeve was placed under direct visualization through the umbilical incision without difficulty.  Then switching to the operative scope, the small suprapubic incision was made in midline and a 5-mm disposable trocar sleeve was  placed under direct visualization without difficulty.  The above findings were noted.  The course of the ureters was seen to be well clear of the area of surgery.  The ovaries were easily and completely elevated from the pelvic sidewall.  Then, using the EnSeal bipolar cautery cutting instrument, the right infundibulopelvic ligament was taken down.  This was carried across to the round ligament and then down to the level  of vesicouterine peritoneum.  Similar procedure was carried out on the left.  The adhesions in the posterior cul-de-sac were taken down without difficulty.  The vesicouterine peritoneum was then taken down cephalolaterally as well.  Good hemostasis was noted.  Suprapubic trocar sleeve was removed.  Instruments were removed and attention was turned to the vagina.  The cervix was circumscribed with unipolar cautery.  The posterior cul-de-sac was entered.  Using progressive bites with a handheld LigaSure bipolar cautery, the bladder pillars, uterosacral ligaments were taken bilaterally.  Anterior cul-de-sac was also entered without difficulty.  After controlling the vessels, the fundus was delivered posteriorly and the remaining ligaments were taken down without difficulty.  All suture will be 0 Monocryl, unless otherwise designated.  Uterosacral ligaments were then plicated to the vaginal cuff bilaterally and plicated the midline with a third suture.  Cuff was closed with figure-of-eights.  Foley catheter was placed and the bladder was draining clear urine was noted.  Attention was returned to the abdomen.  Pneumoperitoneum was reintroduced.  The suprapubic trocar sleeve was replaced under direct visualization and copious irrigation was carried out.  Hemostasis was obtained at peritoneal edges with bipolar cautery.  Inspection revealed good hemostasis.  The remaining approximately 25 mL of 0.25% plain Marcaine was then instilled into peritoneal cavity.  Suprapubic trocar sleeve was removed. Pneumoperitoneum reduced.  The umbilical trocar sleeve was removed. Umbilical incision was closed with 0 Vicryl.  The subcutaneous layer under good visualization and the skin on both incisions was closed with interrupted 4-0 Vicryl.  Dermabond was placed on both.  All counts were correct.  The patient was awakened, taken to recovery room in stable condition.     Daleen Bo Gaetano Net,  M.D.     JET/MEDQ  D:  02/26/2015  T:  02/26/2015  Job:  [id number]

## 2015-03-08 HISTORY — PX: MASTECTOMY: SHX3

## 2015-05-27 ENCOUNTER — Other Ambulatory Visit: Payer: Self-pay

## 2015-05-27 DIAGNOSIS — Z1231 Encounter for screening mammogram for malignant neoplasm of breast: Secondary | ICD-10-CM

## 2015-06-08 ENCOUNTER — Ambulatory Visit
Admission: RE | Admit: 2015-06-08 | Discharge: 2015-06-08 | Disposition: A | Discharge status: Home / Self Care | Payer: BLUE CROSS/BLUE SHIELD | Source: Ambulatory Visit

## 2015-06-08 DIAGNOSIS — Z1231 Encounter for screening mammogram for malignant neoplasm of breast: Secondary | ICD-10-CM

## 2015-08-31 ENCOUNTER — Other Ambulatory Visit: Payer: Self-pay | Admitting: Obstetrics and Gynecology

## 2015-08-31 DIAGNOSIS — N63 Unspecified lump in unspecified breast: Secondary | ICD-10-CM

## 2015-09-01 ENCOUNTER — Other Ambulatory Visit: Payer: Self-pay | Admitting: Nurse Practitioner

## 2015-09-01 DIAGNOSIS — Z803 Family history of malignant neoplasm of breast: Secondary | ICD-10-CM

## 2015-09-03 ENCOUNTER — Other Ambulatory Visit: Payer: BLUE CROSS/BLUE SHIELD

## 2015-09-03 ENCOUNTER — Ambulatory Visit
Admission: RE | Admit: 2015-09-03 | Discharge: 2015-09-03 | Disposition: A | Discharge status: Home / Self Care | Payer: BLUE CROSS/BLUE SHIELD | Source: Ambulatory Visit | Attending: Obstetrics and Gynecology | Admitting: Obstetrics and Gynecology

## 2015-09-03 DIAGNOSIS — N63 Unspecified lump in unspecified breast: Secondary | ICD-10-CM

## 2015-09-04 ENCOUNTER — Telehealth: Payer: Self-pay | Admitting: *Deleted

## 2015-09-04 ENCOUNTER — Encounter: Payer: Self-pay | Admitting: *Deleted

## 2015-09-04 NOTE — Progress Notes (Signed)
[place] Work  Clinical Social Work was referred by genetics for assessment of psychosocial needs due to possible surgery and financial concerns.  Clinical Social Worker attempted to contact patient at home to offer support and assess for needs.  CSW left message in attempt to assist with resources.   Loren Racer, [place] Worker [place]  [place] Phone: [phone] Fax: [phone]

## 2015-09-04 NOTE — Telephone Encounter (Signed)
Spoke with patient to let her know to get her PCP to send a referral for high risk appointment.  She states her sister has seen Dr. Jana Hakim and she would prefer him.  Informed her to make sure her PCP writes Dr. Jana Hakim on there referral.  Encouraged her to call with any questions or concerns.

## 2015-09-09 ENCOUNTER — Encounter: Payer: Self-pay | Admitting: Oncology

## 2015-09-09 ENCOUNTER — Telehealth: Payer: Self-pay | Admitting: Oncology

## 2015-09-09 NOTE — Telephone Encounter (Signed)
Appointment scheduled with Dr. Jana Hakim on 7/13 at 4:30. Aware to arrive 30 minutes early. Demographics and insurance verified. Patient is worried, I  assured her that we would take very good care of her. Letter to referring.

## 2015-09-10 ENCOUNTER — Telehealth: Payer: Self-pay | Admitting: *Deleted

## 2015-09-10 NOTE — Telephone Encounter (Signed)
Received appt date/time from Andrea.  Placed a note for an intake form to be given to the pt at time of check in. 

## 2015-09-11 ENCOUNTER — Ambulatory Visit
Admission: RE | Admit: 2015-09-11 | Discharge: 2015-09-11 | Disposition: A | Discharge status: Home / Self Care | Payer: BLUE CROSS/BLUE SHIELD | Source: Ambulatory Visit | Attending: Nurse Practitioner | Admitting: Nurse Practitioner

## 2015-09-11 DIAGNOSIS — Z803 Family history of malignant neoplasm of breast: Secondary | ICD-10-CM

## 2015-09-11 MED ORDER — GADOBENATE DIMEGLUMINE 529 MG/ML IV SOLN: 18.0000 mL | Freq: Once | INTRAVENOUS | Status: DC | PRN

## 2015-09-15 ENCOUNTER — Other Ambulatory Visit: Payer: Self-pay | Admitting: Obstetrics and Gynecology

## 2015-09-15 DIAGNOSIS — N63 Unspecified lump in unspecified breast: Secondary | ICD-10-CM

## 2015-09-16 ENCOUNTER — Other Ambulatory Visit: Payer: Self-pay | Admitting: Obstetrics and Gynecology

## 2015-09-16 ENCOUNTER — Ambulatory Visit
Admission: RE | Admit: 2015-09-16 | Discharge: 2015-09-16 | Disposition: A | Discharge status: Home / Self Care | Payer: BLUE CROSS/BLUE SHIELD | Source: Ambulatory Visit | Attending: Obstetrics and Gynecology | Admitting: Obstetrics and Gynecology

## 2015-09-16 ENCOUNTER — Telehealth: Payer: Self-pay | Admitting: *Deleted

## 2015-09-16 ENCOUNTER — Telehealth: Payer: Self-pay | Admitting: Genetic Counselor

## 2015-09-16 DIAGNOSIS — N63 Unspecified lump in unspecified breast: Secondary | ICD-10-CM

## 2015-09-16 NOTE — Telephone Encounter (Signed)
Called patient back.  Patient was unsure if she should keep appointment if she knew that she wanted to have a dbl mast.  Patient had a breast bx today and will get results.  I encouraged her to keep the appoitment so that she can discuss the bx results, whether positive or negative, and put that in conjunction with the PALB2+ test results.  She stated that she will plan on keeping that appointment.

## 2015-09-16 NOTE — Telephone Encounter (Signed)
Pt called to this RN to inquire about appointment scheduled for tomorrow " and do I need to keep it since I have decided to go ahead with the prophylactic surgery ?" - " why is this appointment needed ?"  Per discussion including pt has been identified as high risk for developing breast cancer in her lifetime. Appointment is to discuss her situation including options for prevention as well as impact on her and her children.  Alejandra Harmon then stated " well I also am concerned about the charges and if I can afford the co-pay - ".  With further discussion including Alejandra Harmon stating she was called back to [facility] a biopsy today - she will contact this office tomorrow with update on her plan to come.  This note will be forwarded to MD and nurse navigator involved in scheduling this pt for possible resources regarding assistance with co pay / charges.  Best number given to reach pt is [phone].

## 2015-09-17 ENCOUNTER — Ambulatory Visit (HOSPITAL_BASED_OUTPATIENT_CLINIC_OR_DEPARTMENT_OTHER): Payer: BLUE CROSS/BLUE SHIELD | Admitting: Oncology

## 2015-09-17 ENCOUNTER — Telehealth: Payer: Self-pay | Admitting: *Deleted

## 2015-09-17 ENCOUNTER — Other Ambulatory Visit: Payer: BLUE CROSS/BLUE SHIELD

## 2015-09-17 DIAGNOSIS — Z1501 Genetic susceptibility to malignant neoplasm of breast: Secondary | ICD-10-CM

## 2015-09-17 DIAGNOSIS — Z1589 Genetic susceptibility to other disease: Principal | ICD-10-CM

## 2015-09-17 DIAGNOSIS — Z1509 Genetic susceptibility to other malignant neoplasm: Principal | ICD-10-CM

## 2015-09-17 NOTE — Telephone Encounter (Signed)
Pt left message at 230 p stating she is requesting to reschedule appointment for high risk assessment and discussion.  Return call number given as [phone].  MD informed and this note sent to KM/RN navigator per need to reschedule

## 2015-09-17 NOTE — Progress Notes (Signed)
[facility]  Telephone:([phone]) [phone] [phone]   NOTE 09/17/2015 PATIENT CALLED AND CANCELLED VISIT, CONSIDERING RESCHEDULING  ID: Alejandra Harmon DOB: 1960/05/19  MR#: [id number]  NMM#:[id number]  Patient Care Team: Alejandra Hedge, MD as PCP - General (Obstetrics and Gynecology) PCP: Alejandra Andrea, MD Alejandra Dell, MD GYN: SU:  OTHER MD:  CHIEF COMPLAINT:   CURRENT TREATMENT:    BREAST CANCER HISTORY:  INTERVAL HISTORY: Her case was also presented in the multidisciplinary breast cancer conference that same morning. At that time a preliminary plan was proposed:  REVIEW OF SYSTEMS:  PAST MEDICAL HISTORY: Past Medical History  Diagnosis Date  . Right thyroid nodule   . Pelvic pain in female   . Sciatica of left side   . Cyst of ovary   . History of esophageal reflux   . Allergic rhinitis   . Mild sleep apnea     STUDY 07-27-2009-- POSITIONAL / REM AFFECT---  RECOMMENDATION LOOSE WT.  Marland Kitchen Wears contact lenses     PAST SURGICAL HISTORY: Past Surgical History  Procedure Laterality Date  . Excision benign breast cyst  1980  . Cesarean section  1994  . Laparoscopic cholecystectomy  1998  . Elbow ulnar nerve decompression Right 05-13-2003  . Laparoscopy/  lysis adhesions/  right ovary cystectomy/  albation endometriosis/  chromopertubation  02-18-2004  . Breast enhancement surgery Bilateral 12-16-2008  . Colonoscopy  last one 12-23-2013  . Transthoracic echocardiogram  07-07-2009    grade 1 diastolic dysfunction, ef 50-55%/  mild AR and TR/  trivial MR  . Laparoscopic assisted vaginal hysterectomy N/A 02/26/2015    Procedure: LAPAROSCOPIC ASSISTED VAGINAL HYSTERECTOMY ;  Surgeon: Alejandra Hedge, MD;  Location: [facility];  Service: Gynecology;  Laterality: N/A;  . Laparoscopic bilateral salpingo oopherectomy Bilateral 02/26/2015    Procedure: LAPAROSCOPIC BILATERAL SALPINGO OOPHORECTOMY;  Surgeon: Alejandra Hedge, MD;   Location: [facility];  Service: Gynecology;  Laterality: Bilateral;    FAMILY HISTORY Family History  Problem Relation Age of Onset  . Diabetes Other   . Hypertension Other   . Cancer Other     ovarian cancer  . Diabetes Mother   . Hypertension Mother   . Breast cancer Mother 73  . Renal cancer Mother 74  . Breast cancer Sister 36  . Diabetes Brother   . Leukemia Brother   . Diabetes Brother   . Prostate cancer Brother 36  . Diabetes Brother   . Prostate cancer Brother 54  . Diabetes Brother   . Diabetes Brother   . Diabetes Brother   . Diabetes Sister   . Diabetes Sister   . Diabetes Sister   . Breast cancer Sister 75    PALB2+  . Bone cancer Maternal Aunt   . Prostate cancer Maternal Uncle   . Prostate cancer Paternal Uncle   . Breast cancer Maternal Aunt 45  . Colon cancer Neg Hx     GYNECOLOGIC HISTORY:  No LMP recorded.   SOCIAL HISTORY:      ADVANCED DIRECTIVES:    HEALTH MAINTENANCE: Social History  Substance Use Topics  . Smoking status: Never Smoker   . Smokeless tobacco: Never Used  . Alcohol Use: No     Colonoscopy:  PAP:  Bone density:   Allergies  Allergen Reactions  . Apple Anaphylaxis    Raw apples, can eat baked  . Bactrim [Sulfamethoxazole-Trimethoprim] Hives    Current Outpatient Prescriptions  Medication Sig Dispense Refill  .  ibuprofen (ADVIL,MOTRIN) 600 MG tablet Take 1 tablet (600 mg total) by mouth every 6 (six) hours as needed (mild pain). 30 tablet 0  . oxyCODONE-acetaminophen (PERCOCET/ROXICET) 5-325 MG tablet Take 1-2 tablets by mouth every 6 (six) hours as needed (moderate to severe pain (when tolerating fluids)). 30 tablet 0   No current facility-administered medications for this visit.    OBJECTIVE: There were no vitals filed for this visit.   There is no weight on file to calculate BMI.      Ocular: Sclerae unicteric, pupils equal, round and reactive to light Ear-nose-throat: Oropharynx clear  and moist Lymphatic: No cervical or supraclavicular adenopathy Lungs no rales or rhonchi, good excursion bilaterally Heart regular rate and rhythm, no murmur appreciated Abd soft, nontender, positive bowel sounds MSK no focal spinal tenderness, no joint edema Neuro: non-focal, well-oriented, appropriate affect Breasts:    LAB RESULTS:  CMP     Component Value Date/Time   NA 138 06/19/2014 1541   K 3.9 06/19/2014 1541   CL 104 06/19/2014 1541   CO2 28 06/19/2014 1541   GLUCOSE 96 06/19/2014 1541   BUN 17 06/19/2014 1541   CREATININE 0.76 06/19/2014 1541   CALCIUM 9.6 06/19/2014 1541   PROT 7.3 06/19/2014 1541   ALBUMIN 4.4 06/19/2014 1541   AST 12 06/19/2014 1541   ALT 14 06/19/2014 1541   ALKPHOS 89 06/19/2014 1541   BILITOT 0.6 06/19/2014 1541   GFRNONAA >90 09/25/2013 1117   GFRAA >90 09/25/2013 1117    INo results found for: SPEP, UPEP  Lab Results  Component Value Date   WBC 6.8 02/27/2015   NEUTROABS 4.6 06/19/2014   HGB 10.8* 02/27/2015   HCT 32.9* 02/27/2015   MCV 84.6 02/27/2015   PLT 223 02/27/2015      Chemistry      Component Value Date/Time   NA 138 06/19/2014 1541   K 3.9 06/19/2014 1541   CL 104 06/19/2014 1541   CO2 28 06/19/2014 1541   BUN 17 06/19/2014 1541   CREATININE 0.76 06/19/2014 1541      Component Value Date/Time   CALCIUM 9.6 06/19/2014 1541   ALKPHOS 89 06/19/2014 1541   AST 12 06/19/2014 1541   ALT 14 06/19/2014 1541   BILITOT 0.6 06/19/2014 1541       No results found for: LABCA2  No components found for: LABCA125  No results for input(s): INR in the last 168 hours.  Urinalysis    Component Value Date/Time   COLORURINE YELLOW 06/19/2014 1541   APPEARANCEUR CLEAR 06/19/2014 1541   LABSPEC 1.015 06/19/2014 1541   PHURINE 7.0 06/19/2014 1541   GLUCOSEU NEGATIVE 06/19/2014 1541   GLUCOSEU NEGATIVE 07/07/2009 2203   HGBUR NEGATIVE 06/19/2014 [place] 06/19/2014 1541   KETONESUR NEGATIVE  06/19/2014 1541   PROTEINUR NEGATIVE 07/07/2009 2203   UROBILINOGEN 0.2 06/19/2014 1541   NITRITE POSITIVE* 06/19/2014 1541   LEUKOCYTESUR NEGATIVE 06/19/2014 1541     STUDIES: Mr Breast Bilateral W Wo Contrast  09/16/2015  ADDENDUM REPORT: 09/16/2015 10:25 ADDENDUM: In the Findings section of the report, the Right Breast findings should read as follows: Approximate 8 x 7 x 7 mm enhancing mass in the inner breast at middle depth at the approximate 3 O'CLOCK position demonstrating plateau and washout kinetics. No abnormal enhancement elsewhere. Electronically Signed   By: Evangeline Dakin M.D.   On: 09/16/2015 10:25  09/16/2015  CLINICAL DATA:  55 year old with prior bilateral breast reductions, recent benign diagnostic  workup for palpable lump in the upper outer right breast (fat necrosis) and a palpable lump in the upper outer left breast (oil cysts), presenting for high risk MRI screening as she is BRCA positive and has its a strong family history of breast cancer in her mother and multiple sisters. LABS:  Not applicable. EXAM: BILATERAL BREAST MRI WITH AND WITHOUT CONTRAST TECHNIQUE: Multiplanar, multisequence MR images of both breasts were obtained prior to and following the intravenous administration of 18 ml of MultiHance. THREE-DIMENSIONAL MR IMAGE RENDERING ON INDEPENDENT WORKSTATION: Three-dimensional MR images were rendered by post-processing of the original MR data on an independent workstation. The three-dimensional MR images were interpreted, and findings are reported in the following complete MRI report for this study. Three dimensional images were evaluated at the independent DynaCad workstation. COMPARISON:  Mammography 09/03/2015, 06/08/2015 and earlier. Bilateral breast ultrasound 09/03/2015, right breast ultrasound 02/24/2015, left breast ultrasound 05/17/2013. No prior MRI. FINDINGS: Breast composition: b. Scattered fibroglandular tissue. Background parenchymal enhancement: Moderate.  Right breast: Approximate 8 x 7 x 7 mm enhancing mass in the inner breast at middle depth at the approximate 9 o'clock position demonstrating plateau and washout kinetics. No abnormal enhancement elsewhere. Oval-shaped area of fat necrosis involving the upper outer quadrant at posterior depth at the site of the recent palpable concern, corresponding to the recent sonographic findings. Left breast: No abnormal enhancement to suggest malignancy. Oil cyst in the upper outer quadrant at posterior depth at the site of the recent palpable concern, corresponding to the recent sonographic findings. Lymph nodes: No abnormal appearing lymph nodes. Ancillary findings:  None. IMPRESSION: 1. Indeterminate enhancing 8 mm mass in the inner right breast, middle depth, at the 3 o'clock position. There is no definite correlate on the recent mammogram with tomosynthesis. 2. No MRI evidence of malignancy involving the left breast. 3. Fat necrosis involving the upper outer quadrants of both breasts as noted on the recent diagnostic workup. RECOMMENDATION: 2nd-look ultrasound in an attempt to locate the 8 mm mass in the inner right breast. BI-RADS CATEGORY  4: Suspicious. Electronically Signed: By: Evangeline Dakin M.D. On: 09/11/2015 15:10   Mm Digital Diagnostic Unilat R  09/16/2015  CLINICAL DATA:  BRCA positive. Suspicious mass seen on screening MRI. Correlate seen with ultrasound. Biopsy recommended. EXAM: DIAGNOSTIC RIGHT MAMMOGRAM POST ULTRASOUND BIOPSY COMPARISON:  Previous exam(s). FINDINGS: Mammographic images were obtained following ultrasound guided biopsy of a mass in the 3 o'clock region the right breast. Mammographic images show there is a ribbon shaped clip in the 3 o'clock region of the right breast. IMPRESSION: Status post ultrasound-guided core biopsy of a right breast mass with pathology pending. Final Assessment: Post Procedure Mammograms for Marker Placement Electronically Signed   By: Lillia Mountain M.D.   On:  09/16/2015 15:27   US Breast Ltd Uni Left Inc Axilla  09/03/2015  CLINICAL DATA:  Patient presents for evaluation of palpable abnormality within the right axilla, upper outer right breast and lateral left breast. Patient reports multiple sisters and mother with breast cancer. EXAM: 2D DIGITAL DIAGNOSTIC BILATERAL MAMMOGRAM WITH CAD AND ADJUNCT TOMO ULTRASOUND BILATERAL BREAST COMPARISON:  Previous exam(s). ACR Breast Density Category c: The breast tissue is heterogeneously dense, which may obscure small masses. FINDINGS: Bilateral breast reduction changes are demonstrated. Within the upper-outer right breast there is stable coarse calcification, dystrophic in etiology. Within the upper-outer left breast there is a large oil cyst. No additional concerning masses, calcifications or nonsurgical architectural distortion identified within either breast. Mammographic images  were processed with CAD. On physical exam, I palpate soft tissue within the right axilla. Tissue thickening is palpated within the upper-outer right breast. Small mobile mass is palpated within the upper-outer left breast. Targeted ultrasound is performed, showing a peripherally echogenic oval shadowing mass within the upper-outer right breast 10 o'clock position 8 cm from the nipple, corresponding with palpable abnormality. This represents an area of fat necrosis as demonstrated on mammography. Within the left breast 2 o'clock position 5 cm from the nipple there is a 1.9 x 1.7 x 1.8 cm oil cyst, corresponding with palpable abnormality. Normal fatty tissue is demonstrated within the right axilla corresponding with palpable abnormality. IMPRESSION: Fat necrosis within the upper-outer right breast and oil cyst within the upper-outer left breast correspond with bilateral palpable abnormalities. Normal fatty tissue corresponds with palpable abnormality within the right axilla. No mammographic evidence for malignancy. RECOMMENDATION: Screening mammogram  in one year.(Code:SM-B-01Y). Consider annual high risk screening MRI given patient's family history of breast carcinoma. I have discussed the findings and recommendations with the patient. Results were also provided in writing at the conclusion of the visit. If applicable, a reminder letter will be sent to the patient regarding the next appointment. BI-RADS CATEGORY  2: Benign. Electronically Signed   By: Lovey Newcomer M.D.   On: 09/03/2015 12:38   US Breast Ltd Uni Right Inc Axilla  09/16/2015  CLINICAL DATA:  54 year old BRCA positive female presenting for second-look ultrasound of a right breast mass identified on MRI. EXAM: ULTRASOUND OF THE RIGHT BREAST COMPARISON:  Previous exam(s). FINDINGS: A subtle possible palpable masses identified in the medial aspect of the right breast at 3 o'clock. Targeted ultrasound is performed, showing a hypoechoic irregular mass with an echogenic rim at 3 o'clock, 3 cm from the nipple measuring 4 x 4 x 4 mm. Ultrasound of the right axilla demonstrates a borderline prominent lymph node with a cortex of 4 mm. IMPRESSION: 1. There is a highly suspicious 4 mm right breast mass at 3 o'clock. 2. Borderline prominent right axillary lymph node with a cortex measuring 4 mm. RECOMMENDATION: Ultrasound-guided biopsy is recommended for the right breast mass at 3 o'clock. This has been added on today's schedule at 2:30 p.m. I have discussed the findings and recommendations with the patient. Results were also provided in writing at the conclusion of the visit. If applicable, a reminder letter will be sent to the patient regarding the next appointment. BI-RADS CATEGORY  5: Highly suggestive of malignancy - appropriate action should be taken. Electronically Signed   By: Ammie Ferrier M.D.   On: 09/16/2015 10:17   US Breast Ltd Uni Right Inc Axilla  09/03/2015  CLINICAL DATA:  Patient presents for evaluation of palpable abnormality within the right axilla, upper outer right breast and  lateral left breast. Patient reports multiple sisters and mother with breast cancer. EXAM: 2D DIGITAL DIAGNOSTIC BILATERAL MAMMOGRAM WITH CAD AND ADJUNCT TOMO ULTRASOUND BILATERAL BREAST COMPARISON:  Previous exam(s). ACR Breast Density Category c: The breast tissue is heterogeneously dense, which may obscure small masses. FINDINGS: Bilateral breast reduction changes are demonstrated. Within the upper-outer right breast there is stable coarse calcification, dystrophic in etiology. Within the upper-outer left breast there is a large oil cyst. No additional concerning masses, calcifications or nonsurgical architectural distortion identified within either breast. Mammographic images were processed with CAD. On physical exam, I palpate soft tissue within the right axilla. Tissue thickening is palpated within the upper-outer right breast. Small mobile mass is palpated within  the upper-outer left breast. Targeted ultrasound is performed, showing a peripherally echogenic oval shadowing mass within the upper-outer right breast 10 o'clock position 8 cm from the nipple, corresponding with palpable abnormality. This represents an area of fat necrosis as demonstrated on mammography. Within the left breast 2 o'clock position 5 cm from the nipple there is a 1.9 x 1.7 x 1.8 cm oil cyst, corresponding with palpable abnormality. Normal fatty tissue is demonstrated within the right axilla corresponding with palpable abnormality. IMPRESSION: Fat necrosis within the upper-outer right breast and oil cyst within the upper-outer left breast correspond with bilateral palpable abnormalities. Normal fatty tissue corresponds with palpable abnormality within the right axilla. No mammographic evidence for malignancy. RECOMMENDATION: Screening mammogram in one year.(Code:SM-B-01Y). Consider annual high risk screening MRI given patient's family history of breast carcinoma. I have discussed the findings and recommendations with the patient.  Results were also provided in writing at the conclusion of the visit. If applicable, a reminder letter will be sent to the patient regarding the next appointment. BI-RADS CATEGORY  2: Benign. Electronically Signed   By: Lovey Newcomer M.D.   On: 09/03/2015 12:38   Mm Diag Breast Tomo Bilateral  09/03/2015  CLINICAL DATA:  Patient presents for evaluation of palpable abnormality within the right axilla, upper outer right breast and lateral left breast. Patient reports multiple sisters and mother with breast cancer. EXAM: 2D DIGITAL DIAGNOSTIC BILATERAL MAMMOGRAM WITH CAD AND ADJUNCT TOMO ULTRASOUND BILATERAL BREAST COMPARISON:  Previous exam(s). ACR Breast Density Category c: The breast tissue is heterogeneously dense, which may obscure small masses. FINDINGS: Bilateral breast reduction changes are demonstrated. Within the upper-outer right breast there is stable coarse calcification, dystrophic in etiology. Within the upper-outer left breast there is a large oil cyst. No additional concerning masses, calcifications or nonsurgical architectural distortion identified within either breast. Mammographic images were processed with CAD. On physical exam, I palpate soft tissue within the right axilla. Tissue thickening is palpated within the upper-outer right breast. Small mobile mass is palpated within the upper-outer left breast. Targeted ultrasound is performed, showing a peripherally echogenic oval shadowing mass within the upper-outer right breast 10 o'clock position 8 cm from the nipple, corresponding with palpable abnormality. This represents an area of fat necrosis as demonstrated on mammography. Within the left breast 2 o'clock position 5 cm from the nipple there is a 1.9 x 1.7 x 1.8 cm oil cyst, corresponding with palpable abnormality. Normal fatty tissue is demonstrated within the right axilla corresponding with palpable abnormality. IMPRESSION: Fat necrosis within the upper-outer right breast and oil cyst within  the upper-outer left breast correspond with bilateral palpable abnormalities. Normal fatty tissue corresponds with palpable abnormality within the right axilla. No mammographic evidence for malignancy. RECOMMENDATION: Screening mammogram in one year.(Code:SM-B-01Y). Consider annual high risk screening MRI given patient's family history of breast carcinoma. I have discussed the findings and recommendations with the patient. Results were also provided in writing at the conclusion of the visit. If applicable, a reminder letter will be sent to the patient regarding the next appointment. BI-RADS CATEGORY  2: Benign. Electronically Signed   By: Lovey Newcomer M.D.   On: 09/03/2015 12:38   [place] Rt Breast Bx W Loc Dev 1st Lesion Img Bx Spec US Guide  09/16/2015  CLINICAL DATA:  BRCA positive. Strong family history of breast cancer. Enhancing mass seen in the right breast on recent screening MRI. EXAM: ULTRASOUND GUIDED RIGHT BREAST CORE NEEDLE BIOPSY COMPARISON:  Previous exam(s). PROCEDURE: I met with  the patient and we discussed the procedure of ultrasound-guided biopsy, including benefits and alternatives. We discussed the high likelihood of a successful procedure. We discussed the risks of the procedure including infection, bleeding, tissue injury, clip migration, and inadequate sampling. Informed written consent was given. The usual time-out protocol was performed immediately prior to the procedure. Using sterile technique and 1% Lidocaine as local anesthetic, under direct ultrasound visualization, a 12 gauge vacuum-assisted device was used to perform biopsy of a mass in the 3 o'clock region of the right breastusing an inferior to superior approach. At the conclusion of the procedure, a ribbon shaped tissue marker clip was deployed into the biopsy cavity. Follow-up 2-view mammogram was performed and dictated separately. IMPRESSION: Ultrasound-guided biopsy of the right breast. No apparent complications. Electronically  Signed   By: Baird Lyons M.D.   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 found on her testing in 2015 is now classified as of no clinical significance.   PLAN: Partner and localizer of BRCA2 (PALB2) is a breast cancer susceptibility gene that encodes a BRCA2-interacting protein [53]. The BRCA2-PALB2 interaction is crucial for key BRCA2 DNA damage response functions as well as tumor suppression activity [54,55]. Biallelic mutations in the PALB2 gene, also known as FANCN, cause Fanconi anemia [56].  Although rare, monoallelic deleterious PALB2 mutations are present in a small but substantial proportion of patients with breast cancer [57,58], including about 1 percent of patients with triple-negative breast cancer [59]. An analysis of the risk of breast cancer in 362 members of 154 families with a deleterious PALB2 mutation estimated that the carrier frequency of PALB2 mutations is 0.08 percent, and pathogenic mutations account for approximately 2.4 percent of familial breast cancer [58]. In high-risk families, pathogenic PALB2 mutations were identified in 3.9 percent (13 of 409) of breast and/or ovarian cancer patients in the [place] who were negative for BRCA1 or BRCA2 mutations [60].  Women who carry a PALB2 mutation have a risk of breast cancer equivalent to the risk of women who carry a  BRCA2 mutation [61,62]. Breast cancer risk associated with a PALB2 mutation appears to be influenced by a family history of breast cancer and other as yet unidentified environmental and lifestyle factors [58]. The absolute lifetime risk to age 100 years for the development of female breast cancer in PALB2 mutation carriers is dependent upon family history of breast cancer as follows [58]: ?No family history of breast cancer - 33 percent (95% CI 25-44) ?Two or more family members with breast cancer - 58 percent (95% CI 50-66) In comparison with the general population, the relative risk of breast cancer for a woman with a PALB2 mutation based upon her age is [58]: ?Under age 26 - Eight to ninefold increase  ?40 to 60 years - Six to eightfold increase  ?Over 60 years - Fivefold increase Several studies have shown that PALB2 mutations also appear to be associated with an increased risk of  pancreatic cancer, although the absolute risk is unclear [63,64]. In addition, PALB2 mutations may also be associated with increased risks for breast cancer in men [65], prostate cancer [66], and ovarian cancer [10], although these risks are not confirmed and are also difficult to quantify      We spent the better part of today's hour-long appointment discussing the biology of breast cancer in general, and the specifics of the patient's tumor in particular.    The patient has a good understanding of the overall plan. She agrees with it. She knows the goal of treatment in her case is cure. She will call with any problems that may develop before her next visit here.  Chauncey Cruel, MD   09/17/2015 9:21 AM Medical Oncology and Hematology [facility] [address] [place], [place] [address] Tel. [phone]    Fax. [phone]

## 2015-09-18 ENCOUNTER — Other Ambulatory Visit: Payer: Self-pay | Admitting: Obstetrics and Gynecology

## 2015-09-18 DIAGNOSIS — R928 Other abnormal and inconclusive findings on diagnostic imaging of breast: Secondary | ICD-10-CM

## 2015-09-23 ENCOUNTER — Ambulatory Visit
Admission: RE | Admit: 2015-09-23 | Discharge: 2015-09-23 | Disposition: A | Discharge status: Home / Self Care | Payer: BLUE CROSS/BLUE SHIELD | Source: Ambulatory Visit | Attending: Obstetrics and Gynecology | Admitting: Obstetrics and Gynecology

## 2015-09-23 ENCOUNTER — Other Ambulatory Visit: Payer: Self-pay | Admitting: Obstetrics and Gynecology

## 2015-09-23 ENCOUNTER — Ambulatory Visit: Admission: RE | Admit: 2015-09-23 | Payer: BLUE CROSS/BLUE SHIELD | Source: Ambulatory Visit

## 2015-09-23 DIAGNOSIS — R928 Other abnormal and inconclusive findings on diagnostic imaging of breast: Secondary | ICD-10-CM

## 2015-09-25 ENCOUNTER — Telehealth: Payer: Self-pay | Admitting: *Deleted

## 2015-09-25 NOTE — Telephone Encounter (Signed)
"  I'm calling back as I have not heard anything from my call this morning at 0900.  I also understand you all can refer me to see a surgeon and this is what I want as well.  Please make sure they know I called and left a message to cancel my previous appointment.  I do care and did not just not show up.   No one called then either to acknowledge my message to cancel the appointment and this was upsetting."  Advised patient today's message was received per collaborative nurse.  Expect a call early next week with scheduling information.  Will notify staff of this request for a surgeon.  Return number is [phone].

## 2015-10-19 ENCOUNTER — Encounter: Payer: Self-pay | Admitting: Plastic Surgery

## 2015-10-22 ENCOUNTER — Other Ambulatory Visit: Payer: Self-pay | Admitting: General Surgery

## 2015-12-06 DIAGNOSIS — C801 Malignant (primary) neoplasm, unspecified: Secondary | ICD-10-CM

## 2015-12-06 HISTORY — DX: Malignant (primary) neoplasm, unspecified: C80.1

## 2015-12-22 NOTE — Pre-Procedure Instructions (Signed)
Alejandra Harmon  12/22/2015      [id number] [name], [name] - [address] [name] [address] Phone: [phone] Fax: [phone]  [place], [place] [address] Phone: [phone] Fax: [phone]    Your procedure is scheduled on Tues, Oct 31 @ 8:15 AM  Report to [facility] at [employer] AM  Call this number if you have problems the morning of surgery:  [phone]   Remember:  Do not eat food or drink liquids after midnight.  Take these medicines the morning of surgery with A SIP OF WATER Pain Pill(if needed)              Stop taking Ibuprofen a week prior to surgery. No Goody's,BC's,Aleve,Advil,Motrin,Fish Oil,or any Herbal Medications.    Do not wear jewelry, make-up or nail polish.  Do not wear lotions, powders,perfumes, or deoderant.  Do not shave 48 hours prior to surgery.    Do not bring valuables to the hospital.  [name] is not responsible for any belongings or valuables.  Contacts, dentures or bridgework may not be worn into surgery.  Leave your suitcase in the car.  After surgery it may be brought to your room.  For patients admitted to the hospital, discharge time will be determined by your treatment team.  Patients discharged the day of surgery will not be allowed to drive home.   [facility]  Before surgery, you can play an important role.  Because skin is not sterile, your skin needs to be as free of germs as possible.  You can reduce the number of germs on you skin by washing with CHG (chlorahexidine gluconate) soap before surgery.  CHG is an antiseptic cleaner which kills germs and bonds with the skin to continue killing germs even after washing.  Please DO NOT use if you have an allergy to CHG or antibacterial soaps.  If your skin becomes reddened/irritated stop using the CHG  and inform your nurse when you arrive at Short Stay.  Do not shave (including legs and underarms) for at least 48 hours prior to the first CHG shower.  You may shave your face.  Please follow these instructions carefully:   1.  Shower with CHG Soap the night before surgery and the                                morning of Surgery.  2.  If you choose to wash your hair, wash your hair first as usual with your       normal shampoo.  3.  After you shampoo, rinse your hair and body thoroughly to remove the                      Shampoo.  4.  Use CHG as you would any other liquid soap.  You can apply chg directly       to the skin and wash gently with scrungie or a clean washcloth.  5.  Apply the CHG Soap to your body ONLY FROM THE NECK DOWN.        Do not use on open wounds or open sores.  Avoid contact with your eyes,       ears, mouth and genitals (private parts).  Wash genitals (private  parts)       with your normal soap.  6.  Wash thoroughly, paying special attention to the area where your surgery        will be performed.  7.  Thoroughly rinse your body with warm water from the neck down.  8.  DO NOT shower/wash with your normal soap after using and rinsing off       the CHG Soap.  9.  Pat yourself dry with a clean towel.            10.  Wear clean pajamas.            11.  Place clean sheets on your bed the night of your first shower and do not        sleep with pets.  Day of Surgery  Do not apply any lotions/deoderants the morning of surgery.  Please wear clean clothes to the hospital/surgery center.   Please read over the following fact sheets that you were given. Pain Booklet, Coughing and Deep Breathing and Surgical Site Infection Prevention

## 2015-12-23 ENCOUNTER — Encounter (HOSPITAL_COMMUNITY): Payer: Self-pay

## 2015-12-23 ENCOUNTER — Encounter (HOSPITAL_COMMUNITY)
Admission: RE | Admit: 2015-12-23 | Discharge: 2015-12-23 | Disposition: A | Discharge status: Home / Self Care | Payer: BLUE CROSS/BLUE SHIELD | Source: Ambulatory Visit | Attending: General Surgery | Admitting: General Surgery

## 2015-12-23 DIAGNOSIS — Z01818 Encounter for other preprocedural examination: Secondary | ICD-10-CM | POA: Diagnosis not present

## 2015-12-23 LAB — CBC
HEMATOCRIT: 44.9 % (ref 36.0–46.0)
HEMOGLOBIN: 14.5 g/dL (ref 12.0–15.0)
MCH: 27.3 pg (ref 26.0–34.0)
MCHC: 32.3 g/dL (ref 30.0–36.0)
MCV: 84.6 fL (ref 78.0–100.0)
Platelets: 286 10*3/uL (ref 150–400)
RBC: 5.31 MIL/uL — AB (ref 3.87–5.11)
RDW: 14.1 % (ref 11.5–15.5)
WBC: 7.4 10*3/uL (ref 4.0–10.5)

## 2015-12-23 LAB — BASIC METABOLIC PANEL
ANION GAP: 6 (ref 5–15)
BUN: 6 mg/dL (ref 6–20)
CHLORIDE: 107 mmol/L (ref 101–111)
CO2: 28 mmol/L (ref 22–32)
Calcium: 9.6 mg/dL (ref 8.9–10.3)
Creatinine, Ser: 0.87 mg/dL (ref 0.44–1.00)
GFR calc Af Amer: >60 mL/min (ref >60)
Glucose, Bld: 109 mg/dL — ABNORMAL HIGH (ref 65–99)
POTASSIUM: 4.1 mmol/L (ref 3.5–5.1)
SODIUM: 141 mmol/L (ref 135–145)

## 2015-12-23 LAB — HCG, SERUM, QUALITATIVE: Preg, Serum: NEGATIVE

## 2015-12-23 NOTE — H&P (Signed)
Subjective:    Patient ID: Alejandra Harmon is a 55 y.o. female.  HPI  Here for follow up discussion prior to planned bilateral NSM with expander based reconstruction. Patient with known PALB 2 mutation, three sisters and mother with breast ca. I know one sister quite well as I performed her delayed reconstruction.  Patient with screening MMG 06/2015 normal. Had diagnostic MMG 08/2015 for palpable mass and fat necrosis on right and cyst on left noted. MRI demonstrated 8 x 7 x 7 mm enhancing mass at 3 o clock. She underwent subsequent second look [place] with 37m suspicious mass right and borderline prominent right axillary LN with a cortex measuring 4 mm. [place] biopsy with fat necrosis. She underwent repeat MRI to ensure mass biopsied and clip noted within mass.   History of breast reduction 2010 with [name] Current B cup, down from D. Patient owns own business. Wt stable  Review of Systems    Objective:   Physical Exam  Cardiovascular: Normal rate, regular rhythm and normal heart sounds.   Pulmonary/Chest: Effort normal and breath sounds normal.  Lymphadenopathy:    She has no axillary adenopathy.   Right axillary lipoma Well healed Wise pattern incisions, some hyperpigmentation and increased with lateral extent IMF, transverse scar lies few cm above actual IMF No masses pseudoptosis Sn to nipple R 25 L 26 cm BW R 18 L 18 (true BW chest 13 cm) Nipple to IMF R 11 L 10 cm    Assessment:     PALB2 mutation History breast reduction    Plan:      Plan bilateral NSM utilizing current scars. Plan placement TE submuscular. Discussed possible use acellular dermis. Reviewed use of ADM in breast reconstruction, cadaveric source Reviewed if infection or fluid collection occurs can act as nidus for infection as in not vascularized for several weeks.   Reviewed overnight stay, post op visits and limitations, drains. Additional risks include but are not limited to  failure/rupture implant, seroma, hematoma, damage to deeper structures, need for additional surgery, tissue necrosis nipple or mastectomy flaps, need to remove expander, DVT/PE, cardiopulmonary complications, infection requiring removal, unacceptable cosmetic appearance.  BIrene Limbo MD [facility] [phone] pin [phone]

## 2015-12-28 ENCOUNTER — Other Ambulatory Visit (HOSPITAL_COMMUNITY): Payer: BLUE CROSS/BLUE SHIELD

## 2016-01-04 NOTE — Progress Notes (Signed)
Left message for patient to arrive at 530 am 01/05/16.

## 2016-01-05 ENCOUNTER — Encounter (HOSPITAL_COMMUNITY): Payer: Self-pay | Admitting: *Deleted

## 2016-01-05 ENCOUNTER — Ambulatory Visit (HOSPITAL_COMMUNITY): Payer: BLUE CROSS/BLUE SHIELD | Admitting: Emergency Medicine

## 2016-01-05 ENCOUNTER — Encounter (HOSPITAL_COMMUNITY)
Admission: RE | Disposition: A | Discharge status: Home / Self Care | Payer: Self-pay | Source: Ambulatory Visit | Attending: General Surgery

## 2016-01-05 ENCOUNTER — Ambulatory Visit (HOSPITAL_COMMUNITY): Payer: BLUE CROSS/BLUE SHIELD | Admitting: Certified Registered Nurse Anesthetist

## 2016-01-05 ENCOUNTER — Observation Stay (HOSPITAL_COMMUNITY)
Admission: RE | Admit: 2016-01-05 | Discharge: 2016-01-06 | Disposition: A | Discharge status: Home / Self Care | Payer: BLUE CROSS/BLUE SHIELD | Source: Ambulatory Visit | Attending: General Surgery | Admitting: General Surgery

## 2016-01-05 DIAGNOSIS — Z90710 Acquired absence of both cervix and uterus: Secondary | ICD-10-CM | POA: Diagnosis not present

## 2016-01-05 DIAGNOSIS — Z1501 Genetic susceptibility to malignant neoplasm of breast: Secondary | ICD-10-CM | POA: Diagnosis not present

## 2016-01-05 DIAGNOSIS — Z8249 Family history of ischemic heart disease and other diseases of the circulatory system: Secondary | ICD-10-CM | POA: Insufficient documentation

## 2016-01-05 DIAGNOSIS — N6011 Diffuse cystic mastopathy of right breast: Secondary | ICD-10-CM | POA: Diagnosis not present

## 2016-01-05 DIAGNOSIS — Z6835 Body mass index (BMI) 35.0-35.9, adult: Secondary | ICD-10-CM | POA: Insufficient documentation

## 2016-01-05 DIAGNOSIS — Z4001 Encounter for prophylactic removal of breast: Principal | ICD-10-CM | POA: Insufficient documentation

## 2016-01-05 DIAGNOSIS — N6012 Diffuse cystic mastopathy of left breast: Secondary | ICD-10-CM | POA: Diagnosis not present

## 2016-01-05 DIAGNOSIS — K219 Gastro-esophageal reflux disease without esophagitis: Secondary | ICD-10-CM | POA: Insufficient documentation

## 2016-01-05 DIAGNOSIS — Z823 Family history of stroke: Secondary | ICD-10-CM | POA: Insufficient documentation

## 2016-01-05 DIAGNOSIS — Z882 Allergy status to sulfonamides status: Secondary | ICD-10-CM | POA: Insufficient documentation

## 2016-01-05 DIAGNOSIS — Z803 Family history of malignant neoplasm of breast: Secondary | ICD-10-CM | POA: Diagnosis not present

## 2016-01-05 DIAGNOSIS — Z809 Family history of malignant neoplasm, unspecified: Secondary | ICD-10-CM | POA: Diagnosis not present

## 2016-01-05 DIAGNOSIS — Z818 Family history of other mental and behavioral disorders: Secondary | ICD-10-CM | POA: Insufficient documentation

## 2016-01-05 DIAGNOSIS — Z9189 Other specified personal risk factors, not elsewhere classified: Secondary | ICD-10-CM

## 2016-01-05 HISTORY — PX: BREAST RECONSTRUCTION WITH PLACEMENT OF TISSUE EXPANDER AND FLEX HD (ACELLULAR HYDRATED DERMIS): SHX6295

## 2016-01-05 HISTORY — PX: NIPPLE SPARING MASTECTOMY: SHX6537

## 2016-01-05 SURGERY — MASTECTOMY, NIPPLE SPARING
Anesthesia: Regional | Site: Breast | Laterality: Bilateral

## 2016-01-05 MED ORDER — OXYCODONE HCL 5 MG PO TABS: 5.0000 mg | ORAL_TABLET | Freq: Once | ORAL | Status: DC | PRN

## 2016-01-05 MED ORDER — ACETAMINOPHEN 325 MG PO TABS: 650.0000 mg | ORAL_TABLET | Freq: Four times a day (QID) | ORAL | Status: DC | PRN

## 2016-01-05 MED ORDER — SODIUM CHLORIDE 0.9 % IV SOLN
Freq: Once | INTRAVENOUS | Status: AC
  Administered 2016-01-05: 1000 mL
  Filled 2016-01-05: qty 1

## 2016-01-05 MED ORDER — METHOCARBAMOL 500 MG PO TABS
500.0000 mg | ORAL_TABLET | Freq: Four times a day (QID) | ORAL | Status: DC | PRN
  Administered 2016-01-05: 500 mg via ORAL
  Filled 2016-01-05: qty 1

## 2016-01-05 MED ORDER — NAPROXEN 250 MG PO TABS
500.0000 mg | ORAL_TABLET | Freq: Two times a day (BID) | ORAL | Status: DC | PRN
  Administered 2016-01-05 (×2): 500 mg via ORAL
  Filled 2016-01-05 (×2): qty 2

## 2016-01-05 MED ORDER — PHENYLEPHRINE 40 MCG/ML (10ML) SYRINGE FOR IV PUSH (FOR BLOOD PRESSURE SUPPORT)
PREFILLED_SYRINGE | INTRAVENOUS | Status: AC
  Filled 2016-01-05: qty 10

## 2016-01-05 MED ORDER — FENTANYL CITRATE (PF) 100 MCG/2ML IJ SOLN
INTRAMUSCULAR | Status: AC
  Filled 2016-01-05: qty 2

## 2016-01-05 MED ORDER — ONDANSETRON 4 MG PO TBDP: 4.0000 mg | ORAL_TABLET | Freq: Four times a day (QID) | ORAL | Status: DC | PRN

## 2016-01-05 MED ORDER — SODIUM CHLORIDE 0.9 % IR SOLN
Status: DC | PRN
  Administered 2016-01-05: 1000 mL

## 2016-01-05 MED ORDER — OXYCODONE HCL 5 MG/5ML PO SOLN: 5.0000 mg | Freq: Once | ORAL | Status: DC | PRN

## 2016-01-05 MED ORDER — CEFAZOLIN SODIUM-DEXTROSE 2-4 GM/100ML-% IV SOLN
2.0000 g | INTRAVENOUS | Status: AC
  Administered 2016-01-05: 2 g via INTRAVENOUS
  Filled 2016-01-05: qty 100

## 2016-01-05 MED ORDER — MIDAZOLAM HCL 2 MG/2ML IJ SOLN
INTRAMUSCULAR | Status: AC
  Filled 2016-01-05: qty 2

## 2016-01-05 MED ORDER — ONDANSETRON HCL 4 MG/2ML IJ SOLN
INTRAMUSCULAR | Status: AC
  Filled 2016-01-05: qty 2

## 2016-01-05 MED ORDER — GABAPENTIN 300 MG PO CAPS
300.0000 mg | ORAL_CAPSULE | ORAL | Status: AC
  Administered 2016-01-05: 300 mg via ORAL
  Filled 2016-01-05: qty 1

## 2016-01-05 MED ORDER — ROCURONIUM BROMIDE 100 MG/10ML IV SOLN
INTRAVENOUS | Status: DC | PRN
  Administered 2016-01-05: 10 mg via INTRAVENOUS
  Administered 2016-01-05: 70 mg via INTRAVENOUS

## 2016-01-05 MED ORDER — FENTANYL CITRATE (PF) 100 MCG/2ML IJ SOLN
INTRAMUSCULAR | Status: AC
  Filled 2016-01-05: qty 4

## 2016-01-05 MED ORDER — ACETAMINOPHEN 650 MG RE SUPP: 650.0000 mg | Freq: Four times a day (QID) | RECTAL | Status: DC | PRN

## 2016-01-05 MED ORDER — MIDAZOLAM HCL 5 MG/5ML IJ SOLN
INTRAMUSCULAR | Status: DC | PRN
  Administered 2016-01-05: 2 mg via INTRAVENOUS

## 2016-01-05 MED ORDER — FENTANYL CITRATE (PF) 100 MCG/2ML IJ SOLN
INTRAMUSCULAR | Status: DC | PRN
  Administered 2016-01-05 (×5): 50 ug via INTRAVENOUS
  Administered 2016-01-05: 100 ug via INTRAVENOUS
  Administered 2016-01-05: 50 ug via INTRAVENOUS

## 2016-01-05 MED ORDER — PHENYLEPHRINE HCL 10 MG/ML IJ SOLN
INTRAVENOUS | Status: DC | PRN
  Administered 2016-01-05: 10 ug/min via INTRAVENOUS

## 2016-01-05 MED ORDER — MORPHINE SULFATE (PF) 2 MG/ML IV SOLN: 2.0000 mg | INTRAVENOUS | Status: DC | PRN

## 2016-01-05 MED ORDER — WHITE PETROLATUM GEL
Status: AC
  Administered 2016-01-05: 13:00:00
  Filled 2016-01-05: qty 1

## 2016-01-05 MED ORDER — SIMETHICONE 80 MG PO CHEW: 40.0000 mg | CHEWABLE_TABLET | Freq: Four times a day (QID) | ORAL | Status: DC | PRN

## 2016-01-05 MED ORDER — DEXAMETHASONE SODIUM PHOSPHATE 10 MG/ML IJ SOLN
INTRAMUSCULAR | Status: DC | PRN
  Administered 2016-01-05: 10 mg via INTRAVENOUS

## 2016-01-05 MED ORDER — HYDROMORPHONE HCL 1 MG/ML IJ SOLN
INTRAMUSCULAR | Status: AC
  Filled 2016-01-05: qty 1

## 2016-01-05 MED ORDER — SODIUM CHLORIDE 0.9 % IV SOLN
INTRAVENOUS | Status: DC
  Administered 2016-01-05: 14:00:00 via INTRAVENOUS

## 2016-01-05 MED ORDER — HYDROMORPHONE HCL 1 MG/ML IJ SOLN: 0.2500 mg | INTRAMUSCULAR | Status: DC | PRN

## 2016-01-05 MED ORDER — CEFAZOLIN SODIUM-DEXTROSE 2-4 GM/100ML-% IV SOLN
2.0000 g | Freq: Three times a day (TID) | INTRAVENOUS | Status: DC
  Administered 2016-01-05 – 2016-01-06 (×3): 2 g via INTRAVENOUS
  Filled 2016-01-05 (×6): qty 100

## 2016-01-05 MED ORDER — PROPOFOL 10 MG/ML IV BOLUS
INTRAVENOUS | Status: AC
  Filled 2016-01-05: qty 20

## 2016-01-05 MED ORDER — SCOPOLAMINE 1 MG/3DAYS TD PT72
MEDICATED_PATCH | TRANSDERMAL | Status: DC | PRN
  Administered 2016-01-05: 1 via TRANSDERMAL

## 2016-01-05 MED ORDER — ONDANSETRON HCL 4 MG/2ML IJ SOLN: 4.0000 mg | Freq: Four times a day (QID) | INTRAMUSCULAR | Status: DC | PRN

## 2016-01-05 MED ORDER — ONDANSETRON HCL 4 MG/2ML IJ SOLN
INTRAMUSCULAR | Status: DC | PRN
  Administered 2016-01-05 (×2): 4 mg via INTRAVENOUS

## 2016-01-05 MED ORDER — DIPHENHYDRAMINE HCL 50 MG/ML IJ SOLN
INTRAMUSCULAR | Status: AC
  Filled 2016-01-05: qty 1

## 2016-01-05 MED ORDER — PROPOFOL 1000 MG/100ML IV EMUL
INTRAVENOUS | Status: AC
  Filled 2016-01-05: qty 100

## 2016-01-05 MED ORDER — 0.9 % SODIUM CHLORIDE (POUR BTL) OPTIME
TOPICAL | Status: DC | PRN
  Administered 2016-01-05 (×2): 1000 mL

## 2016-01-05 MED ORDER — LACTATED RINGERS IV SOLN
INTRAVENOUS | Status: DC | PRN
  Administered 2016-01-05 (×3): via INTRAVENOUS

## 2016-01-05 MED ORDER — SCOPOLAMINE 1 MG/3DAYS TD PT72
MEDICATED_PATCH | TRANSDERMAL | Status: AC
  Filled 2016-01-05: qty 1

## 2016-01-05 MED ORDER — HYDROMORPHONE HCL 1 MG/ML IJ SOLN
INTRAMUSCULAR | Status: DC | PRN
  Administered 2016-01-05 (×2): .2 mg via INTRAVENOUS

## 2016-01-05 MED ORDER — ACETAMINOPHEN 500 MG PO TABS
1000.0000 mg | ORAL_TABLET | ORAL | Status: AC
  Administered 2016-01-05: 1000 mg via ORAL
  Filled 2016-01-05: qty 2

## 2016-01-05 MED ORDER — SUGAMMADEX SODIUM 200 MG/2ML IV SOLN
INTRAVENOUS | Status: DC | PRN
  Administered 2016-01-05: 200 mg via INTRAVENOUS

## 2016-01-05 MED ORDER — OXYCODONE HCL 5 MG PO TABS
5.0000 mg | ORAL_TABLET | ORAL | Status: DC | PRN
  Administered 2016-01-05 – 2016-01-06 (×3): 10 mg via ORAL
  Filled 2016-01-05 (×3): qty 2

## 2016-01-05 MED ORDER — PROPOFOL 10 MG/ML IV BOLUS
INTRAVENOUS | Status: DC | PRN
  Administered 2016-01-05: 200 mg via INTRAVENOUS

## 2016-01-05 MED ORDER — DIPHENHYDRAMINE HCL 50 MG/ML IJ SOLN
INTRAMUSCULAR | Status: DC | PRN
  Administered 2016-01-05: 25 mg via INTRAVENOUS

## 2016-01-05 SURGICAL SUPPLY — 72 items
ADH SKN CLS APL DERMABOND .7 (GAUZE/BANDAGES/DRESSINGS) ×2
ALLOGRAFT DERM CORTIV 4X12 (Tissue Mesh) ×2 IMPLANT
BAG DECANTER FOR FLEXI CONT (MISCELLANEOUS) ×2 IMPLANT
BINDER BREAST LRG (GAUZE/BANDAGES/DRESSINGS) IMPLANT
BINDER BREAST XLRG (GAUZE/BANDAGES/DRESSINGS) ×2 IMPLANT
CANISTER SUCTION 2500CC (MISCELLANEOUS) ×2 IMPLANT
CHLORAPREP W/TINT 26ML (MISCELLANEOUS) ×3 IMPLANT
CLSR STERI-STRIP ANTIMIC 1/2X4 (GAUZE/BANDAGES/DRESSINGS) ×1 IMPLANT
CONT SPEC 4OZ CLIKSEAL STRL BL (MISCELLANEOUS) ×3 IMPLANT
COVER SURGICAL LIGHT HANDLE (MISCELLANEOUS) ×2 IMPLANT
DERMABOND ADVANCED (GAUZE/BANDAGES/DRESSINGS) ×2
DERMABOND ADVANCED .7 DNX12 (GAUZE/BANDAGES/DRESSINGS) ×2 IMPLANT
DRAIN CHANNEL 15F RND FF W/TCR (WOUND CARE) IMPLANT
DRAIN CHANNEL 19F RND (DRAIN) ×3 IMPLANT
DRAPE INCISE IOBAN 66X45 STRL (DRAPES) IMPLANT
DRAPE ORTHO SPLIT 77X108 STRL (DRAPES) ×4
DRAPE PROXIMA HALF (DRAPES) ×2 IMPLANT
DRAPE SURG ORHT 6 SPLT 77X108 (DRAPES) ×2 IMPLANT
DRAPE WARM FLUID 44X44 (DRAPE) ×2 IMPLANT
DRSG PAD ABDOMINAL 8X10 ST (GAUZE/BANDAGES/DRESSINGS) ×4 IMPLANT
DRSG TEGADERM 2-3/8X2-3/4 SM (GAUZE/BANDAGES/DRESSINGS) ×1 IMPLANT
DRSG TEGADERM 4X4.75 (GAUZE/BANDAGES/DRESSINGS) ×10 IMPLANT
ELECT BLADE 4.0 EZ CLEAN MEGAD (MISCELLANEOUS) ×2
ELECT CAUTERY BLADE 6.4 (BLADE) ×2 IMPLANT
ELECT COATED BLADE 2.86 ST (ELECTRODE) ×2 IMPLANT
ELECT REM PT RETURN 9FT ADLT (ELECTROSURGICAL) ×2
ELECTRODE BLDE 4.0 EZ CLN MEGD (MISCELLANEOUS) ×1 IMPLANT
ELECTRODE REM PT RTRN 9FT ADLT (ELECTROSURGICAL) ×1 IMPLANT
EVACUATOR SILICONE 100CC (DRAIN) ×2 IMPLANT
GAUZE SPONGE 4X4 12PLY STRL (GAUZE/BANDAGES/DRESSINGS) IMPLANT
GLOVE BIO SURGEON STRL SZ 6 (GLOVE) ×6 IMPLANT
GLOVE BIO SURGEON STRL SZ7 (GLOVE) ×4 IMPLANT
GLOVE BIOGEL PI IND STRL 7.0 (GLOVE) IMPLANT
GLOVE BIOGEL PI IND STRL 7.5 (GLOVE) IMPLANT
GLOVE BIOGEL PI INDICATOR 7.0 (GLOVE) ×4
GLOVE BIOGEL PI INDICATOR 7.5 (GLOVE) ×2
GLOVE ECLIPSE 7.5 STRL STRAW (GLOVE) IMPLANT
GOWN STRL REUS W/ TWL LRG LVL3 (GOWN DISPOSABLE) ×4 IMPLANT
GOWN STRL REUS W/ TWL XL LVL3 (GOWN DISPOSABLE) ×1 IMPLANT
GOWN STRL REUS W/TWL LRG LVL3 (GOWN DISPOSABLE) ×8
GOWN STRL REUS W/TWL XL LVL3 (GOWN DISPOSABLE) ×2
KIT BASIN OR (CUSTOM PROCEDURE TRAY) ×2 IMPLANT
KIT ROOM TURNOVER OR (KITS) ×2 IMPLANT
MARKER SKIN DUAL TIP RULER LAB (MISCELLANEOUS) ×2 IMPLANT
NS IRRIG 1000ML POUR BTL (IV SOLUTION) ×5 IMPLANT
PACK GENERAL/GYN (CUSTOM PROCEDURE TRAY) ×2 IMPLANT
PAD ARMBOARD 7.5X6 YLW CONV (MISCELLANEOUS) ×2 IMPLANT
PIN SAFETY STERILE (MISCELLANEOUS) ×2 IMPLANT
SET ASEPTIC TRANSFER (MISCELLANEOUS) ×2 IMPLANT
SOLUTION BETADINE 4OZ (MISCELLANEOUS) ×2 IMPLANT
SPECIMEN JAR X LARGE (MISCELLANEOUS) ×2 IMPLANT
SPONGE LAP 18X18 X RAY DECT (DISPOSABLE) ×1 IMPLANT
STAPLER VISISTAT 35W (STAPLE) ×1 IMPLANT
SUT ETHILON 2 0 FS 18 (SUTURE) ×4 IMPLANT
SUT MNCRL 3 0 RB1 (SUTURE) IMPLANT
SUT MNCRL AB 4-0 PS2 18 (SUTURE) ×2 IMPLANT
SUT MON AB 5-0 PS2 18 (SUTURE) IMPLANT
SUT MONOCRYL 3 0 RB1 (SUTURE)
SUT VIC AB 0 CT2 27 (SUTURE) ×2 IMPLANT
SUT VIC AB 3-0 SH 18 (SUTURE) ×2 IMPLANT
SUT VIC AB 3-0 SH 27 (SUTURE) ×10
SUT VIC AB 3-0 SH 27X BRD (SUTURE) ×5 IMPLANT
SUT VIC AB 4-0 PS2 27 (SUTURE) ×1 IMPLANT
SUT VICRYL 3 0 (SUTURE) IMPLANT
SUT VICRYL 4-0 PS2 18IN ABS (SUTURE) ×1 IMPLANT
SYR BULB IRRIGATION 50ML (SYRINGE) ×2 IMPLANT
TAPE STRIPS DRAPE STRL (GAUZE/BANDAGES/DRESSINGS) ×2 IMPLANT
TISSUE EXPANDER FX 550CC (Prosthesis & Implant Plastic) ×2 IMPLANT
TOWEL OR 17X24 6PK STRL BLUE (TOWEL DISPOSABLE) IMPLANT
TOWEL OR 17X26 10 PK STRL BLUE (TOWEL DISPOSABLE) ×2 IMPLANT
TRAY FOLEY CATH 16FRSI W/METER (SET/KITS/TRAYS/PACK) ×2 IMPLANT
TUBE CONNECTING 12X1/4 (SUCTIONS) ×2 IMPLANT

## 2016-01-05 NOTE — Op Note (Signed)
Operative Note   DATE OF OPERATION: 10.31.17  LOCATION: [facility]  SURGICAL DIVISION: Plastic Surgery  PREOPERATIVE DIAGNOSES:  1. 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 Family history breast cancer  POSTOPERATIVE DIAGNOSES:  same  PROCEDURE:  1. Bilateral breast reconstruction with tissue expanders 2. Acellular dermis for breast reconstruction (Cortiva) 80 cm2  SURGEON: Glenna Fellows MD MBA  ASSISTANT: none  ANESTHESIA:  General.   EBL: 40 ml for entire case  COMPLICATIONS: None immediate.   INDICATIONS FOR PROCEDURE:  The patient, Alejandra Harmon, is a 55 y.o. female born on 09/25/1960, is here for immediate expander based reconstruction following bilateral prophylactic nipple sparing mastectomies. She has a history of prior breast reduction.   FINDINGS: Left mastectomy 843 g Right 835 g. Natrelle 133 FX-13-T 550 ml tissue expanders placed bilateral, initial fill volume 300 ml bilateral. RIGHT [id number] LEFT SN [id number]  DESCRIPTION OF PROCEDURE:  The patient was marked with the patient in the preoperative area to mark sternal notch, chest midline, anterior axillary lines and inframammary folds. The patient was taken to the operating room. SCDs were placed and IV antibiotics were given. The patient's operative site was prepped and draped in a sterile fashion. A time out was performed and all information was confirmed to be correct. Following completion of mastectomies, reconstruction began on left side. The inferior insertions of pectoralis major muscle were elevated continuous with abdominal wall fascia. Submuscular dissection completed toward clavicle. The anterior rectus fascia was elevated 1-2 cm below inframammary fold.Acellular dermis was perforated and sewn to inferior border of pectoralis major with running 3-0 vicryl. A 19 Fr drain was placed and secured to skin with 2-0 nylon. The cavity was irrigated with solution containing  Ancef, gentamicin, and bacitracin. Hemostasis was ensured. Cavity irrigated with Betadine. The tissue expander was prepared and placed in submuscular position. The expander was secured to chest wall with a 3-0 vicryl. The inferior border of the acellular dermis was inset to chest wall and inframammary fold fascia in a 3 point suture fashion. Laterally the acellular dermis was draped over the lateral border of expander and secured to serratus fascia. 0- vicryl suture was used to advance the posterior axillary line soft tissue anteriorly and secured to serratus and acellular dermis. The axillary soft tissue of mastectomy flap was advanced inferiorly and secured to pectoralis muscle with 0-vicryl. The incision was closed with 3-0 vicryl in fascial layer and 4-0 vicryl in dermis. Skin closure completed with 4-0 monocryl subcuticular and tissue adhesive. Over right breast, similarly the inferior pectoralis insertions were elevated with anterior rectus fascia below inframammary fold. Acellular dermis perforated and sewn to lateral border pectoralis muscle.A 19 Fr drain was placed and secured to skin with 2-0 nylon. Tissue expander placed and secured to chest with 3-0 vicryl. The inferior border of the acellular dermis was inset to serratus fascia. 0- vicryl suture was used to advance the posterior axillary line soft tissue anteriorly and secured to serratus and acellular dermis. The axillary soft tissue of mastectomy flap was advanced inferiorly and secured to pectoralis muscle with 0-vicryl. Closure completed in similar fashion. The ports were accessed and filled to 300 ml bilaterally. The patient was brought to upright position and the skin flaps were redraped so that NAC was symmetric from the sternal notch and midline. Transparent, adherent dressings applied. Dry dressing and breast binder applied.  The patient was allowed to wake from anesthesia, extubated and taken to the recovery room in satisfactory  condition.  SPECIMENS: none  DRAINS: 19 Fr JP in right and left breast   [name], MD [facility] Plastic & Reconstructive Surgery [phone], pin [phone]

## 2016-01-05 NOTE — Anesthesia Preprocedure Evaluation (Addendum)
Anesthesia Evaluation  Patient identified by MRN, date of birth, ID band Patient awake    Reviewed: Allergy & Precautions, NPO status , Patient's Chart, lab work & pertinent test results  Airway Mallampati: II  TM Distance: >3 FB Neck ROM: Full    Dental  (+) Teeth Intact   Pulmonary sleep apnea ,    breath sounds clear to auscultation       Cardiovascular negative cardio ROS   Rhythm:Regular     Neuro/Psych PSYCHIATRIC DISORDERS Anxiety Depression    GI/Hepatic Neg liver ROS, GERD  ,  Endo/Other  Morbid obesity  Renal/GU negative Renal ROS     Musculoskeletal   Abdominal   Peds  Hematology   Anesthesia Other Findings   Reproductive/Obstetrics                           Anesthesia Physical Anesthesia Plan  ASA: II  Anesthesia Plan: General and Regional   Post-op Pain Management:    Induction: Intravenous  Airway Management Planned: Oral ETT  Additional Equipment: None  Intra-op Plan: Delibrate Circulatory arrest per surgeon request  Post-operative Plan: Extubation in OR  Informed Consent: I have reviewed the patients History and Physical, chart, labs and discussed the procedure including the risks, benefits and alternatives for the proposed anesthesia with the patient or authorized representative who has indicated his/her understanding and acceptance.   Dental advisory given  Plan Discussed with: CRNA and Surgeon  Anesthesia Plan Comments:        Anesthesia Quick Evaluation

## 2016-01-05 NOTE — Interval H&P Note (Signed)
History and Physical Interval Note:  01/05/2016 7:15 AM  Alejandra Harmon T King  has presented today for surgery, with the diagnosis of PALB2 MUTATION  The various methods of treatment have been discussed with the patient and family. After consideration of risks, benefits and other options for treatment, the patient has consented to  Procedure(s): BILATERAL PROPHYLATIC NIPPLE SPARING MASTECTOMY (Bilateral) BREAST RECONSTRUCTION WITH PLACEMENT OF TISSUE EXPANDER AND POSSIBLE CORTIVA (Bilateral) as a surgical intervention .  The patient's history has been reviewed, patient examined, no change in status, stable for surgery.  I have reviewed the patient's chart and labs.  Questions were answered to the patient's satisfaction.     Braeley Buskey

## 2016-01-05 NOTE — Anesthesia Procedure Notes (Signed)
Procedure Name: Intubation Date/Time: 01/05/2016 7:51 AM Performed by: Rejeana Brock L Pre-anesthesia Checklist: Patient identified, Emergency Drugs available, Suction available and Patient being monitored Patient Re-evaluated:Patient Re-evaluated prior to inductionOxygen Delivery Method: Circle System Utilized Preoxygenation: Pre-oxygenation with 100% oxygen Intubation Type: IV induction Ventilation: Mask ventilation without difficulty Laryngoscope Size: Miller and 2 Grade View: Grade I Tube type: Oral Tube size: 7.0 mm Number of attempts: 1 Airway Equipment and Method: Stylet and Oral airway Placement Confirmation: ETT inserted through vocal cords under direct vision,  positive ETCO2 and breath sounds checked- equal and bilateral Secured at: 22 cm Tube secured with: Tape Dental Injury: Teeth and Oropharynx as per pre-operative assessment

## 2016-01-05 NOTE — Interval H&P Note (Signed)
History and Physical Interval Note:  01/05/2016 7:13 AM  Alejandra Harmon  has presented today for surgery, with the diagnosis of PALB2 MUTATION  The various methods of treatment have been discussed with the patient and family. After consideration of risks, benefits and other options for treatment, the patient has consented to  Procedure(s): BILATERAL PROPHYLATIC NIPPLE SPARING MASTECTOMY (Bilateral) BREAST RECONSTRUCTION WITH PLACEMENT OF TISSUE EXPANDER AND POSSIBLE CORTIVA (Bilateral) as a surgical intervention .  The patient's history has been reviewed, patient examined, no change in status, stable for surgery.  I have reviewed the patient's chart and labs.  Questions were answered to the patient's satisfaction.     Makylie Rivere

## 2016-01-05 NOTE — H&P (Signed)
55 yof with PALB2 mutation who presents for conversation about prophy mastectomies. She is referred by Juanda Chance, NP. she has prior history of breast reduction in 2010 by Dr Nathanial Rancher. she also has bilateral radiologic core biopsies in past as well as cyst excision many years ago. she had mri 7/12 indeterminate enhancing 8 mm mass in the upper inner right breast. the left breast is normal. there is fat necrosis in bilateral uoq. mm 6/29 was negative. US shows a highly suspicious right breast mass measuring 4 mm in size. this underwent biopsy and is concordant with fat necrosis. she has family history of three sisters and her mother as well as cousins with breast cancer. she has no current mass or dc.  Other Problems  Back Pain Chest pain Cholelithiasis Gastroesophageal Reflux Disease Hemorrhoids Lump In Breast  Past Surgical History  Breast Biopsy Bilateral. Breast Mass; Local Excision Right. Cesarean Section - 1 Gallbladder Surgery - Laparoscopic Hysterectomy (not due to cancer) - Complete Mammoplasty; Reduction Bilateral.  Diagnostic Studies History  Colonoscopy 1-5 years ago Mammogram within last year Pap Smear 1-5 years ago  Allergies  Sulfa Antibiotics  Medication History  Ambien ('5MG'$  Tablet, Oral) Active. Medications Reconciled  Social History  Caffeine use Coffee. No alcohol use No drug use Tobacco use Never smoker.  Family History Breast Cancer Mother, Sister. Cancer Brother, Mother, Sister. Cerebrovascular Accident Mother. Depression Sister. Diabetes Mellitus Brother, Mother, Sister. Heart Disease Brother. Hypertension Brother, Mother, Sister. Kidney Disease Mother. Prostate Cancer Brother. Respiratory Condition Sister. Seizure disorder Brother.  Pregnancy / Birth History  Age at menarche 24 years. Age of menopause [age] Contraceptive History Oral contraceptives. Gravida 3 Length (months) of  breastfeeding 3-6 Maternal age 52-25 Para 1 Regular periods   Review of Systems General Present- Fatigue. Not Present- Appetite Loss, Chills, Fever, Night Sweats, Weight Gain and Weight Loss. Skin Not Present- Change in Wart/Mole, Dryness, Hives, Jaundice, New Lesions, Non-Healing Wounds, Rash and Ulcer. HEENT Present- Seasonal Allergies and Wears glasses/contact lenses. Not Present- Earache, Hearing Loss, Hoarseness, Nose Bleed, Oral Ulcers, Ringing in the Ears, Sinus Pain, Sore Throat, Visual Disturbances and Yellow Eyes. Breast Present- Breast Mass and Breast Pain. Not Present- Nipple Discharge and Skin Changes. Cardiovascular Present- Swelling of Extremities. Not Present- Chest Pain, Difficulty Breathing Lying Down, Leg Cramps, Palpitations, Rapid Heart Rate and Shortness of Breath. Gastrointestinal Present- Bloating. Not Present- Abdominal Pain, Bloody Stool, Change in Bowel Habits, Chronic diarrhea, Constipation, Difficulty Swallowing, Excessive gas, Gets full quickly at meals, Hemorrhoids, Indigestion, Nausea, Rectal Pain and Vomiting. Female Genitourinary Not Present- Frequency, Nocturia, Painful Urination, Pelvic Pain and Urgency. Musculoskeletal Present- Joint Stiffness and Swelling of Extremities. Not Present- Back Pain, Joint Pain, Muscle Pain and Muscle Weakness. Neurological Present- Trouble walking. Not Present- Decreased Memory, Fainting, Headaches, Numbness, Seizures, Tingling, Tremor and Weakness. Psychiatric Not Present- Anxiety, Bipolar, Change in Sleep Pattern, Depression, Fearful and Frequent crying. Endocrine Present- Hot flashes. Not Present- Cold Intolerance, Excessive Hunger, Hair Changes, Heat Intolerance and New Diabetes. Hematology Present- Easy Bruising. Not Present- Blood Thinners, Excessive bleeding, Gland problems, HIV and Persistent Infections.  Vitals  Weight: 191 lb Height: 74in Body Surface Area: 2.13 m Body Mass Index: 24.52 kg/m  Temp.:  30F(Temporal)  Pulse: 79 (Regular)  BP: 126/80 (Sitting, Left Arm, Standard)  Physical Exam  General Mental Status-Alert. Orientation-Oriented X3. Eye Sclera/Conjunctiva - Bilateral-No scleral icterus. Chest and Lung Exam Chest and lung exam reveals -quiet, even and easy respiratory effort with no use of accessory muscles and on auscultation,  normal breath sounds, no adventitious sounds and normal vocal resonance. Breast Nipples-No Discharge. Breast Lump-No Palpable Breast Mass. Well healed scars Cardiovascular Cardiovascular examination reveals -normal heart sounds, regular rate and rhythm with no murmurs. Lymphatic Head & Neck General Head & Neck Lymphatics: Bilateral - Description - Normal. Axillary General Axillary Region: Bilateral - Description - Normal. 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 due to family history and recent biopsy she would like to consider surgery. we discussed risk reduction surgery. we discussed there still is up to 5% breast cancer rate as well. I think even though she has had reduction (this was 7 years ago) reasonable to consider nsm. no nodal evaluation is needed. we discussed risks of partial or full thickness skin necrosis, nipple loss, cancer in nipple specimen necessitating removal and loss of nipple sensation. we discussed reconstruction as well as incision placement.

## 2016-01-05 NOTE — Transfer of Care (Signed)
Immediate Anesthesia Transfer of Care Note  Patient: Alejandra Harmon  Procedure(s) Performed: Procedure(s): BILATERAL PROPHYLATIC NIPPLE SPARING MASTECTOMIES (Bilateral) BILATERAL BREAST RECONSTRUCTION WITH PLACEMENT OF TISSUE EXPANDERS AND CORTIVA TISSUE (Bilateral)  Patient Location: PACU  Anesthesia Type:General  Level of Consciousness: awake  Airway & Oxygen Therapy: Patient Spontanous Breathing and Patient connected to nasal cannula oxygen  Post-op Assessment: Report given to RN, Post -op Vital signs reviewed and stable and Patient moving all extremities X 4  Post vital signs: Reviewed and stable  Last Vitals:  Vitals:   01/05/16 0617  BP: (!) 157/89  Pulse: 66  Resp: 20  Temp: 36.7 C    Last Pain:  Vitals:   01/05/16 0617  TempSrc: Oral      Patients Stated Pain Goal: 3 ([id number])  Complications: No apparent anesthesia complications

## 2016-01-05 NOTE — Op Note (Signed)
Preoperative diagnosis: PALB2 mutation, high risk for breast cancer, significant family history Postoperative diagnosis: same as above Procedure: Right prophylactic nipple sparing mastectomy, Left prophylactic nipple sparing mastectomy Surgeon: Dr Serita Grammes Asst: Dr [name] Anesthesia: general with bilateral pectoral block EBL: minimal Drains per plastic surgery Specimens: right and left breast marked short superior long lateral and double mark nipple areolar margin, right and left nipples Complications none Sponge and needle count correct Dispo case turned over to plastics  Indications: This is a 52 yof with a PALB2 mutation and a significant family history of breast cancer. We discussed all options and she desires surgical therapy for prophylaxis. We discussed bilateral nsm with immediate reconstruction. She has prior reduction.   Procedure: After informed consent was obtained the patient was taken to the OR. She was given antibiotics and scds were in place. She had bilateral pectoralblocks. She was placed under general anesthesia without complication. She was prepped and draped in the standard sterile surgical fashion. A timeout was performed.   I made a 12 cm inframammary incision about 9cm from sternum on the leftside initially. this was done on her old incision.  I then created a posterior flap removing the fascia from the pectoralis muscle. There were numerous preexisting sutures and a lot of scar tissue from her reduction. This was done to the parasternal region, clavicle and latissimus laterally. I then created the anterior flap. The breast was then removed in its entirety. The skin and nac were all viable. The breast was marked as above. I removed the nipple margin as a separate specimen. Hemostasis was obtained. I then packed this side and went to the rightside.  I then made a 12cm incision on the right in the location of the old IM incision.  This was  9cm from the sternum. I then removed the breast tissue in same fashion as above. I also removed the nipple specimen and sent this separately. Hemostasis was obtained. Case was turned over to plastic surgery for completion.

## 2016-01-05 NOTE — Interval H&P Note (Signed)
History and Physical Interval Note:  01/05/2016 6:57 AM  Alejandra Harmon  has presented today for surgery, with the diagnosis of PALB2 MUTATION  The various methods of treatment have been discussed with the patient and family. After consideration of risks, benefits and other options for treatment, the patient has consented to  Procedure(s): BILATERAL PROPHYLATIC NIPPLE SPARING MASTECTOMY (Bilateral) BREAST RECONSTRUCTION WITH PLACEMENT OF TISSUE EXPANDER AND POSSIBLE CORTIVA (Bilateral) as a surgical intervention .  The patient's history has been reviewed, patient examined, no change in status, stable for surgery.  I have reviewed the patient's chart and labs.  Questions were answered to the patient's satisfaction.     Levi Crass

## 2016-01-06 ENCOUNTER — Encounter (HOSPITAL_COMMUNITY): Payer: Self-pay | Admitting: General Surgery

## 2016-01-06 DIAGNOSIS — Z4001 Encounter for prophylactic removal of breast: Secondary | ICD-10-CM | POA: Diagnosis not present

## 2016-01-06 MED ORDER — OXYCODONE HCL 5 MG PO TABS: 5.0000 mg | ORAL_TABLET | ORAL | 0 refills | Status: DC | PRN

## 2016-01-06 MED ORDER — SENNOSIDES-DOCUSATE SODIUM 8.6-50 MG PO TABS
1.0000 | ORAL_TABLET | Freq: Once | ORAL | Status: AC
  Administered 2016-01-06: 1 via ORAL
  Filled 2016-01-06: qty 1

## 2016-01-06 MED ORDER — METHOCARBAMOL 500 MG PO TABS: 500.0000 mg | ORAL_TABLET | Freq: Three times a day (TID) | ORAL | 0 refills | Status: DC | PRN

## 2016-01-06 MED ORDER — DOXYCYCLINE HYCLATE 50 MG PO CAPS: 50.0000 mg | ORAL_CAPSULE | Freq: Two times a day (BID) | ORAL | 0 refills | Status: DC

## 2016-01-06 MED ORDER — BUPIVACAINE-EPINEPHRINE (PF) 0.5% -1:200000 IJ SOLN
INTRAMUSCULAR | Status: DC | PRN
  Administered 2016-01-05 (×2): 30 mL via PERINEURAL

## 2016-01-06 NOTE — Anesthesia Procedure Notes (Addendum)
Anesthesia Regional Block:  Pectoralis block  Pre-Anesthetic Checklist: ,, timeout performed, Correct Patient, Correct Site, Correct Laterality, Correct Procedure, Correct Position, risks and benefits discussed, surgical consent, pre-op evaluation,  At surgeon's request and post-op pain management  Laterality: Left  Prep: chloraprep       Needles:  Injection technique: Single-shot  Needle Type: Echogenic Needle          Additional Needles:  Procedures: ultrasound guided (picture in chart) Pectoralis block Narrative:  Injection made incrementally with aspirations every 5 mL.  Performed by: Personally   Additional Notes: H+P and labs reviewed, risks and benefits discussed with patient, procedure tolerated well without complications

## 2016-01-06 NOTE — Anesthesia Postprocedure Evaluation (Signed)
Anesthesia Post Note  Patient: Natsha T Freer  Procedure(s) Performed: Procedure(s) (LRB): BILATERAL PROPHYLATIC NIPPLE SPARING MASTECTOMIES (Bilateral) BILATERAL BREAST RECONSTRUCTION WITH PLACEMENT OF TISSUE EXPANDERS AND CORTIVA TISSUE (Bilateral)  Patient location during evaluation: PACU Anesthesia Type: General and Regional Level of consciousness: awake Pain management: pain level controlled Vital Signs Assessment: post-procedure vital signs reviewed and stable Respiratory status: spontaneous breathing Cardiovascular status: stable Postop Assessment: no signs of nausea or vomiting Anesthetic complications: no    Last Vitals:  Vitals:   01/06/16 0636 01/06/16 1332  BP: 132/71 116/65  Pulse: 68 73  Resp: 16 16  Temp: 37.1 C 37.1 C    Last Pain:  Vitals:   01/06/16 1449  TempSrc:   PainSc: 0-No pain                 Talis Iwan

## 2016-01-06 NOTE — Anesthesia Procedure Notes (Addendum)
Anesthesia Regional Block:  Pectoralis block  Pre-Anesthetic Checklist: ,, timeout performed, Correct Patient, Correct Site, Correct Laterality, Correct Procedure, Correct Position, risks and benefits discussed, surgical consent, pre-op evaluation,  At surgeon's request and post-op pain management  Laterality: Right  Prep: chloraprep       Needles:  Injection technique: Single-shot  Needle Type: Echogenic Needle          Additional Needles:  Procedures: ultrasound guided (picture in chart) Pectoralis block Narrative:  Injection made incrementally with aspirations every 5 mL.  Performed by: Personally   Additional Notes: H+P and labs reviewed, risks and benefits discussed with patient, procedure tolerated well without complications

## 2016-01-06 NOTE — Progress Notes (Signed)
Patient ID: Alejandra Harmon, female   DOB: 09-26-1960, 55 y.o.   MRN: [id number] Doing well, dc today, will call with path

## 2016-01-06 NOTE — Discharge Summary (Signed)
Physician Discharge Summary  Patient ID: Alejandra Harmon MRN: [id number] DOB/AGE: 53-Jul-1962 53 y.o.  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 She tolerated diet and oral pain medication. Ambulatory with minimal assist.  Treatments: surgery: bilateral nipple sparing mastectomies prophylactic with tissue expander, acellular dermis reconstruction  Discharge Exam: Blood pressure 132/71, pulse 68, temperature 98.7 F (37.1 C), temperature source Oral, resp. rate 16, height _0  (1.575 m), weight 88.9 kg (196 lb), last menstrual period 02/10/2015, SpO2 97 %. Incision/Wound: chest flat, flaps viable, drains watery serosanguinous  Disposition: 01-Home or Self Care  Discharge Instructions    Call MD for:  redness, tenderness, or signs of infection (pain, swelling, bleeding, redness, odor or green/yellow discharge around incision site)    Complete by:  As directed    Call MD for:  temperature >100.5    Complete by:  As directed    Discharge instructions    Complete by:  As directed    Ok to remove dressings and shower am 11.2.17. Soap and water ok, pat Tegaderms dry. Leave Tegaderms in place until follow up visit. No creams or ointments over incisions. Do not let drains dangle in shower, attach to lanyard or similar.Strip and record drains twice daily and bring log to clinic visit.  Breast binder or soft compression bra all other times.  Ok to raise arms above shoulders for bathing and dressing.  No house yard work or exercise until cleared by MD.   Driving Restrictions    Complete by:  As directed    No driving if taking narcotics   Lifting restrictions    Complete by:  As directed    No lifting  greater than 5 lbs   Resume previous diet    Complete by:  As directed        Medication List    STOP taking these medications   oxyCODONE-acetaminophen 5-325 MG tablet Commonly known as:  PERCOCET/ROXICET     TAKE these medications   doxycycline 50 MG capsule Commonly known as:  VIBRAMYCIN Take 1 capsule (50 mg total) by mouth 2 (two) times daily.   ibuprofen 600 MG tablet Commonly known as:  ADVIL,MOTRIN Take 1 tablet (600 mg total) by mouth every 6 (six) hours as needed (mild pain).   methocarbamol 500 MG tablet Commonly known as:  ROBAXIN Take 1 tablet (500 mg total) by mouth every 8 (eight) hours as needed for muscle spasms.   oxyCODONE 5 MG immediate release tablet Commonly known as:  Oxy IR/ROXICODONE Take 1-2 tablets (5-10 mg total) by mouth every 4 (four) hours as needed for moderate pain.      Follow-up Information    WAKEFIELD,MATTHEW, MD In 2 weeks.   Specialty:  General Surgery Contact information: [address] [place] [address] [phone]        [name], MD In 1 week.   Specialty:  Plastic Surgery Why:  as scheduled Contact information: [place] [address]  [address] [phone]           Signed: [name] 01/06/2016, 7:36 AM

## 2016-01-24 ENCOUNTER — Encounter (HOSPITAL_COMMUNITY): Payer: Self-pay | Admitting: *Deleted

## 2016-01-24 ENCOUNTER — Encounter (HOSPITAL_COMMUNITY): Admission: EM | Disposition: A | Discharge status: Home / Self Care | Payer: Self-pay | Attending: Plastic Surgery

## 2016-01-24 ENCOUNTER — Observation Stay (HOSPITAL_COMMUNITY)
Admission: EM | Admit: 2016-01-24 | Discharge: 2016-01-25 | Disposition: A | Discharge status: Home / Self Care | Payer: BLUE CROSS/BLUE SHIELD | Source: Intra-hospital | Attending: Plastic Surgery | Admitting: Plastic Surgery

## 2016-01-24 ENCOUNTER — Ambulatory Visit (HOSPITAL_COMMUNITY): Payer: BLUE CROSS/BLUE SHIELD | Admitting: Anesthesiology

## 2016-01-24 DIAGNOSIS — T814XXA Infection following a procedure, initial encounter: Secondary | ICD-10-CM | POA: Insufficient documentation

## 2016-01-24 DIAGNOSIS — Z803 Family history of malignant neoplasm of breast: Secondary | ICD-10-CM | POA: Diagnosis not present

## 2016-01-24 DIAGNOSIS — Z9013 Acquired absence of bilateral breasts and nipples: Secondary | ICD-10-CM | POA: Insufficient documentation

## 2016-01-24 DIAGNOSIS — Z1501 Genetic susceptibility to malignant neoplasm of breast: Secondary | ICD-10-CM | POA: Insufficient documentation

## 2016-01-24 DIAGNOSIS — Y834 Other reconstructive surgery as the cause of abnormal reaction of the patient, or of later complication, without mention of misadventure at the time of the procedure: Secondary | ICD-10-CM | POA: Diagnosis not present

## 2016-01-24 DIAGNOSIS — L03313 Cellulitis of chest wall: Secondary | ICD-10-CM | POA: Diagnosis not present

## 2016-01-24 DIAGNOSIS — L7634 Postprocedural seroma of skin and subcutaneous tissue following other procedure: Secondary | ICD-10-CM | POA: Insufficient documentation

## 2016-01-24 DIAGNOSIS — Z9010 Acquired absence of unspecified breast and nipple: Secondary | ICD-10-CM

## 2016-01-24 HISTORY — PX: TISSUE EXPANDER PLACEMENT: SHX2530

## 2016-01-24 LAB — BASIC METABOLIC PANEL
Anion gap: 11 (ref 5–15)
BUN: 6 mg/dL (ref 6–20)
CO2: 25 mmol/L (ref 22–32)
Calcium: 9.8 mg/dL (ref 8.9–10.3)
Chloride: 105 mmol/L (ref 101–111)
Creatinine, Ser: 0.76 mg/dL (ref 0.44–1.00)
GFR calc Af Amer: >60 mL/min (ref >60)
GFR calc non Af Amer: >60 mL/min (ref >60)
Glucose, Bld: 124 mg/dL — ABNORMAL HIGH (ref 65–99)
Potassium: 3.6 mmol/L (ref 3.5–5.1)
Sodium: 141 mmol/L (ref 135–145)

## 2016-01-24 LAB — CBC
HCT: 38.9 % (ref 36.0–46.0)
Hemoglobin: 12.7 g/dL (ref 12.0–15.0)
MCH: 27.5 pg (ref 26.0–34.0)
MCHC: 32.6 g/dL (ref 30.0–36.0)
MCV: 84.4 fL (ref 78.0–100.0)
Platelets: 359 10*3/uL (ref 150–400)
RBC: 4.61 MIL/uL (ref 3.87–5.11)
RDW: 13.9 % (ref 11.5–15.5)
WBC: 12.2 10*3/uL — ABNORMAL HIGH (ref 4.0–10.5)

## 2016-01-24 SURGERY — INSERTION, TISSUE EXPANDER
Anesthesia: General | Site: Breast | Laterality: Right

## 2016-01-24 MED ORDER — FENTANYL CITRATE (PF) 100 MCG/2ML IJ SOLN
INTRAMUSCULAR | Status: AC
  Administered 2016-01-24: 50 ug via INTRAVENOUS
  Filled 2016-01-24: qty 2

## 2016-01-24 MED ORDER — ZOLPIDEM TARTRATE 5 MG PO TABS: 5.0000 mg | ORAL_TABLET | Freq: Every evening | ORAL | Status: DC | PRN

## 2016-01-24 MED ORDER — ACETAMINOPHEN 80 MG PO CHEW
650.0000 mg | CHEWABLE_TABLET | Freq: Four times a day (QID) | ORAL | Status: DC | PRN
  Filled 2016-01-24: qty 8

## 2016-01-24 MED ORDER — CHLORHEXIDINE GLUCONATE CLOTH 2 % EX PADS: 6.0000 | MEDICATED_PAD | Freq: Once | CUTANEOUS | Status: DC

## 2016-01-24 MED ORDER — FENTANYL CITRATE (PF) 100 MCG/2ML IJ SOLN
INTRAMUSCULAR | Status: DC | PRN
  Administered 2016-01-24: 50 ug via INTRAVENOUS
  Administered 2016-01-24: 100 ug via INTRAVENOUS
  Administered 2016-01-24 (×3): 50 ug via INTRAVENOUS
  Administered 2016-01-24: 100 ug via INTRAVENOUS
  Administered 2016-01-24 (×3): 50 ug via INTRAVENOUS

## 2016-01-24 MED ORDER — FENTANYL CITRATE (PF) 100 MCG/2ML IJ SOLN
INTRAMUSCULAR | Status: AC
  Filled 2016-01-24: qty 2

## 2016-01-24 MED ORDER — METHOCARBAMOL 500 MG PO TABS
500.0000 mg | ORAL_TABLET | Freq: Three times a day (TID) | ORAL | Status: DC | PRN
  Administered 2016-01-25: 500 mg via ORAL
  Filled 2016-01-24: qty 1

## 2016-01-24 MED ORDER — OXYCODONE HCL 5 MG PO TABS
5.0000 mg | ORAL_TABLET | ORAL | Status: DC | PRN
  Administered 2016-01-24 – 2016-01-25 (×2): 10 mg via ORAL
  Filled 2016-01-24 (×2): qty 2

## 2016-01-24 MED ORDER — SODIUM CHLORIDE 0.9 % IV SOLN
INTRAVENOUS | Status: DC | PRN
  Administered 2016-01-24: 1000 mL

## 2016-01-24 MED ORDER — PROMETHAZINE HCL 25 MG/ML IJ SOLN: 6.2500 mg | INTRAMUSCULAR | Status: DC | PRN

## 2016-01-24 MED ORDER — DEXAMETHASONE SODIUM PHOSPHATE 10 MG/ML IJ SOLN
INTRAMUSCULAR | Status: AC
  Filled 2016-01-24: qty 1

## 2016-01-24 MED ORDER — ONDANSETRON 4 MG PO TBDP: 4.0000 mg | ORAL_TABLET | Freq: Four times a day (QID) | ORAL | Status: DC | PRN

## 2016-01-24 MED ORDER — LIDOCAINE 2% (20 MG/ML) 5 ML SYRINGE
INTRAMUSCULAR | Status: AC
  Filled 2016-01-24: qty 5

## 2016-01-24 MED ORDER — ONDANSETRON HCL 4 MG/2ML IJ SOLN: 4.0000 mg | Freq: Four times a day (QID) | INTRAMUSCULAR | Status: DC | PRN

## 2016-01-24 MED ORDER — MIDAZOLAM HCL 2 MG/2ML IJ SOLN
INTRAMUSCULAR | Status: AC
  Filled 2016-01-24: qty 2

## 2016-01-24 MED ORDER — SODIUM CHLORIDE 0.9 % IR SOLN
Status: DC
  Filled 2016-01-24: qty 1000

## 2016-01-24 MED ORDER — MIDAZOLAM HCL 5 MG/5ML IJ SOLN
INTRAMUSCULAR | Status: DC | PRN
  Administered 2016-01-24: 2 mg via INTRAVENOUS

## 2016-01-24 MED ORDER — ARTIFICIAL TEARS OP OINT
TOPICAL_OINTMENT | OPHTHALMIC | Status: AC
  Filled 2016-01-24: qty 3.5

## 2016-01-24 MED ORDER — 0.9 % SODIUM CHLORIDE (POUR BTL) OPTIME
TOPICAL | Status: DC | PRN
  Administered 2016-01-24 (×2): 1000 mL

## 2016-01-24 MED ORDER — SODIUM CHLORIDE 0.9 % IR SOLN
Status: DC | PRN
  Administered 2016-01-24: 1000 mL

## 2016-01-24 MED ORDER — SCOPOLAMINE 1 MG/3DAYS TD PT72
MEDICATED_PATCH | TRANSDERMAL | Status: AC
  Filled 2016-01-24: qty 1

## 2016-01-24 MED ORDER — WHITE PETROLATUM GEL
Status: AC
  Administered 2016-01-24: 17:00:00
  Filled 2016-01-24: qty 1

## 2016-01-24 MED ORDER — VANCOMYCIN HCL IN DEXTROSE 1-5 GM/200ML-% IV SOLN
INTRAVENOUS | Status: AC
  Administered 2016-01-24: 1000 mg via INTRAVENOUS
  Filled 2016-01-24: qty 200

## 2016-01-24 MED ORDER — ACETAMINOPHEN 325 MG PO TABS: 650.0000 mg | ORAL_TABLET | Freq: Four times a day (QID) | ORAL | Status: DC | PRN

## 2016-01-24 MED ORDER — SCOPOLAMINE 1 MG/3DAYS TD PT72
1.0000 | MEDICATED_PATCH | TRANSDERMAL | Status: DC
  Administered 2016-01-24: 1.5 mg via TRANSDERMAL

## 2016-01-24 MED ORDER — DEXAMETHASONE SODIUM PHOSPHATE 10 MG/ML IJ SOLN
INTRAMUSCULAR | Status: DC | PRN
  Administered 2016-01-24: 10 mg via INTRAVENOUS

## 2016-01-24 MED ORDER — ROCURONIUM BROMIDE 10 MG/ML (PF) SYRINGE
PREFILLED_SYRINGE | INTRAVENOUS | Status: AC
  Filled 2016-01-24: qty 10

## 2016-01-24 MED ORDER — KCL IN DEXTROSE-NACL 20-5-0.45 MEQ/L-%-% IV SOLN
INTRAVENOUS | Status: DC
  Administered 2016-01-24 – 2016-01-25 (×2): via INTRAVENOUS
  Filled 2016-01-24 (×2): qty 1000

## 2016-01-24 MED ORDER — VANCOMYCIN HCL IN DEXTROSE 1-5 GM/200ML-% IV SOLN
1000.0000 mg | INTRAVENOUS | Status: AC
  Administered 2016-01-24: 1000 mg via INTRAVENOUS
  Filled 2016-01-24: qty 200

## 2016-01-24 MED ORDER — LACTATED RINGERS IV SOLN
INTRAVENOUS | Status: DC
  Administered 2016-01-24 (×2): via INTRAVENOUS

## 2016-01-24 MED ORDER — LIDOCAINE HCL (CARDIAC) 20 MG/ML IV SOLN
INTRAVENOUS | Status: DC | PRN
  Administered 2016-01-24: 60 mg via INTRAVENOUS

## 2016-01-24 MED ORDER — CEFAZOLIN SODIUM-DEXTROSE 2-4 GM/100ML-% IV SOLN
2.0000 g | Freq: Three times a day (TID) | INTRAVENOUS | Status: DC
  Administered 2016-01-24 – 2016-01-25 (×2): 2 g via INTRAVENOUS
  Filled 2016-01-24 (×4): qty 100

## 2016-01-24 MED ORDER — PROPOFOL 10 MG/ML IV BOLUS
INTRAVENOUS | Status: AC
  Filled 2016-01-24: qty 20

## 2016-01-24 MED ORDER — ONDANSETRON HCL 4 MG/2ML IJ SOLN
INTRAMUSCULAR | Status: DC | PRN
  Administered 2016-01-24: 4 mg via INTRAVENOUS

## 2016-01-24 MED ORDER — HYDROMORPHONE HCL 2 MG/ML IJ SOLN: 0.5000 mg | INTRAMUSCULAR | Status: DC | PRN

## 2016-01-24 MED ORDER — PROPOFOL 10 MG/ML IV BOLUS
INTRAVENOUS | Status: DC | PRN
  Administered 2016-01-24: 50 mg via INTRAVENOUS
  Administered 2016-01-24: 200 mg via INTRAVENOUS
  Administered 2016-01-24: 50 mg via INTRAVENOUS

## 2016-01-24 MED ORDER — KETOROLAC TROMETHAMINE 30 MG/ML IJ SOLN
30.0000 mg | Freq: Three times a day (TID) | INTRAMUSCULAR | Status: AC
  Administered 2016-01-24 – 2016-01-25 (×3): 30 mg via INTRAVENOUS
  Filled 2016-01-24 (×3): qty 1

## 2016-01-24 MED ORDER — ONDANSETRON HCL 4 MG/2ML IJ SOLN
INTRAMUSCULAR | Status: AC
  Filled 2016-01-24: qty 2

## 2016-01-24 MED ORDER — FENTANYL CITRATE (PF) 100 MCG/2ML IJ SOLN
INTRAMUSCULAR | Status: AC
  Filled 2016-01-24: qty 4

## 2016-01-24 MED ORDER — FENTANYL CITRATE (PF) 100 MCG/2ML IJ SOLN
25.0000 ug | INTRAMUSCULAR | Status: DC | PRN
  Administered 2016-01-24 (×2): 50 ug via INTRAVENOUS

## 2016-01-24 SURGICAL SUPPLY — 43 items
ADH SKN CLS APL DERMABOND .7 (GAUZE/BANDAGES/DRESSINGS) ×1
BAG DECANTER FOR FLEXI CONT (MISCELLANEOUS) ×3 IMPLANT
BINDER BREAST LRG (GAUZE/BANDAGES/DRESSINGS) IMPLANT
BINDER BREAST XLRG (GAUZE/BANDAGES/DRESSINGS) ×2 IMPLANT
BLADE 10 SAFETY STRL DISP (BLADE) ×1 IMPLANT
CANISTER SUCTION 2500CC (MISCELLANEOUS) ×3 IMPLANT
CHLORAPREP W/TINT 26ML (MISCELLANEOUS) ×3 IMPLANT
COVER SURGICAL LIGHT HANDLE (MISCELLANEOUS) ×3 IMPLANT
DERMABOND ADVANCED (GAUZE/BANDAGES/DRESSINGS) ×2
DERMABOND ADVANCED .7 DNX12 (GAUZE/BANDAGES/DRESSINGS) ×1 IMPLANT
DRAIN CHANNEL 15F RND FF W/TCR (WOUND CARE) IMPLANT
DRAIN CHANNEL 19F RND (DRAIN) ×3 IMPLANT
DRAPE ORTHO SPLIT 77X108 STRL (DRAPES)
DRAPE SURG ORHT 6 SPLT 77X108 (DRAPES) IMPLANT
DRAPE WARM FLUID 44X44 (DRAPE) IMPLANT
DRSG PAD ABDOMINAL 8X10 ST (GAUZE/BANDAGES/DRESSINGS) ×3 IMPLANT
ELECT BLADE 4.0 EZ CLEAN MEGAD (MISCELLANEOUS) ×3
ELECT REM PT RETURN 9FT ADLT (ELECTROSURGICAL) ×3
ELECTRODE BLDE 4.0 EZ CLN MEGD (MISCELLANEOUS) ×1 IMPLANT
ELECTRODE REM PT RTRN 9FT ADLT (ELECTROSURGICAL) ×1 IMPLANT
EVACUATOR SILICONE 100CC (DRAIN) ×3 IMPLANT
EXPANDER BREAST FX 550CC (Breast) ×2 IMPLANT
GAUZE SPONGE 4X4 12PLY STRL (GAUZE/BANDAGES/DRESSINGS) IMPLANT
GLOVE BIO SURGEON STRL SZ 6 (GLOVE) ×9 IMPLANT
GLOVE SURG SS PI 6.0 STRL IVOR (GLOVE) ×3 IMPLANT
GOWN STRL REUS W/ TWL LRG LVL3 (GOWN DISPOSABLE) ×2 IMPLANT
GOWN STRL REUS W/TWL LRG LVL3 (GOWN DISPOSABLE) ×6
KIT BASIN OR (CUSTOM PROCEDURE TRAY) ×3 IMPLANT
KIT ROOM TURNOVER OR (KITS) ×3 IMPLANT
NS IRRIG 1000ML POUR BTL (IV SOLUTION) ×6 IMPLANT
PACK GENERAL/GYN (CUSTOM PROCEDURE TRAY) ×3 IMPLANT
PAD ARMBOARD 7.5X6 YLW CONV (MISCELLANEOUS) ×3 IMPLANT
PIN SAFETY STERILE (MISCELLANEOUS) ×3 IMPLANT
SOLUTION BETADINE 4OZ (MISCELLANEOUS) IMPLANT
STAPLER VISISTAT 35W (STAPLE) ×3 IMPLANT
SUT ETHILON 2 0 FS 18 (SUTURE) ×3 IMPLANT
SUT MNCRL AB 4-0 PS2 18 (SUTURE) ×3 IMPLANT
SUT VIC AB 3-0 PS2 18 (SUTURE) ×3
SUT VIC AB 3-0 PS2 18XBRD (SUTURE) ×1 IMPLANT
SUT VIC AB 4-0 PS2 27 (SUTURE) ×3 IMPLANT
TOWEL OR 17X24 6PK STRL BLUE (TOWEL DISPOSABLE) ×3 IMPLANT
TOWEL OR 17X26 10 PK STRL BLUE (TOWEL DISPOSABLE) ×3 IMPLANT
TRAY FOLEY CATH 14FRSI W/METER (CATHETERS) IMPLANT

## 2016-01-24 NOTE — Anesthesia Postprocedure Evaluation (Signed)
Anesthesia Post Note  Patient: Alejandra Harmon  Procedure(s) Performed: Procedure(s) (LRB): REMOVAL AND PLACEMENT OF TISSUE EXPANDER/RIGHT (Right)  Patient location during evaluation: PACU Anesthesia Type: General Level of consciousness: awake and alert Pain management: pain level controlled Vital Signs Assessment: post-procedure vital signs reviewed and stable Respiratory status: spontaneous breathing, nonlabored ventilation, respiratory function stable and patient connected to nasal cannula oxygen Cardiovascular status: blood pressure returned to baseline and stable Postop Assessment: no signs of nausea or vomiting Anesthetic complications: no    Last Vitals:  Vitals:   01/24/16 1334 01/24/16 1349  BP: (!) 201/99 (!) 187/96  Pulse: 81 70  Resp: (!) 24 18  Temp:      Last Pain:  Vitals:   01/24/16 1350  TempSrc:   PainSc: 6                  Darcell Sabino S

## 2016-01-24 NOTE — Transfer of Care (Signed)
Immediate Anesthesia Transfer of Care Note  Patient: Alejandra Harmon  Procedure(s) Performed: Procedure(s): REMOVAL AND PLACEMENT OF TISSUE EXPANDER/RIGHT (Right)  Patient Location: PACU  Anesthesia Type:General  Level of Consciousness: awake, alert , oriented and patient cooperative  Airway & Oxygen Therapy: Patient Spontanous Breathing and Patient connected to nasal cannula oxygen  Post-op Assessment: Report given to RN and Post -op Vital signs reviewed and stable  Post vital signs: Reviewed and stable  Last Vitals:  Vitals:   01/24/16 1043  BP: (!) 181/87  Resp: 20  Temp: 37.4 C    Last Pain:  Vitals:   01/24/16 1043  TempSrc: Oral  PainSc: 4       Patients Stated Pain Goal: 4 ([id number])  Complications: No apparent anesthesia complications

## 2016-01-24 NOTE — H&P (Signed)
Subjective:     Patient ID: Alejandra Harmon is a 55 y.o. female.  HPI  Nearly 3 weeks post op. Drains removed 5 days ago and spiked fever on that evening. Has been on Cipro and notes waxing and waning fevers since, onset erythema last evening and drainage from drain exit site starting this am.  Known PALB2 mutation with several family members with breast ca. Patient with screening MMG 06/2015 normal. Had diagnostic MMG 08/2015 for palpable mass and fat necrosis on right and cyst on left noted. MRI demonstrated 8 x 7 x 7 mm enhancing mass at 3 o clock. She underwent subsequent second look US with 4mm suspicious mass right and borderline prominent right axillary LN with a cortex measuring 4 mm. US guided biopsy with fat necrosis. She underwent repeat MRI to ensure mass biopsied and clip noted within mass. Final pathology benign bilateral.  History of breast reduction 2010 with Dr. Contogiannis. Prior to mastectomies B cup, down from D. Left mastectomy 843 g Right 835 g.  Review of Systems    Objective:   Physical Exam  Cardiovascular: Normal rate, regular rhythm and normal heart sounds.   Pulmonary/Chest: Effort normal and breath sounds normal.     Chest: cellulitis right lateral chest, drainage from drain site    Assessment:     PALB2 mutation, History breast reduction S/p bilateral NSM, TE/ADM (Cortiva) reconstruction    Plan:     Seroma right chest with cellulitis. Plan OR today for expander exchange, placement drain and washout cavity. Left appears benign. Plan overnight stay.   Natrelle 133 FX-13-T 550 ml tissue expanders placed bilateral,  RIGHT initial fill volume 390 ml  LEFT initial fill volume 390 ml    Brinda Thimmappa, MD MBA Plastic & Reconstructive Surgery [phone], pin [phone]   

## 2016-01-24 NOTE — Anesthesia Preprocedure Evaluation (Signed)
Anesthesia Evaluation  Patient identified by MRN, date of birth, ID band Patient awake    Reviewed: Allergy & Precautions, NPO status , Patient's Chart, lab work & pertinent test results  Airway Mallampati: II  TM Distance: >3 FB Neck ROM: Full    Dental no notable dental hx.    Pulmonary neg pulmonary ROS,    Pulmonary exam normal breath sounds clear to auscultation       Cardiovascular negative cardio ROS Normal cardiovascular exam Rhythm:Regular Rate:Normal     Neuro/Psych negative neurological ROS  negative psych ROS   GI/Hepatic negative GI ROS, Neg liver ROS,   Endo/Other  negative endocrine ROS  Renal/GU negative Renal ROS  negative genitourinary   Musculoskeletal negative musculoskeletal ROS (+)   Abdominal   Peds negative pediatric ROS (+)  Hematology negative hematology ROS (+)   Anesthesia Other Findings   Reproductive/Obstetrics negative OB ROS                             Anesthesia Physical Anesthesia Plan  ASA: II  Anesthesia Plan: General   Post-op Pain Management:    Induction: Intravenous  Airway Management Planned: LMA  Additional Equipment:   Intra-op Plan:   Post-operative Plan: Extubation in OR  Informed Consent: I have reviewed the patients History and Physical, chart, labs and discussed the procedure including the risks, benefits and alternatives for the proposed anesthesia with the patient or authorized representative who has indicated his/her understanding and acceptance.   Dental advisory given  Plan Discussed with: CRNA and Surgeon  Anesthesia Plan Comments:         Anesthesia Quick Evaluation  

## 2016-01-24 NOTE — Anesthesia Procedure Notes (Signed)
Procedure Name: LMA Insertion Date/Time: 01/24/2016 11:56 AM Performed by: Willeen Cass P Pre-anesthesia Checklist: Patient identified, Emergency Drugs available, Suction available, Patient being monitored and Timeout performed Patient Re-evaluated:Patient Re-evaluated prior to inductionOxygen Delivery Method: Circle system utilized Preoxygenation: Pre-oxygenation with 100% oxygen Intubation Type: IV induction Ventilation: Mask ventilation without difficulty LMA: LMA inserted LMA Size: 4.0 Number of attempts: 1 Placement Confirmation: positive ETCO2 and breath sounds checked- equal and bilateral Tube secured with: Tape Dental Injury: Teeth and Oropharynx as per pre-operative assessment

## 2016-01-24 NOTE — Op Note (Signed)
Operative Note   DATE OF OPERATION: 11.19.17  LOCATION: [facility]- observation  SURGICAL DIVISION: Plastic Surgery  PREOPERATIVE DIAGNOSES:  1. PALB2 mutation 2. Acquired absence bilateral breasts 3. Seroma right breast reconstruction  POSTOPERATIVE DIAGNOSES:  same  PROCEDURE:  Removal and replacement right tissue expander  SURGEON: [name] MD MBA  ASSISTANT: none  ANESTHESIA:  General.   EBL: 30 ml  COMPLICATIONS: None immediate.   INDICATIONS FOR PROCEDURE:  The patient, Edit, is a 55 y.o. female born on 1960-03-14, is here for washout right breast reconstruction for onset cellulitis and fever following drain removal. She underwent bilateral nipple sparing mastectomies with immediate expander based reconstruction three weeks ago.   FINDINGS: Milky fluid drained from right mastectomy cavity. All of acellular dermis removed. A new Natrelle 133FX-13-T 550 ml tissue expander placed, initial fill volume 390 ml. SN [id number]  DESCRIPTION OF PROCEDURE:  The patient's operative site was marked with the patient in the preoperative area. The patient was taken to the operating room. SCDs were placed and IV antibiotics were given. The patient's operative site was prepped and draped in a sterile fashion. A time out was performed and all information was confirmed to be correct. Incision made through prior inframammary fold scar and carried through mastectomy flap. Fluid in cavity sent for culture. Acellular dermis had not yet incorporated and was removed in its entirety. The expander was removed. Cavity treated with curettage. Cavity irrigated with saline solution containing Ancef, gentamicin, and bacitracin. 19 Fr drain placed in cavity and secured with 2-0 nylon. Cavity irrigated with Betadine prior to placement new tissue expander. Expander secured to chest with 3-0 monocryl. Closure completed with 3-0 monocryl in superficial fascia followed by 3-0 monocryl in dermis, 4-0  nylon for skin closure. Tissue adhesive and dry dressing, breast binder applied.  The patient was allowed to wake from anesthesia, extubated and taken to the recovery room in satisfactory condition.   SPECIMENS: right chest fluid for culture  DRAINS: 19 Fr JP in right breast reconstruction  [name], MD [facility] Plastic & Reconstructive Surgery [phone], pin [phone]

## 2016-01-25 ENCOUNTER — Encounter (HOSPITAL_COMMUNITY): Payer: Self-pay | Admitting: General Practice

## 2016-01-25 DIAGNOSIS — L03313 Cellulitis of chest wall: Secondary | ICD-10-CM | POA: Diagnosis not present

## 2016-01-25 MED ORDER — OXYCODONE HCL 5 MG PO TABS: 5.0000 mg | ORAL_TABLET | ORAL | 0 refills | Status: DC | PRN

## 2016-01-25 MED ORDER — METHOCARBAMOL 500 MG PO TABS: 500.0000 mg | ORAL_TABLET | Freq: Three times a day (TID) | ORAL | 0 refills | Status: DC | PRN

## 2016-01-25 NOTE — Progress Notes (Signed)
Alejandra Harmon to be D/C'd  per MD order. Discussed with the patient and all questions fully answered.  VSS, Skin clean, dry and intact without evidence of skin break down, no evidence of skin tears noted.  IV catheter discontinued intact. Site without signs and symptoms of complications. Dressing and pressure applied.  An After Visit Summary was printed and given to the patient. Patient received prescription.  D/c education completed with patient/family including follow up instructions, medication list, d/c activities limitations if indicated, with other d/c instructions as indicated by MD - patient able to verbalize understanding, all questions fully answered.   Patient instructed to return to ED, call 911, or call MD for any changes in condition.   Patient to be escorted via [place], and D/C home via private auto.

## 2016-01-25 NOTE — Discharge Summary (Signed)
Physician Discharge Summary  Patient ID: Alejandra Harmon MRN: [id number] DOB/AGE: Feb 04, 1961 55 y.o.  Admit date: 01/24/2016 Discharge date: 01/25/2016  Admission Diagnoses: Acquired absence breasts, PALB2 mutation, seroma right chest  Discharge Diagnoses:  Active Problems:   Acquired absence of breast and nipple   Discharged Condition: stable  Hospital Course: Patient admitted for cellulitis right reconstruction. Intraoperatively fluid obtained for culture, GPC presently, culture pending. Postoperatively did well with resolution cellulitis and pain. Plan to call patient with culture results and adjust antibiotics as needed. Has scheduled follow up appt.   Treatments: surgery: removal replacement right tissue expander  Discharge Exam: Blood pressure 104/61, pulse 70, temperature 98 F (36.7 C), temperature source Oral, resp. rate 19, height '5\' 2"'$  (1.575 m), weight 88.9 kg (196 lb), last menstrual period 02/10/2015, SpO2 98 %. Incision/Wound:  Dry intact with resolved cellulitis, drain watery serosanguinous  Disposition: 01-Home or Self Care  Discharge Instructions    Call MD for:  redness, tenderness, or signs of infection (pain, swelling, bleeding, redness, odor or green/yellow discharge around incision site)    Complete by:  As directed    Call MD for:  temperature >100.5    Complete by:  As directed    Discharge instructions    Complete by:  As directed    Ok to remove dressings and shower am 11.21.17. Soap and water ok, pat incisions dry. No creams or ointments over incisions. Do not let drain dangle in shower, attach to lanyard or similar.Strip and record drain twice daily and bring log to clinic visit.  Breast binder or soft compression bra all other times.  Ok to raise arms above shoulders for bathing and dressing.  No house yard work or exercise until cleared by MD.   Driving Restrictions    Complete by:  As directed    No driving if taking narcotics   Lifting  restrictions    Complete by:  As directed    No lifting > 5 lbs   Resume previous diet    Complete by:  As directed        Medication List    STOP taking these medications   doxycycline 50 MG capsule Commonly known as:  VIBRAMYCIN     TAKE these medications   acetaminophen 500 MG chewable tablet Commonly known as:  TYLENOL Chew 1,000 mg by mouth every 4 (four) hours as needed for pain.   ciprofloxacin 500 MG tablet Commonly known as:  CIPRO Take 500 mg by mouth 2 (two) times daily.   ibuprofen 600 MG tablet Commonly known as:  ADVIL,MOTRIN Take 1 tablet (600 mg total) by mouth every 6 (six) hours as needed (mild pain).   methocarbamol 500 MG tablet Commonly known as:  ROBAXIN Take 1 tablet (500 mg total) by mouth every 8 (eight) hours as needed for muscle spasms.   ondansetron 4 MG disintegrating tablet Commonly known as:  ZOFRAN-ODT Take 4 mg by mouth every 8 (eight) hours as needed for nausea or vomiting.   oxyCODONE 5 MG immediate release tablet Commonly known as:  Oxy IR/ROXICODONE Take 1-2 tablets (5-10 mg total) by mouth every 4 (four) hours as needed for moderate pain.   TRANSDERM-SCOP (1.5 MG) 1 MG/3DAYS Generic drug:  scopolamine Place 1 patch onto the skin every 3 (three) days.      Follow-up Information    Renesmay Nesbitt, MD In 1 week.   Specialty:  Plastic Surgery Why:  as scheduled Contact information: [place]  [address] [place] [address] [phone]           Signed: [name] 01/25/2016, 7:01 AM

## 2016-01-29 LAB — AEROBIC/ANAEROBIC CULTURE W GRAM STAIN (SURGICAL/DEEP WOUND)

## 2016-01-29 LAB — AEROBIC/ANAEROBIC CULTURE (SURGICAL/DEEP WOUND)

## 2016-02-06 ENCOUNTER — Emergency Department (HOSPITAL_COMMUNITY)
Admission: EM | Admit: 2016-02-06 | Discharge: 2016-02-07 | Disposition: A | Discharge status: Home / Self Care | Payer: BLUE CROSS/BLUE SHIELD | Attending: Emergency Medicine | Admitting: Emergency Medicine

## 2016-02-06 ENCOUNTER — Emergency Department (HOSPITAL_COMMUNITY): Payer: BLUE CROSS/BLUE SHIELD

## 2016-02-06 ENCOUNTER — Encounter (HOSPITAL_COMMUNITY): Payer: Self-pay | Admitting: *Deleted

## 2016-02-06 DIAGNOSIS — Z8673 Personal history of transient ischemic attack (TIA), and cerebral infarction without residual deficits: Secondary | ICD-10-CM | POA: Diagnosis not present

## 2016-02-06 DIAGNOSIS — R609 Edema, unspecified: Secondary | ICD-10-CM

## 2016-02-06 DIAGNOSIS — R600 Localized edema: Secondary | ICD-10-CM | POA: Diagnosis not present

## 2016-02-06 DIAGNOSIS — R0600 Dyspnea, unspecified: Secondary | ICD-10-CM | POA: Diagnosis not present

## 2016-02-06 DIAGNOSIS — R0602 Shortness of breath: Secondary | ICD-10-CM | POA: Diagnosis present

## 2016-02-06 LAB — BASIC METABOLIC PANEL
ANION GAP: 10 (ref 5–15)
BUN: 9 mg/dL (ref 6–20)
CALCIUM: 9 mg/dL (ref 8.9–10.3)
CO2: 22 mmol/L (ref 22–32)
Chloride: 107 mmol/L (ref 101–111)
Creatinine, Ser: 0.76 mg/dL (ref 0.44–1.00)
Glucose, Bld: 121 mg/dL — ABNORMAL HIGH (ref 65–99)
POTASSIUM: 3.4 mmol/L — AB (ref 3.5–5.1)
SODIUM: 139 mmol/L (ref 135–145)

## 2016-02-06 LAB — CBC WITH DIFFERENTIAL/PLATELET
BASOS ABS: 0 10*3/uL (ref 0.0–0.1)
BASOS PCT: 0 %
EOS ABS: 0.3 10*3/uL (ref 0.0–0.7)
EOS PCT: 3 %
HCT: 32.5 % — ABNORMAL LOW (ref 36.0–46.0)
Hemoglobin: 10.5 g/dL — ABNORMAL LOW (ref 12.0–15.0)
LYMPHS PCT: 25 %
Lymphs Abs: 1.9 10*3/uL (ref 0.7–4.0)
MCH: 26.9 pg (ref 26.0–34.0)
MCHC: 32.3 g/dL (ref 30.0–36.0)
MCV: 83.3 fL (ref 78.0–100.0)
Monocytes Absolute: 0.6 10*3/uL (ref 0.1–1.0)
Monocytes Relative: 7 %
Neutro Abs: 4.9 10*3/uL (ref 1.7–7.7)
Neutrophils Relative %: 65 %
PLATELETS: 349 10*3/uL (ref 150–400)
RBC: 3.9 MIL/uL (ref 3.87–5.11)
RDW: 14.2 % (ref 11.5–15.5)
WBC: 7.6 10*3/uL (ref 4.0–10.5)

## 2016-02-06 LAB — URINALYSIS, ROUTINE W REFLEX MICROSCOPIC
BILIRUBIN URINE: NEGATIVE
Glucose, UA: NEGATIVE mg/dL
Hgb urine dipstick: NEGATIVE
KETONES UR: NEGATIVE mg/dL
LEUKOCYTES UA: NEGATIVE
NITRITE: NEGATIVE
PROTEIN: NEGATIVE mg/dL
Specific Gravity, Urine: 1.027 (ref 1.005–1.030)
pH: 6.5 (ref 5.0–8.0)

## 2016-02-06 LAB — BRAIN NATRIURETIC PEPTIDE: B NATRIURETIC PEPTIDE 5: 48.4 pg/mL (ref 0.0–100.0)

## 2016-02-06 MED ORDER — IOPAMIDOL (ISOVUE-370) INJECTION 76%
INTRAVENOUS | Status: AC
  Administered 2016-02-06: 100 mL
  Filled 2016-02-06: qty 100

## 2016-02-06 NOTE — ED Triage Notes (Signed)
Patient presents with c/o pain to the center of her chest.  Hurts the same whether she is pushing there or not.  Had surgery in November and her MD is worried about a possible blood clot.  Upper legs swell but has noticed her lower legs swelling.  Feels SOB but sats are 100% on RA

## 2016-02-06 NOTE — ED Notes (Signed)
ED Provider at bedside. 

## 2016-02-06 NOTE — ED Notes (Signed)
Pt placed on 2lpm O2 by n/c.

## 2016-02-06 NOTE — ED Provider Notes (Signed)
Emergency Department Provider Note   I have reviewed the triage vital signs and the nursing notes.   HISTORY  Chief Complaint Shortness of Breath and Leg Swelling   HPI Alejandra Harmon is a 55 y.o. female with PMH of recent mastectomy 11/19 presents to the emergency department for evaluation of bilateral lower extremity edema and dyspnea. Patient has no prior history of similar symptoms. She does not have breast cancer but was brought, positive and opted for elective mastectomy and reconstruction. Symptoms began 2 days prior with associated mild substernal chest discomfort. No fevers or chills. No productive cough. The patient has a JP drain in place the right chest wall he continues to have good output. She does report some mild dysuria and hesitancy.   Past Medical History:  Diagnosis Date  . Allergic rhinitis   . Cyst of ovary   . History of esophageal reflux   . Mild sleep apnea    STUDY 07-27-2009-- POSITIONAL / REM AFFECT---  RECOMMENDATION LOOSE WT.  Marland Kitchen Pelvic pain in female   . Right thyroid nodule   . Sciatica of left side   . Wears contact lenses     Patient Active Problem List   Diagnosis Date Noted  . Acquired absence of breast and nipple 01/24/2016  . At high risk for breast cancer 01/05/2016  . 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 Endometriosis 02/26/2015  . Cough 11/03/2014  . Genetic testing 09/23/2014  . Obesity 06/19/2014  . Hoarseness 10/03/2013  . Left knee pain 07/03/2013  . Breast pain in female 01/22/2013  . Breast lump on right side at 8 o'clock position 04/24/2012  . TIA (transient ischemic attack) 12/17/2010  . Impaired glucose tolerance 12/17/2010  . Preventative health care 06/02/2010  . GERD 08/31/2009  . OBSTRUCTIVE SLEEP APNEA 08/19/2009  . BREAST HYPERTROPHY 10/30/2008  . BACK PAIN 10/30/2008  . ANXIETY 07/03/2007  . Depression 07/03/2007  . ALLERGIC RHINITIS 07/03/2007    Past Surgical History:  Procedure Laterality  Date  . BREAST ENHANCEMENT SURGERY Bilateral 12-16-2008  . BREAST RECONSTRUCTION WITH PLACEMENT OF TISSUE EXPANDER AND FLEX HD (ACELLULAR HYDRATED DERMIS) Bilateral 01/05/2016  . BREAST RECONSTRUCTION WITH PLACEMENT OF TISSUE EXPANDER AND FLEX HD (ACELLULAR HYDRATED DERMIS) Bilateral 01/05/2016   Procedure: BILATERAL BREAST RECONSTRUCTION WITH PLACEMENT OF TISSUE EXPANDERS AND CORTIVA TISSUE;  Surgeon: [name], MD;  Location: [facility];  Service: Plastics;  Laterality: Bilateral;  . CESAREAN SECTION  1994  . COLONOSCOPY  last one 12-23-2013  . ELBOW ULNAR NERVE DECOMPRESSION Right 05-13-2003  . EXCISION BENIGN BREAST CYST  1980  . LAPAROSCOPIC ASSISTED VAGINAL HYSTERECTOMY N/A 02/26/2015   Procedure: LAPAROSCOPIC ASSISTED VAGINAL HYSTERECTOMY ;  Surgeon: Everlene Farrier, MD;  Location: [place] [facility];  Service: Gynecology;  Laterality: N/A;  . LAPAROSCOPIC BILATERAL SALPINGO OOPHERECTOMY Bilateral 02/26/2015   Procedure: LAPAROSCOPIC BILATERAL SALPINGO OOPHORECTOMY;  Surgeon: Everlene Farrier, MD;  Location: [facility];  Service: Gynecology;  Laterality: Bilateral;  . LAPAROSCOPIC CHOLECYSTECTOMY  1998  . LAPAROSCOPY/  LYSIS ADHESIONS/  RIGHT OVARY CYSTECTOMY/  ALBATION ENDOMETRIOSIS/  CHROMOPERTUBATION  02-18-2004  . MASTECTOMY Bilateral 01/05/2016   PROPHYLATIC NIPPLE SPARING MASTECTOMY   . NIPPLE SPARING MASTECTOMY Bilateral 01/05/2016   Procedure: BILATERAL PROPHYLATIC NIPPLE SPARING MASTECTOMIES;  Surgeon: Rolm Bookbinder, MD;  Location: [place];  Service: General;  Laterality: Bilateral;  . TISSUE EXPANDER PLACEMENT Right 01/24/2016   Procedure: REMOVAL AND PLACEMENT OF TISSUE EXPANDER/RIGHT;  Surgeon: [name], MD;  Location: [facility]  OR;  Service: Clinical cytogeneticist;  Laterality: Right;  . TISSUE EXPANDER REMOVAL Right 01/24/2016  . TRANSTHORACIC ECHOCARDIOGRAM  07-07-2009   grade 1 diastolic dysfunction, ef 96-04%/  mild AR and TR/  trivial MR    Current  Outpatient Rx  . Order #: [id number] Class: Print  . Order #: [id number] Class: Normal  . Order #: [id number] Class: Print    Allergies Apple; Soy allergy; and Bactrim [sulfamethoxazole-trimethoprim]  Family History  Problem Relation Age of Onset  . Diabetes Mother   . Hypertension Mother   . Breast cancer Mother 18  . Renal cancer Mother 16  . Breast cancer Sister 63  . Diabetes Brother   . Leukemia Brother   . Diabetes Brother   . Prostate cancer Brother 59  . Diabetes Brother   . Prostate cancer Brother 85  . Diabetes Brother   . Diabetes Brother   . Diabetes Brother   . Diabetes Sister   . Diabetes Sister   . Diabetes Sister   . Breast cancer Sister 34    PALB2+  . Bone cancer Maternal Aunt   . Prostate cancer Maternal Uncle   . Prostate cancer Paternal Uncle   . Breast cancer Maternal Aunt 8  . Diabetes Other   . Hypertension Other   . Cancer Other     ovarian cancer  . Colon cancer Neg Hx     Social History Social History  Substance Use Topics  . Smoking status: Never Smoker  . Smokeless tobacco: Never Used  . Alcohol use No    Review of Systems  Constitutional: No fever/chills Eyes: No visual changes. ENT: No sore throat. Cardiovascular: Positive chest pain. Positive LE edema.  Respiratory: Positive shortness of breath. Gastrointestinal: No abdominal pain.  No nausea, no vomiting.  No diarrhea.  No constipation. Genitourinary: Negative for dysuria. Musculoskeletal: Negative for back pain. Skin: Negative for rash. Neurological: Negative for headaches, focal weakness or numbness.  10-point ROS otherwise negative.  ____________________________________________   PHYSICAL EXAM:  VITAL SIGNS: ED Triage Vitals  Enc Vitals Group     BP 02/06/16 1934 146/80     Pulse Rate 02/06/16 1934 69     Resp 02/06/16 1934 20     Temp 02/06/16 1934 97.3 F (36.3 C)     Temp Source 02/06/16 1934 Oral     SpO2 02/06/16 1934 99 %     Weight 02/06/16 1934 192  lb (87.1 kg)     Height 02/06/16 1934 '5\' 2"'$  (1.575 m)     Pain Score 02/06/16 1958 2   Constitutional: Alert and oriented. Well appearing and in no acute distress. Eyes: Conjunctivae are normal.  Head: Atraumatic. Nose: No congestion/rhinnorhea. Mouth/Throat: Mucous membranes are moist.  Oropharynx non-erythematous. Neck: No stridor.   Cardiovascular: Normal rate, regular rhythm. Good peripheral circulation. Grossly normal heart sounds.   Respiratory: Normal respiratory effort.  No retractions. Lungs CTAB. Gastrointestinal: Soft and nontender. No distention.  Musculoskeletal: No lower extremity tenderness. Trace bilateral pitting edema bilaterally. No gross deformities of extremities. Neurologic:  Normal speech and language. No gross focal neurologic deficits are appreciated.  Skin:  Skin is warm, dry and intact. No rash noted. JP drain from well-healing incision over right lateral breast.   ____________________________________________   LABS (all labs ordered are listed, but only abnormal results are displayed)  Labs Reviewed  CBC WITH DIFFERENTIAL/PLATELET - Abnormal; Notable for the following:       Result Value   Hemoglobin 10.5 (*)    HCT 32.5 (*)  All other components within normal limits  BASIC METABOLIC PANEL - Abnormal; Notable for the following:    Potassium 3.4 (*)    Glucose, Bld 121 (*)    All other components within normal limits  BRAIN NATRIURETIC PEPTIDE  URINALYSIS, ROUTINE W REFLEX MICROSCOPIC (NOT AT [facility])   ____________________________________________  EKG   EKG Interpretation  Date/Time:  Saturday February 06 2016 19:40:49 EST Ventricular Rate:  70 PR Interval:    QRS Duration: 68 QT Interval:  342 QTC Calculation: 369 R Axis:   80 Text Interpretation:  sinus rhythm Low voltage QRS Cannot rule out Anterior infarct , age undetermined No significant change since last tracing Confirmed by KNAPP  MD-J, JON [phone]) on 02/07/2016 8:54:56 AM        ____________________________________________  RADIOLOGY  Dg Chest 2 View  Result Date: 02/06/2016 CLINICAL DATA:  Recent surgery with bilateral mastectomy and expanders 01/24/2016. Mid chest pain and bilateral leg swelling. EXAM: CHEST  2 VIEW COMPARISON:  Two-view chest x-ray a 09/25/2013 FINDINGS: Heart size is normal. Mild pulmonary vascular congestion is present. Minimal right basilar atelectasis is noted. No other focal airspace disease is present. Soft tissue expanders are noted within the breasts bilaterally. Surgical clips are present at the gallbladder fossa. IMPRESSION: 1. Mild pulmonary vascular congestion without frank edema. 2. Minimal right basilar atelectasis. Electronically Signed   By: San Morelle M.D.   On: 02/06/2016 20:55   Ct Angio Chest Pe W And/or Wo Contrast  Result Date: 02/06/2016 CLINICAL DATA:  Chest pain and dyspnea in post-op setting. Hx mastectomy. EXAM: CT ANGIOGRAPHY CHEST WITH CONTRAST TECHNIQUE: Multidetector CT imaging of the chest was performed using the standard protocol during bolus administration of intravenous contrast. Multiplanar CT image reconstructions and MIPs were obtained to evaluate the vascular anatomy. CONTRAST:  60 mL of Isovue 370 intravenous contrast COMPARISON:  Current chest radiographs FINDINGS: Cardiovascular: Satisfactory opacification of the pulmonary arteries to the segmental level. No evidence of pulmonary embolism. Normal heart size. No pericardial effusion. No coronary artery calcifications. Great vessels are normal in caliber. No aortic dissection or plaque. Mediastinum/Nodes: No enlarged mediastinal, hilar, or axillary lymph nodes. Thyroid gland, trachea, and esophagus demonstrate no significant findings. Lungs/Pleura: Minor dependent subsegmental atelectasis. No evidence of pneumonia or pulmonary edema. No pleural effusion or pneumothorax. Upper Abdomen: No acute abnormality. Soft tissues: Bilateral breast tissue expanders  are noted. There is overlying skin edema, findings consistent with recent bilateral mastectomies. Musculoskeletal: No acute findings. No osteoblastic or osteolytic lesions. Review of the MIP images confirms the above findings. IMPRESSION: 1. No evidence of a pulmonary embolus. 2. No acute findings. Electronically Signed   By: Lajean Manes M.D.   On: 02/06/2016 22:27    ____________________________________________   PROCEDURES  Procedure(s) performed:   Procedures  None ____________________________________________   INITIAL IMPRESSION / ASSESSMENT AND PLAN / ED COURSE  Pertinent labs & imaging results that were available during my care of the patient were reviewed by me and considered in my medical decision making (see chart for details).  Patient presents to the ED for evaluation of B/L LE edema and dyspnea with mild, persistent chest pain. CP is worse with deep breathing. Patient had a recent mastectomy for prevention with high risk genetic profile. JP drain in place which continues to drain. Patient in no acute distress. No history of CKD or CHF. Suspicion for PE must remain high with above symptoms in the post-op setting. Plan for labs including BNP along with CTA chest. CXR  shows mild vascular congestion without frank edema.   22:30  CTA negative for PE. BNP not elevated. With only trace edema, normal WOB, and no hypoxemia I plan for discharge to PCP on Monday for continued outpatient w/u and ECHO PRN. Discussed my impression, plan, and return precautions in detail.   At this time, I do not feel there is any life-threatening condition present. I have reviewed and discussed all results (EKG, imaging, lab, urine as appropriate), exam findings with patient. I have reviewed nursing notes and appropriate previous records.  I feel the patient is safe to be discharged home without further emergent workup. Discussed usual and customary return precautions. Patient and family (if present)  verbalize understanding and are comfortable with this plan.  Patient will follow-up with their primary care provider. If they do not have a primary care provider, information for follow-up has been provided to them. All questions have been answered.   ____________________________________________  FINAL CLINICAL IMPRESSION(S) / ED DIAGNOSES  Final diagnoses:  Peripheral edema  Dyspnea, unspecified type     MEDICATIONS GIVEN DURING THIS VISIT:  Medications  iopamidol (ISOVUE-370) 76 % injection (100 mLs  Contrast Given 02/06/16 2213)     NEW OUTPATIENT MEDICATIONS STARTED DURING THIS VISIT:  None   Note:  This document was prepared using Dragon voice recognition software and may include unintentional dictation errors.  Nanda Quinton, MD Emergency Medicine   Margette Fast, MD 02/07/16 [phone]

## 2016-02-06 NOTE — ED Notes (Signed)
Patient transported to CT 

## 2016-02-07 NOTE — Discharge Instructions (Signed)
You were seen in the ED today with difficulty breathing and lower leg swelling. Keep the legs wrapped and elevated. Follow up with your PCP on Monday for a re-check of your labs.   Return to the ED immediately with any new or worsening difficulty breathing, chest pain, fever, or chills.

## 2016-02-07 NOTE — ED Notes (Signed)
Pt departed in NAD.  

## 2016-02-19 ENCOUNTER — Ambulatory Visit (INDEPENDENT_AMBULATORY_CARE_PROVIDER_SITE_OTHER): Payer: BLUE CROSS/BLUE SHIELD | Admitting: Internal Medicine

## 2016-02-19 ENCOUNTER — Encounter: Payer: Self-pay | Admitting: Internal Medicine

## 2016-02-19 VITALS — BP 140/82 | HR 76 | Temp 98.4°F | Resp 20 | Wt 197.0 lb

## 2016-02-19 DIAGNOSIS — R7302 Impaired glucose tolerance (oral): Secondary | ICD-10-CM

## 2016-02-19 DIAGNOSIS — D649 Anemia, unspecified: Secondary | ICD-10-CM

## 2016-02-19 DIAGNOSIS — I10 Essential (primary) hypertension: Secondary | ICD-10-CM | POA: Insufficient documentation

## 2016-02-19 DIAGNOSIS — Z0001 Encounter for general adult medical examination with abnormal findings: Secondary | ICD-10-CM | POA: Diagnosis not present

## 2016-02-19 DIAGNOSIS — R030 Elevated blood-pressure reading, without diagnosis of hypertension: Secondary | ICD-10-CM | POA: Insufficient documentation

## 2016-02-19 DIAGNOSIS — E8809 Other disorders of plasma-protein metabolism, not elsewhere classified: Secondary | ICD-10-CM | POA: Insufficient documentation

## 2016-02-19 DIAGNOSIS — R779 Abnormality of plasma protein, unspecified: Secondary | ICD-10-CM | POA: Insufficient documentation

## 2016-02-19 MED ORDER — HYDROCHLOROTHIAZIDE 12.5 MG PO CAPS: 12.5000 mg | ORAL_CAPSULE | Freq: Every day | ORAL | 11 refills | Status: DC

## 2016-02-19 NOTE — Progress Notes (Signed)
Subjective:    Patient ID: Alejandra Harmon, female    DOB: [birth year], 55 y.o.   MRN: [id number]  HPI   Here to f/u; Has recently been seen ED with dyspnea, leg swelling post surgury now improved.  S/p CTA chest dec 2 - no PNA or pulm edema, different from the cxr same day.  BP's have been 171/88, and 160/85 at [employer] recently, though not sure how accurate.  Pt denies chest pain, increased sob or doe, wheezing, orthopnea, PND, increased LE swelling, palpitations, dizziness or syncope. Pt denies new neurological symptoms such as new headache, or facial or extremity weakness or numbness   Pt denies polydipsia, polyuria, S/p bilat mastectomy complicated by right skin infection now improved  Still has implant surgury to be done  Wt overall stable BP Readings from Last 3 Encounters:  02/19/16 140/82  02/06/16 134/79  01/25/16 112/63   Wt Readings from Last 3 Encounters:  02/19/16 197 lb (89.4 kg)  02/06/16 192 lb (87.1 kg)  01/24/16 196 lb (88.9 kg)   Past Medical History:  Diagnosis Date  . Allergic rhinitis   . Cyst of ovary   . History of esophageal reflux   . Mild sleep apnea    STUDY 07-27-2009-- POSITIONAL / REM AFFECT---  RECOMMENDATION LOOSE WT.  Marland Kitchen Pelvic pain in female   . Right thyroid nodule   . Sciatica of left side   . Wears contact lenses    Past Surgical History:  Procedure Laterality Date  . BREAST ENHANCEMENT SURGERY Bilateral 12-16-2008  . BREAST RECONSTRUCTION WITH PLACEMENT OF TISSUE EXPANDER AND FLEX HD (ACELLULAR HYDRATED DERMIS) Bilateral 01/05/2016  . BREAST RECONSTRUCTION WITH PLACEMENT OF TISSUE EXPANDER AND FLEX HD (ACELLULAR HYDRATED DERMIS) Bilateral 01/05/2016   Procedure: BILATERAL BREAST RECONSTRUCTION WITH PLACEMENT OF TISSUE EXPANDERS AND CORTIVA TISSUE;  Surgeon: [name], MD;  Location: [place];  Service: Plastics;  Laterality: Bilateral;  . CESAREAN SECTION  1994  . COLONOSCOPY  last one 12-23-2013  . ELBOW ULNAR NERVE DECOMPRESSION  Right 05-13-2003  . EXCISION BENIGN BREAST CYST  1980  . LAPAROSCOPIC ASSISTED VAGINAL HYSTERECTOMY N/A 02/26/2015   Procedure: LAPAROSCOPIC ASSISTED VAGINAL HYSTERECTOMY ;  Surgeon: Everlene Farrier, MD;  Location: [place];  Service: Gynecology;  Laterality: N/A;  . LAPAROSCOPIC BILATERAL SALPINGO OOPHERECTOMY Bilateral 02/26/2015   Procedure: LAPAROSCOPIC BILATERAL SALPINGO OOPHORECTOMY;  Surgeon: Everlene Farrier, MD;  Location: [place];  Service: Gynecology;  Laterality: Bilateral;  . LAPAROSCOPIC CHOLECYSTECTOMY  1998  . LAPAROSCOPY/  LYSIS ADHESIONS/  RIGHT OVARY CYSTECTOMY/  ALBATION ENDOMETRIOSIS/  CHROMOPERTUBATION  02-18-2004  . MASTECTOMY Bilateral 01/05/2016   PROPHYLATIC NIPPLE SPARING MASTECTOMY   . NIPPLE SPARING MASTECTOMY Bilateral 01/05/2016   Procedure: BILATERAL PROPHYLATIC NIPPLE SPARING MASTECTOMIES;  Surgeon: Rolm Bookbinder, MD;  Location: [place];  Service: General;  Laterality: Bilateral;  . TISSUE EXPANDER PLACEMENT Right 01/24/2016   Procedure: REMOVAL AND PLACEMENT OF TISSUE EXPANDER/RIGHT;  Surgeon: [name], MD;  Location: [place];  Service: Plastics;  Laterality: Right;  . TISSUE EXPANDER REMOVAL Right 01/24/2016  . TRANSTHORACIC ECHOCARDIOGRAM  07-07-2009   grade 1 diastolic dysfunction, ef [id number]  mild AR and TR/  trivial MR    reports that she has never smoked. She has never used smokeless tobacco. She reports that she does not drink alcohol or use drugs. family history includes Bone cancer in her maternal aunt; Breast cancer (age of onset: 65) in her sister; Breast cancer (age of onset: 50) in  her maternal aunt; Breast cancer (age of onset: 13) in her sister; Breast cancer (age of onset: 81) in her mother; Cancer in her other; Diabetes in her brother, brother, brother, brother, brother, brother, mother, other, sister, sister, and sister; Hypertension in her mother and other; Leukemia in her brother; Prostate cancer in her  maternal uncle and paternal uncle; Prostate cancer (age of onset: 13) in her brother; Prostate cancer (age of onset: 72) in her brother; Renal cancer (age of onset: 35) in her mother. Allergies  Allergen Reactions  . Apple Anaphylaxis and Other (See Comments)    Raw apples, can eat baked  . Soy Allergy Other (See Comments)    Indegestion  . Bactrim [Sulfamethoxazole-Trimethoprim] Hives   Current Outpatient Prescriptions on File Prior to Visit  Medication Sig Dispense Refill  . ibuprofen (ADVIL,MOTRIN) 600 MG tablet Take 1 tablet (600 mg total) by mouth every 6 (six) hours as needed (mild pain). 30 tablet 0  . methocarbamol (ROBAXIN) 500 MG tablet Take 1 tablet (500 mg total) by mouth every 8 (eight) hours as needed for muscle spasms. 30 tablet 0   No current facility-administered medications on file prior to visit.    Review of Systems  Constitutional: Negative for unusual diaphoresis or night sweats HENT: Negative for ear swelling or discharge Eyes: Negative for worsening visual haziness  Respiratory: Negative for choking and stridor.   Gastrointestinal: Negative for distension or worsening eructation Genitourinary: Negative for retention or change in urine volume.  Musculoskeletal: Negative for other MSK pain or swelling Skin: Negative for color change and worsening wound Neurological: Negative for tremors and numbness other than noted  Psychiatric/Behavioral: Negative for decreased concentration or agitation other than above   All other system neg per pt    Objective:   Physical Exam  BP 140/82   Pulse 76   Temp 98.4 F (36.9 C) (Oral)   Resp 20   Wt 197 lb (89.4 kg)   LMP 02/10/2015 (Exact Date)   SpO2 98%   BMI 36.03 kg/m  VS noted,  Constitutional: Pt appears in no apparent distress HENT: Head: NCAT.  Right Ear: External ear normal.  Left Ear: External ear normal.  Eyes: . Pupils are equal, round, and reactive to light. Conjunctivae and EOM are normal Neck: Normal  range of motion. Neck supple.  Cardiovascular: Normal rate and regular rhythm.   Pulmonary/Chest: Effort normal and breath sounds without rales or wheezing.  Neurological: Pt is alert. Not confused , motor grossly intact Skin: Skin is warm. No rash, no LE edema Psychiatric: Pt behavior is normal. No agitation.  No other new exam findings     Assessment & Plan:

## 2016-02-19 NOTE — Progress Notes (Signed)
Pre visit review using our clinic review tool, if applicable. No additional management support is needed unless otherwise documented below in the visit note. 

## 2016-02-19 NOTE — Patient Instructions (Addendum)
Please take all new medication as prescribed - the mild fluid pill for blood pressure  Please continue all other medications as before, and refills have been done if requested.  Please have the pharmacy call with any other refills you may need.  Please keep your appointments with your specialists as you may have planned  Good Luck with your next surgury  Please return in 6 months, or sooner if needed, with Lab testing done 3-5 days before

## 2016-02-19 NOTE — Assessment & Plan Note (Signed)
Lab Results  Component Value Date   WBC 7.6 02/06/2016   HGB 10.5 (L) 02/06/2016   HCT 32.5 (L) 02/06/2016   MCV 83.3 02/06/2016   PLT 349 02/06/2016   No recent bleeding, declines f/u today, overall mild postop

## 2016-02-19 NOTE — Assessment & Plan Note (Signed)
stable overall by history and exam, recent data reviewed with pt, and pt to continue medical treatment as before,  to f/u any worsening symptoms or concerns Lab Results  Component Value Date   HGBA1C 6.4 06/19/2014

## 2016-02-19 NOTE — Assessment & Plan Note (Addendum)
?   Reactive vs essential, for hct 12.5 qd, may not need long term, cont to monitor at home and next visit

## 2016-02-25 ENCOUNTER — Ambulatory Visit: Payer: BLUE CROSS/BLUE SHIELD | Admitting: Physical Therapy

## 2016-02-26 ENCOUNTER — Ambulatory Visit: Payer: BLUE CROSS/BLUE SHIELD | Attending: Plastic Surgery | Admitting: Physical Therapy

## 2016-02-26 DIAGNOSIS — M25612 Stiffness of left shoulder, not elsewhere classified: Secondary | ICD-10-CM | POA: Insufficient documentation

## 2016-02-26 DIAGNOSIS — M25611 Stiffness of right shoulder, not elsewhere classified: Secondary | ICD-10-CM | POA: Diagnosis not present

## 2016-02-26 NOTE — Patient Instructions (Signed)
Cane Exercise: Abduction    Hold cane with right hand over end, palm-up, with other hand palm-down. Move arm out from side and up by pushing with other arm. Hold __5__ seconds. Repeat __5__ times. Do __2-3__ sessions per day.  http://gt2.exer.us/82   Copyright  VHI. All rights reserved.   Do the same as pictured above, but moving the arm forward, in front of the shoulder, instead of out to the side.  CHEST: Doorway, Bilateral - Standing    Standing in doorway, place hands on wall with elbows bent at shoulder height. Lean forward. Hold _15__ seconds. _3__ reps per set, __2-3_ sets per day, _7__ days per week  Copyright  VHI. All rights reserved.

## 2016-02-26 NOTE — Therapy (Addendum)
[place], [place], [address] Phone: [phone]   Fax:  [phone]  Physical Therapy Evaluation  Patient Details  Name: Alejandra Harmon MRN: [id number] Date of Birth: Jun 16, 1960 Referring Provider: Dr. [name]  Encounter Date: 02/26/2016      PT End of Session - 02/26/16 1222    Visit Number 1   Number of Visits 9   Date for PT Re-Evaluation 04/06/16   PT Start Time 0940   PT Stop Time 1020   PT Time Calculation (min) 40 min   Activity Tolerance Patient tolerated treatment well   Behavior During Therapy [facility] for tasks assessed/performed      Past Medical History:  Diagnosis Date  . Allergic rhinitis   . Cyst of ovary   . History of esophageal reflux   . Mild sleep apnea    STUDY 07-27-2009-- POSITIONAL / REM AFFECT---  RECOMMENDATION LOOSE WT.  Marland Kitchen Pelvic pain in female   . Right thyroid nodule   . Sciatica of left side   . Wears contact lenses     Past Surgical History:  Procedure Laterality Date  . BREAST ENHANCEMENT SURGERY Bilateral 12-16-2008  . BREAST RECONSTRUCTION WITH PLACEMENT OF TISSUE EXPANDER AND FLEX HD (ACELLULAR HYDRATED DERMIS) Bilateral 01/05/2016  . BREAST RECONSTRUCTION WITH PLACEMENT OF TISSUE EXPANDER AND FLEX HD (ACELLULAR HYDRATED DERMIS) Bilateral 01/05/2016   Procedure: BILATERAL BREAST RECONSTRUCTION WITH PLACEMENT OF TISSUE EXPANDERS AND CORTIVA TISSUE;  Surgeon: [name], MD;  Location: [place];  Service: Plastics;  Laterality: Bilateral;  . CESAREAN SECTION  1994  . COLONOSCOPY  last one 12-23-2013  . ELBOW ULNAR NERVE DECOMPRESSION Right 05-13-2003  . EXCISION BENIGN BREAST CYST  1980  . LAPAROSCOPIC ASSISTED VAGINAL HYSTERECTOMY N/A 02/26/2015   Procedure: LAPAROSCOPIC ASSISTED VAGINAL HYSTERECTOMY ;  Surgeon: Everlene Farrier, MD;  Location: [place];  Service: Gynecology;  Laterality: N/A;  . LAPAROSCOPIC BILATERAL SALPINGO  OOPHERECTOMY Bilateral 02/26/2015   Procedure: LAPAROSCOPIC BILATERAL SALPINGO OOPHORECTOMY;  Surgeon: Everlene Farrier, MD;  Location: [place];  Service: Gynecology;  Laterality: Bilateral;  . LAPAROSCOPIC CHOLECYSTECTOMY  1998  . LAPAROSCOPY/  LYSIS ADHESIONS/  RIGHT OVARY CYSTECTOMY/  ALBATION ENDOMETRIOSIS/  CHROMOPERTUBATION  02-18-2004  . MASTECTOMY Bilateral 01/05/2016   PROPHYLATIC NIPPLE SPARING MASTECTOMY   . NIPPLE SPARING MASTECTOMY Bilateral 01/05/2016   Procedure: BILATERAL PROPHYLATIC NIPPLE SPARING MASTECTOMIES;  Surgeon: Rolm Bookbinder, MD;  Location: [place];  Service: General;  Laterality: Bilateral;  . TISSUE EXPANDER PLACEMENT Right 01/24/2016   Procedure: REMOVAL AND PLACEMENT OF TISSUE EXPANDER/RIGHT;  Surgeon: [name], MD;  Location: [place];  Service: Plastics;  Laterality: Right;  . TISSUE EXPANDER REMOVAL Right 01/24/2016  . TRANSTHORACIC ECHOCARDIOGRAM  07-07-2009   grade 1 diastolic dysfunction, ef 76-22%/  mild AR and TR/  trivial MR    There were no vitals filed for this visit.       Subjective Assessment - 02/26/16 0944    Subjective Says she's hoarse from the tube in her throat for surgery.  Here for physical therapy for exercising, lifting my arms up, use my arms more. Was on a 5-lb. lifting restriction, but now is just told not to overdo it.   Patient is accompained by: Family member  fiance   Pertinent History Strong family history of breast cancer and BRCA2 gene. Prophylactic double mastectomy 01/05/16 and had expander placement. Had an infection on the right side and had emergency surgery 01/24/16 and redid  that side and put drainage tube back in.  Drainage tube came out about two weeks ago.  Has had four fills and is done with those. Has consultation 03/14/16 to discuss implants placed.     Currently in Pain? No/denies            [facility] PT Assessment - 02/26/16 0001      Assessment   Medical Diagnosis prophylactic  bilateral mastectomies with expander placement; pt. has BRCA2 gene   Referring Provider Dr. [name]   Onset Date/Surgical Date 01/05/16  and subsequent surgery 01/24/16     Precautions   Precautions None     Restrictions   Weight Bearing Restrictions No     Balance Screen   Has the patient fallen in the past 6 months No   Has the patient had a decrease in activity level because of a fear of falling?  No   Is the patient reluctant to leave their home because of a fear of falling?  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; mostly [place] had been walking before surgery     Cognition   Overall Cognitive Status Within Functional Limits for tasks assessed     Observation/Other Assessments   Observations Pt.wearing sports-type bra with pads (ABD pads and bra pads) for comfort and compression   Skin Integrity skin intact, incisions healed well   Quick DASH  25     Posture/Postural Control   Posture/Postural Control No significant limitations     ROM / Strength   AROM / PROM / Strength AROM     AROM   AROM Assessment Site Shoulder   Right/Left Shoulder Right;Left   Right Shoulder Extension 47 Degrees   Right Shoulder Flexion 132 Degrees   Right Shoulder ABduction 102 Degrees  slight scaption   Right Shoulder Internal Rotation 72 Degrees  supine   Right Shoulder External Rotation 57 Degrees  supine   Left Shoulder Extension 64 Degrees   Left Shoulder Flexion 141 Degrees   Left Shoulder ABduction 149 Degrees  slight scaption   Left Shoulder Internal Rotation --  WFL in supine   Left Shoulder External Rotation 90 Degrees  supine           LYMPHEDEMA/ONCOLOGY QUESTIONNAIRE -  02/26/16 0957      Type   Cancer Type no cancer; prophylactic mastectomies for BRCA2 and strong family history     Surgeries   Mastectomy Date 01/05/16  and 01/24/16   Number Lymph Nodes Removed 0     Treatment   Past Chemotherapy Treatment No   Past Radiation Treatment No           Quick Dash - 02/26/16 0001    Open a tight or new jar Moderate difficulty   Do heavy household chores (wash walls, wash floors) Mild difficulty   Carry a shopping bag or briefcase Mild difficulty   Wash your back Moderate difficulty   Use a knife to cut food No difficulty   Recreational activities in which you take some force or impact through your arm, shoulder, or hand (golf, hammering, tennis) Mild difficulty   During the past week, to what extent has your  arm, shoulder or hand problem interfered with your normal social activities with family, friends, neighbors, or groups? Slightly   During the past week, to what extent has your arm, shoulder or hand problem limited your work or other regular daily activities Slightly   Arm, shoulder, or hand pain. Mild   Tingling (pins and needles) in your arm, shoulder, or hand Mild   Difficulty Sleeping No difficulty   DASH Score 25 %             [facility] Adult PT Treatment/Exercise - 02/26/16 0001      Shoulder Exercises: Standing   Other Standing Exercises dowel exercise for shoulder flexion and abduction, doorway stretch                PT Education - 02/26/16 1221    Education provided Yes   Education Details dowel stretches for shoulder flexion and abduction, doorway stretch for er and chest stretch   Person(s) Educated Patient   Methods Explanation;Demonstration;Verbal cues;Handout   Comprehension Verbalized understanding;Returned demonstration                [facility] Goals - 02/26/16 1227      CC Long Term Goal  #1   Title Pt. will have 150 degrees of active shoulder flexion bilat.   Baseline 132 right, 141 left  on eval   Time 4   Period Weeks   Status New     CC Long Term Goal  #2   Title 160 degrees of active shoulder abduction bilat.   Baseline 102 right, 149 left on eval   Time 4   Period Weeks   Status New     CC Long Term Goal  #3   Title 90 degrees of shoulder er bilat.   Baseline 57 right, 90 left on eval   Time 4   Period Weeks   Status New     CC Long Term Goal  #4   Title quick DASH score reduced to 15 or less   Baseline 25 at eval   Time 4   Period Weeks   Status New            Plan - 02/26/16 1222    Clinical Impression Statement Pt. is a pleasant woman s/p bilateral prophylactic mastectomies because she is BRCA2 positive with many family deaths and illnesses from breast cancer.  She had expanders placed with a revision of one due to infection (right side).  She will have implants later.  She is doing fairly well, but with some tightness in both shoulders, right > left.  Eval is low complexity due to no comorbidities.   Rehab Potential Excellent   Clinical Impairments Affecting Rehab Potential none   PT Frequency 2x / week   PT Duration 3 weeks  to 4 weeks prn   PT Treatment/Interventions ADLs/Self Care Home Management;Therapeutic exercise;Cryotherapy;Moist Heat;Patient/family education;Manual techniques;Passive range of motion;Scar mobilization   PT Next Visit Plan Review and progress HEP; P/AA/A/ROM to both shoulders, right > left.   PT Home Exercise Plan dowel and doorway stretches   Consulted and Agree with Plan of Care Patient      Patient will benefit from skilled therapeutic intervention in order to improve the following deficits and impairments:  Decreased range of motion, Impaired UE functional use  Visit Diagnosis: Stiffness of right shoulder, not elsewhere classified - Plan: PT plan of care cert/re-cert  Stiffness of left shoulder, not elsewhere classified - Plan: PT plan of care  cert/re-cert     Problem List Patient Active Problem List    Diagnosis Date Noted  . Elevated blood pressure reading 02/19/2016  . Anemia 02/19/2016  . Acquired absence of breast and nipple 01/24/2016  . At high risk for breast cancer 01/05/2016  . 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 Endometriosis 02/26/2015  . Cough 11/03/2014  . Genetic testing 09/23/2014  . Obesity 06/19/2014  . Hoarseness 10/03/2013  . Left knee pain 07/03/2013  . Breast pain in female 01/22/2013  . Breast lump on right side at 8 o'clock position 04/24/2012  . TIA (transient ischemic attack) 12/17/2010  . Impaired glucose tolerance 12/17/2010  . Preventative health care 06/02/2010  . GERD 08/31/2009  . OBSTRUCTIVE SLEEP APNEA 08/19/2009  . BREAST HYPERTROPHY 10/30/2008  . BACK PAIN 10/30/2008  . ANXIETY 07/03/2007  . Depression 07/03/2007  . ALLERGIC RHINITIS 07/03/2007    Tryston Gilliam 02/26/2016, 12:35 PM  [place], [place], [address] Phone: [phone]   Fax:  [phone]  Name: Alejandra Harmon MRN: [id number] Date of Birth: 1960-05-07  Serafina Royals, PT 02/26/16 12:35 PM

## 2016-03-01 ENCOUNTER — Ambulatory Visit: Payer: BLUE CROSS/BLUE SHIELD

## 2016-03-03 ENCOUNTER — Ambulatory Visit: Payer: BLUE CROSS/BLUE SHIELD

## 2016-03-03 DIAGNOSIS — M25612 Stiffness of left shoulder, not elsewhere classified: Secondary | ICD-10-CM

## 2016-03-03 DIAGNOSIS — M25611 Stiffness of right shoulder, not elsewhere classified: Secondary | ICD-10-CM

## 2016-03-03 NOTE — Therapy (Signed)
[place], [place], [address] Phone: [phone]   Fax:  [phone]  Physical Therapy Treatment  Patient Details  Name: Alejandra Harmon MRN: [id number] Date of Birth: 1960/10/19 Referring Provider: Dr. [name]  Encounter Date: 03/03/2016      PT End of Session - 03/03/16 1100    Visit Number 2   Number of Visits 9   Date for PT Re-Evaluation 04/06/16   PT Start Time 1021   PT Stop Time 1101   PT Time Calculation (min) 40 min   Activity Tolerance Patient tolerated treatment well   Behavior During Therapy [facility] for tasks assessed/performed      Past Medical History:  Diagnosis Date  . Allergic rhinitis   . Cyst of ovary   . History of esophageal reflux   . Mild sleep apnea    STUDY 07-27-2009-- POSITIONAL / REM AFFECT---  RECOMMENDATION LOOSE WT.  Marland Kitchen Pelvic pain in female   . Right thyroid nodule   . Sciatica of left side   . Wears contact lenses     Past Surgical History:  Procedure Laterality Date  . BREAST ENHANCEMENT SURGERY Bilateral 12-16-2008  . BREAST RECONSTRUCTION WITH PLACEMENT OF TISSUE EXPANDER AND FLEX HD (ACELLULAR HYDRATED DERMIS) Bilateral 01/05/2016  . BREAST RECONSTRUCTION WITH PLACEMENT OF TISSUE EXPANDER AND FLEX HD (ACELLULAR HYDRATED DERMIS) Bilateral 01/05/2016   Procedure: BILATERAL BREAST RECONSTRUCTION WITH PLACEMENT OF TISSUE EXPANDERS AND CORTIVA TISSUE;  Surgeon: [name], MD;  Location: [place];  Service: Plastics;  Laterality: Bilateral;  . CESAREAN SECTION  1994  . COLONOSCOPY  last one 12-23-2013  . ELBOW ULNAR NERVE DECOMPRESSION Right 05-13-2003  . EXCISION BENIGN BREAST CYST  1980  . LAPAROSCOPIC ASSISTED VAGINAL HYSTERECTOMY N/A 02/26/2015   Procedure: LAPAROSCOPIC ASSISTED VAGINAL HYSTERECTOMY ;  Surgeon: Everlene Farrier, MD;  Location: [place];  Service: Gynecology;  Laterality: N/A;  . LAPAROSCOPIC BILATERAL SALPINGO  OOPHERECTOMY Bilateral 02/26/2015   Procedure: LAPAROSCOPIC BILATERAL SALPINGO OOPHORECTOMY;  Surgeon: Everlene Farrier, MD;  Location: [place];  Service: Gynecology;  Laterality: Bilateral;  . LAPAROSCOPIC CHOLECYSTECTOMY  1998  . LAPAROSCOPY/  LYSIS ADHESIONS/  RIGHT OVARY CYSTECTOMY/  ALBATION ENDOMETRIOSIS/  CHROMOPERTUBATION  02-18-2004  . MASTECTOMY Bilateral 01/05/2016   PROPHYLATIC NIPPLE SPARING MASTECTOMY   . NIPPLE SPARING MASTECTOMY Bilateral 01/05/2016   Procedure: BILATERAL PROPHYLATIC NIPPLE SPARING MASTECTOMIES;  Surgeon: Rolm Bookbinder, MD;  Location: [place];  Service: General;  Laterality: Bilateral;  . TISSUE EXPANDER PLACEMENT Right 01/24/2016   Procedure: REMOVAL AND PLACEMENT OF TISSUE EXPANDER/RIGHT;  Surgeon: [name], MD;  Location: [place];  Service: Plastics;  Laterality: Right;  . TISSUE EXPANDER REMOVAL Right 01/24/2016  . TRANSTHORACIC ECHOCARDIOGRAM  07-07-2009   grade 1 diastolic dysfunction, ef 46-28%/  mild AR and TR/  trivial MR    There were no vitals filed for this visit.      Subjective Assessment - 03/03/16 1028    Subjective I've been doing the door stretch and standing exercises with the "stick' and they've helped my shoulder ROM some.   Pertinent History Strong family history of breast cancer and BRCA2 gene. Prophylactic double mastectomy 01/05/16 and had expander placement. Had an infection on the right side and had emergency surgery 01/24/16 and redid that side and put drainage tube back in.  Drainage tube came out about two weeks ago.  Has had four fills and is done with those. Has consultation 03/14/16 to discuss implants  placed.     Currently in Pain? No/denies            [facility] PT Assessment - 03/03/16 0001      AROM   Right Shoulder Flexion 152 Degrees   Right Shoulder ABduction 155 Degrees   Right Shoulder Internal Rotation 90 Degrees   Right Shoulder External Rotation 90 Degrees   Left Shoulder Flexion 152  Degrees   Left Shoulder ABduction 163 Degrees   Left Shoulder External Rotation 90 Degrees                     [facility] Adult PT Treatment/Exercise - 03/03/16 0001      Shoulder Exercises: Pulleys   Flexion 2 minutes   ABduction 2 minutes   ABduction Limitations VC to decrease side trunk lean     Shoulder Exercises: Therapy Ball   Flexion 10 reps  With forward lean into end of stretch     Shoulder Exercises: ROM/Strengthening   Other ROM/Strengthening Exercises Finger Ladder for bil abduction 5 times each side with VC to decrease scapular compensation on Rt, Lt side did well.       Manual Therapy   Passive ROM In Supine to Rt>Lt shoulders into flexion, abduction and D2.                        [facility] Goals - 03/03/16 1105      CC Long Term Goal  #1   Title Pt. will have 150 degrees of active shoulder flexion bilat.   Baseline 132 right, 141 left on eval; bil 152 degrees -03/03/16   Status Achieved     CC Long Term Goal  #3   Title 90 degrees of shoulder er bilat.   Baseline 57 right, 90 left on eval; bil 90 degrees -03/03/16   Status Achieved            Plan - 03/03/16 1101    Clinical Impression Statement Pt has already made great gains with her ROM meeting one of those goals and showing good progress towards the remaining ones. She reports noticing improvement with her motion with her ADLs and has been doing the stretches shown to her at last visit. Pt also tolerated P/ROM well though does present with limitations at her end ranges.     Rehab Potential Excellent   Clinical Impairments Affecting Rehab Potential none   PT Frequency 2x / week   PT Duration 3 weeks  to 4 weeks prn   PT Treatment/Interventions ADLs/Self Care Home Management;Therapeutic exercise;Cryotherapy;Moist Heat;Patient/family education;Manual techniques;Passive range of motion;Scar mobilization   PT Next Visit Plan Review and progress HEP; P/AA/A/ROM to both  shoulders, right > left.   Consulted and Agree with Plan of Care Patient      Patient will benefit from skilled therapeutic intervention in order to improve the following deficits and impairments:  Decreased range of motion, Impaired UE functional use  Visit Diagnosis: Stiffness of right shoulder, not elsewhere classified  Stiffness of left shoulder, not elsewhere classified     Problem List Patient Active Problem List   Diagnosis Date Noted  . Elevated blood pressure reading 02/19/2016  . Anemia 02/19/2016  . Acquired absence of breast and nipple 01/24/2016  . At high risk for breast cancer 01/05/2016  . 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 Endometriosis 02/26/2015  . Cough 11/03/2014  . Genetic testing 09/23/2014  . Obesity 06/19/2014  . Hoarseness 10/03/2013  .  Left knee pain 07/03/2013  . Breast pain in female 01/22/2013  . Breast lump on right side at 8 o'clock position 04/24/2012  . TIA (transient ischemic attack) 12/17/2010  . Impaired glucose tolerance 12/17/2010  . Preventative health care 06/02/2010  . GERD 08/31/2009  . OBSTRUCTIVE SLEEP APNEA 08/19/2009  . BREAST HYPERTROPHY 10/30/2008  . BACK PAIN 10/30/2008  . ANXIETY 07/03/2007  . Depression 07/03/2007  . ALLERGIC RHINITIS 07/03/2007    Otelia Limes, PTA 03/03/2016, 11:06 AM  [place], [place], [address] Phone: [phone]   Fax:  [phone]  Name: Alejandra Harmon MRN: [id number] Date of Birth: 1960-07-07

## 2016-03-09 ENCOUNTER — Ambulatory Visit: Payer: BLUE CROSS/BLUE SHIELD | Attending: Plastic Surgery | Admitting: Physical Therapy

## 2016-03-09 VITALS — BP 140/90

## 2016-03-09 DIAGNOSIS — M25611 Stiffness of right shoulder, not elsewhere classified: Secondary | ICD-10-CM | POA: Diagnosis not present

## 2016-03-09 DIAGNOSIS — M25612 Stiffness of left shoulder, not elsewhere classified: Secondary | ICD-10-CM | POA: Diagnosis present

## 2016-03-09 NOTE — Therapy (Signed)
[place], [place], [address] Phone: [phone]   Fax:  [phone]  Physical Therapy Treatment  Patient Details  Name: Alejandra Harmon MRN: [id number] Date of Birth: 1960-11-10 Referring Provider: Dr. [name]  Encounter Date: 03/09/2016      PT End of Session - 03/09/16 1303    Visit Number 3   Number of Visits 9   Date for PT Re-Evaluation 04/06/16   PT Start Time 1021   PT Stop Time 1101   PT Time Calculation (min) 40 min   Activity Tolerance Patient tolerated treatment well   Behavior During Therapy [facility] for tasks assessed/performed      Past Medical History:  Diagnosis Date  . Allergic rhinitis   . Cyst of ovary   . History of esophageal reflux   . Mild sleep apnea    STUDY 07-27-2009-- POSITIONAL / REM AFFECT---  RECOMMENDATION LOOSE WT.  Marland Kitchen Pelvic pain in female   . Right thyroid nodule   . Sciatica of left side   . Wears contact lenses     Past Surgical History:  Procedure Laterality Date  . BREAST ENHANCEMENT SURGERY Bilateral 12-16-2008  . BREAST RECONSTRUCTION WITH PLACEMENT OF TISSUE EXPANDER AND FLEX HD (ACELLULAR HYDRATED DERMIS) Bilateral 01/05/2016  . BREAST RECONSTRUCTION WITH PLACEMENT OF TISSUE EXPANDER AND FLEX HD (ACELLULAR HYDRATED DERMIS) Bilateral 01/05/2016   Procedure: BILATERAL BREAST RECONSTRUCTION WITH PLACEMENT OF TISSUE EXPANDERS AND CORTIVA TISSUE;  Surgeon: [name], MD;  Location: [place];  Service: Plastics;  Laterality: Bilateral;  . CESAREAN SECTION  1994  . COLONOSCOPY  last one 12-23-2013  . ELBOW ULNAR NERVE DECOMPRESSION Right 05-13-2003  . EXCISION BENIGN BREAST CYST  1980  . LAPAROSCOPIC ASSISTED VAGINAL HYSTERECTOMY N/A 02/26/2015   Procedure: LAPAROSCOPIC ASSISTED VAGINAL HYSTERECTOMY ;  Surgeon: Everlene Farrier, MD;  Location: [place];  Service: Gynecology;  Laterality: N/A;  . LAPAROSCOPIC BILATERAL SALPINGO  OOPHERECTOMY Bilateral 02/26/2015   Procedure: LAPAROSCOPIC BILATERAL SALPINGO OOPHORECTOMY;  Surgeon: Everlene Farrier, MD;  Location: [place];  Service: Gynecology;  Laterality: Bilateral;  . LAPAROSCOPIC CHOLECYSTECTOMY  1998  . LAPAROSCOPY/  LYSIS ADHESIONS/  RIGHT OVARY CYSTECTOMY/  ALBATION ENDOMETRIOSIS/  CHROMOPERTUBATION  02-18-2004  . MASTECTOMY Bilateral 01/05/2016   PROPHYLATIC NIPPLE SPARING MASTECTOMY   . NIPPLE SPARING MASTECTOMY Bilateral 01/05/2016   Procedure: BILATERAL PROPHYLATIC NIPPLE SPARING MASTECTOMIES;  Surgeon: Rolm Bookbinder, MD;  Location: [place];  Service: General;  Laterality: Bilateral;  . TISSUE EXPANDER PLACEMENT Right 01/24/2016   Procedure: REMOVAL AND PLACEMENT OF TISSUE EXPANDER/RIGHT;  Surgeon: [name], MD;  Location: [place];  Service: Plastics;  Laterality: Right;  . TISSUE EXPANDER REMOVAL Right 01/24/2016  . TRANSTHORACIC ECHOCARDIOGRAM  07-07-2009   grade 1 diastolic dysfunction, ef 96-75%/  mild AR and TR/  trivial MR    Vitals:   03/09/16 1059  BP: 140/90        Subjective Assessment - 03/09/16 1023    Subjective "It's going very well.  Last night I had a sharp pain below my shoulder blade on the right; it lasted 3-4 seconds and that was it."  Isn't doing her stretches a lot, but is using her arms more.  Doing a lot more functionally.   Currently in Pain? No/denies                         [facility] Adult PT Treatment/Exercise - 03/09/16 0001  Shoulder Exercises: Pulleys   Flexion 2 minutes   ABduction 2 minutes     Shoulder Exercises: Therapy Ball   Flexion 10 reps  With forward lean into end of stretch     Shoulder Exercises: ROM/Strengthening   Other ROM/Strengthening Exercises Finger Ladder for bil abduction 5 times each side with VC to decrease scapular compensation on Rt, Lt side did well.       Shoulder Exercises: Stretch   Other Shoulder Stretches supine lower trunk rotation with  arms outstretched for chest/anterior shoulder stretches x 30-45 seconds each way     Manual Therapy   Manual Therapy Myofascial release   Myofascial Release UE myofascial pulling with movement into abduction, left and right   Passive ROM In supine for right and left shoulders into er, flexion, and abduction                PT Education - 03/09/16 1302    Education provided Yes   Education Details lower trunk rotation with arms outstretched for anterior shoulder/chest stretch   Person(s) Educated Patient   Methods Explanation;Demonstration;Verbal cues   Comprehension Verbalized understanding;Returned demonstration                [facility] Goals - 03/03/16 1105      CC Long Term Goal  #1   Title Pt. will have 150 degrees of active shoulder flexion bilat.   Baseline 132 right, 141 left on eval; bil 152 degrees -03/03/16   Status Achieved     CC Long Term Goal  #3   Title 90 degrees of shoulder er bilat.   Baseline 57 right, 90 left on eval; bil 90 degrees -03/03/16   Status Achieved            Plan - 03/09/16 1305    Clinical Impression Statement Patient doing well with ROM but still with limitations and tightness that benefits from manual treatment.   Rehab Potential Excellent   Clinical Impairments Affecting Rehab Potential none   PT Frequency 2x / week   PT Duration 3 weeks   PT Treatment/Interventions ADLs/Self Care Home Management;Therapeutic exercise;Cryotherapy;Moist Heat;Patient/family education;Manual techniques;Passive range of motion;Scar mobilization   PT Next Visit Plan Remeasure AROM. Review and progress HEP; P/AA/A/ROM to both shoulders, right > left.   PT Home Exercise Plan dowel and doorway stretches, supine lower trunk rotation with arms outstretched   Consulted and Agree with Plan of Care Patient      Patient will benefit from skilled therapeutic intervention in order to improve the following deficits and impairments:  Decreased  range of motion, Impaired UE functional use  Visit Diagnosis: Stiffness of right shoulder, not elsewhere classified  Stiffness of left shoulder, not elsewhere classified     Problem List Patient Active Problem List   Diagnosis Date Noted  . Elevated blood pressure reading 02/19/2016  . Anemia 02/19/2016  . Acquired absence of breast and nipple 01/24/2016  . At high risk for breast cancer 01/05/2016  . 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 Endometriosis 02/26/2015  . Cough 11/03/2014  . Genetic testing 09/23/2014  . Obesity 06/19/2014  . Hoarseness 10/03/2013  . Left knee pain 07/03/2013  . Breast pain in female 01/22/2013  . Breast lump on right side at 8 o'clock position 04/24/2012  . TIA (transient ischemic attack) 12/17/2010  . Impaired glucose tolerance 12/17/2010  . Preventative health care 06/02/2010  . GERD 08/31/2009  . OBSTRUCTIVE SLEEP APNEA 08/19/2009  . BREAST HYPERTROPHY  10/30/2008  . BACK PAIN 10/30/2008  . ANXIETY 07/03/2007  . Depression 07/03/2007  . ALLERGIC RHINITIS 07/03/2007    Aisling Emigh 03/09/2016, 1:07 PM  [place], [place], [address] Phone: [phone]   Fax:  [phone]  Name: Alejandra Harmon MRN: [id number] Date of Birth: 07-Jun-1960  Serafina Royals, PT 03/09/16 1:07 PM

## 2016-03-10 NOTE — H&P (Signed)
Subjective:     Patient ID: Alejandra Harmon is a 56 y.o. female.  HPI  9 weeks post op bilateral NSM with TE/ADM reconstruction. Course complicated by seroma, cellulitis right breast and now 4 weeks post washout right breast cavity, removal/replacement right TE. Culture with S. Aureus, completed Cipro course. Plan implant exchange.   Known PALB2 mutation with several family members with breast ca. Patient with screening MMG 06/2015 normal. Had diagnostic MMG 08/2015 for palpable mass and fat necrosis on right and cyst on left noted. MRI demonstrated 8 x 7 x 7 mm enhancing mass at 3 o clock. She underwent subsequent second look [place] with 87m suspicious mass right and borderline prominent right axillary LN with a cortex measuring 4 mm. [place] biopsy with fat necrosis. She underwent repeat MRI to ensure mass biopsied and clip noted within mass. Final pathology benign bilateral.  History of breast reduction 2010 with [name] Prior to mastectomies B cup, down from D. Left mastectomy 843 g Right 835 g.  Review of Systems    Objective:   Physical Exam  Cardiovascular: Normal rate and normal heart sounds.   Pulmonary/Chest: Effort normal and breath sounds normal.    Right NAC and mastectomy flap with depression/fold No displacement in supine position Chest:  SN to nipple R 21.5 cm, L 23 BW R 18 L 18 cm Nipple to IMF R 10 L 10 cm     Assessment:     PALB2 mutation, History breast reduction S/p bilateral NSM, TE/ADM (Cortiva) reconstruction S/p removal/replacement right TE, removal ADM     Plan:      Plan second stage surgery for implant exchange, possible replacement ADM on right, possible lipofilling. Reviewed purpose lipofilling for contour, increased thickness flaps over implant. Reviewed variable take graft, donor site pain and risk contour deformity, need for compression donor site, possible need to repeat procedure, fat necrosis that presents as masses. Reviewed saline vs  silicone, round vs shaped, smooth vs textured. Reviewed incidence ALCL with textured implants. Reviewed MRI surveillance silicone implants. Reviewed shared risks of rippling, rupture, contracture. Patient has elected for round silicone.  Will assess intra op need for replacement ADM on right based width of capsule vs implant- if used will have drain on this side.    Natrelle 133 FX-13-T 550 ml tissue expanders placed bilateral,  RIGHT fill volume 555 ml  LEFT fill volume 555 ml    BIrene Limbo MD [facility] [phone]

## 2016-03-11 ENCOUNTER — Ambulatory Visit: Payer: BLUE CROSS/BLUE SHIELD | Admitting: Physical Therapy

## 2016-03-11 DIAGNOSIS — M25612 Stiffness of left shoulder, not elsewhere classified: Secondary | ICD-10-CM

## 2016-03-11 DIAGNOSIS — M25611 Stiffness of right shoulder, not elsewhere classified: Secondary | ICD-10-CM | POA: Diagnosis not present

## 2016-03-11 NOTE — Therapy (Signed)
[place], [place], [address] Phone: [phone]   Fax:  [phone]  Physical Therapy Treatment  Patient Details  Name: Alejandra Harmon MRN: [id number] Date of Birth: May 11, 1960 Referring Provider: Dr. [name]  Encounter Date: 03/11/2016      PT End of Session - 03/11/16 1155    Visit Number 4   Number of Visits 9   Date for PT Re-Evaluation 04/06/16   PT Start Time 1110   PT Stop Time 1151   PT Time Calculation (min) 41 min   Activity Tolerance Patient tolerated treatment well   Behavior During Therapy [facility] for tasks assessed/performed      Past Medical History:  Diagnosis Date  . Allergic rhinitis   . Cyst of ovary   . History of esophageal reflux   . Mild sleep apnea    STUDY 07-27-2009-- POSITIONAL / REM AFFECT---  RECOMMENDATION LOOSE WT.  Marland Kitchen Pelvic pain in female   . Right thyroid nodule   . Sciatica of left side   . Wears contact lenses     Past Surgical History:  Procedure Laterality Date  . BREAST ENHANCEMENT SURGERY Bilateral 12-16-2008  . BREAST RECONSTRUCTION WITH PLACEMENT OF TISSUE EXPANDER AND FLEX HD (ACELLULAR HYDRATED DERMIS) Bilateral 01/05/2016  . BREAST RECONSTRUCTION WITH PLACEMENT OF TISSUE EXPANDER AND FLEX HD (ACELLULAR HYDRATED DERMIS) Bilateral 01/05/2016   Procedure: BILATERAL BREAST RECONSTRUCTION WITH PLACEMENT OF TISSUE EXPANDERS AND CORTIVA TISSUE;  Surgeon: [name], MD;  Location: [place];  Service: Plastics;  Laterality: Bilateral;  . CESAREAN SECTION  1994  . COLONOSCOPY  last one 12-23-2013  . ELBOW ULNAR NERVE DECOMPRESSION Right 05-13-2003  . EXCISION BENIGN BREAST CYST  1980  . LAPAROSCOPIC ASSISTED VAGINAL HYSTERECTOMY N/A 02/26/2015   Procedure: LAPAROSCOPIC ASSISTED VAGINAL HYSTERECTOMY ;  Surgeon: Everlene Farrier, MD;  Location: [place];  Service: Gynecology;  Laterality: N/A;  . LAPAROSCOPIC BILATERAL SALPINGO  OOPHERECTOMY Bilateral 02/26/2015   Procedure: LAPAROSCOPIC BILATERAL SALPINGO OOPHORECTOMY;  Surgeon: Everlene Farrier, MD;  Location: [place];  Service: Gynecology;  Laterality: Bilateral;  . LAPAROSCOPIC CHOLECYSTECTOMY  1998  . LAPAROSCOPY/  LYSIS ADHESIONS/  RIGHT OVARY CYSTECTOMY/  ALBATION ENDOMETRIOSIS/  CHROMOPERTUBATION  02-18-2004  . MASTECTOMY Bilateral 01/05/2016   PROPHYLATIC NIPPLE SPARING MASTECTOMY   . NIPPLE SPARING MASTECTOMY Bilateral 01/05/2016   Procedure: BILATERAL PROPHYLATIC NIPPLE SPARING MASTECTOMIES;  Surgeon: Rolm Bookbinder, MD;  Location: [place];  Service: General;  Laterality: Bilateral;  . TISSUE EXPANDER PLACEMENT Right 01/24/2016   Procedure: REMOVAL AND PLACEMENT OF TISSUE EXPANDER/RIGHT;  Surgeon: [name], MD;  Location: [place];  Service: Plastics;  Laterality: Right;  . TISSUE EXPANDER REMOVAL Right 01/24/2016  . TRANSTHORACIC ECHOCARDIOGRAM  07-07-2009   grade 1 diastolic dysfunction, ef 83-15%/  mild AR and TR/  trivial MR    There were no vitals filed for this visit.      Subjective Assessment - 03/11/16 1112    Subjective Same.  Continuing to do more things.  Surgery for implants is next Friday, so we'll see about that.   Currently in Pain? No/denies            [facility] PT Assessment - 03/11/16 0001      AROM   Right Shoulder Flexion 150 Degrees   Right Shoulder ABduction 170 Degrees   Left Shoulder Flexion 154 Degrees   Left Shoulder ABduction 180 Degrees  [facility] Adult PT Treatment/Exercise - 03/11/16 0001      Shoulder Exercises: Pulleys   Flexion 2 minutes   ABduction 2 minutes     Shoulder Exercises: Therapy Ball   Flexion 10 reps     Shoulder Exercises: ROM/Strengthening   Other ROM/Strengthening Exercises Finger Ladder for bil abduction 5 times each side with VC to decrease scapular compensation on Rt, Lt side did well.       Manual Therapy   Myofascial Release UE  myofascial pulling with movement into abduction, left and right individually and then both together   Passive ROM In supine for right and left shoulders into er, flexion, and abduction                        [facility] Goals - 03/11/16 1157      CC Long Term Goal  #2   Title 160 degrees of active shoulder abduction bilat.   Status Achieved            Plan - 03/11/16 1155    Clinical Impression Statement Patient has now met her shoulder abduction goals as well as other ROM goals; however, with passive ROM she still shows tightness that benefits from manual treatment.  She has one more session next week prior to her implant surgery, so we will do that session, then she will need okay from plastic surgeon to return if needed after surgery.   Rehab Potential Excellent   Clinical Impairments Affecting Rehab Potential none   PT Frequency 2x / week   PT Duration 3 weeks   PT Treatment/Interventions ADLs/Self Care Home Management;Therapeutic exercise;Cryotherapy;Moist Heat;Patient/family education;Manual techniques;Passive range of motion;Scar mobilization   PT Next Visit Plan Have patient repeat quick DASH.  Review and progress HEP; P/AA/A/ROM to both shoulders, right > left.   PT Home Exercise Plan dowel and doorway stretches, supine lower trunk rotation with arms outstretched   Consulted and Agree with Plan of Care Patient      Patient will benefit from skilled therapeutic intervention in order to improve the following deficits and impairments:  Decreased range of motion, Impaired UE functional use  Visit Diagnosis: Stiffness of right shoulder, not elsewhere classified  Stiffness of left shoulder, not elsewhere classified     Problem List Patient Active Problem List   Diagnosis Date Noted  . Elevated blood pressure reading 02/19/2016  . Anemia 02/19/2016  . Acquired absence of breast and nipple 01/24/2016  . At high risk for breast cancer 01/05/2016  .  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 Endometriosis 02/26/2015  . Cough 11/03/2014  . Genetic testing 09/23/2014  . Obesity 06/19/2014  . Hoarseness 10/03/2013  . Left knee pain 07/03/2013  . Breast pain in female 01/22/2013  . Breast lump on right side at 8 o'clock position 04/24/2012  . TIA (transient ischemic attack) 12/17/2010  . Impaired glucose tolerance 12/17/2010  . Preventative health care 06/02/2010  . GERD 08/31/2009  . OBSTRUCTIVE SLEEP APNEA 08/19/2009  . BREAST HYPERTROPHY 10/30/2008  . BACK PAIN 10/30/2008  . ANXIETY 07/03/2007  . Depression 07/03/2007  . ALLERGIC RHINITIS 07/03/2007    [place],Alejandra Harmon 03/11/2016, 11:59 AM  [place], [place], [address] Phone: [phone]   Fax:  [phone]  Name: Alejandra Harmon MRN: [id number] Date of Birth: 03-14-1960  Serafina Royals, PT 03/11/16 11:59 AM

## 2016-03-15 ENCOUNTER — Encounter (HOSPITAL_BASED_OUTPATIENT_CLINIC_OR_DEPARTMENT_OTHER): Payer: Self-pay | Admitting: *Deleted

## 2016-03-15 NOTE — Progress Notes (Signed)
Patient given boost breeze with instruction to drink by 0730 DOS. Pt voiced understanding and did not have any questions.

## 2016-03-16 ENCOUNTER — Ambulatory Visit: Payer: BLUE CROSS/BLUE SHIELD | Admitting: Physical Therapy

## 2016-03-16 DIAGNOSIS — M25611 Stiffness of right shoulder, not elsewhere classified: Secondary | ICD-10-CM | POA: Diagnosis not present

## 2016-03-16 DIAGNOSIS — M25612 Stiffness of left shoulder, not elsewhere classified: Secondary | ICD-10-CM

## 2016-03-16 NOTE — Therapy (Signed)
[place], [place], [address] Phone: [phone]   Fax:  [phone]  Physical Therapy Treatment  Patient Details  Name: Alejandra Harmon MRN: [id number] Date of Birth: [birth year] Referring Provider: Dr. [name]  Encounter Date: 03/16/2016      PT End of Session - 03/16/16 1158    Visit Number 5   Number of Visits 9   Date for PT Re-Evaluation 04/06/16   PT Start Time 1010   PT Stop Time 1053   PT Time Calculation (min) 43 min   Activity Tolerance Patient tolerated treatment well   Behavior During Therapy [facility] for tasks assessed/performed      Past Medical History:  Diagnosis Date  . Allergic rhinitis   . Cancer ([place]) 12/2015   breast cancer  . Cyst of ovary   . History of esophageal reflux   . Mild sleep apnea    STUDY 07-27-2009-- POSITIONAL / REM AFFECT---  RECOMMENDATION LOOSE WT.  Marland Kitchen Pelvic pain in female   . Right thyroid nodule   . Sciatica of left side   . Wears contact lenses     Past Surgical History:  Procedure Laterality Date  . ABDOMINAL HYSTERECTOMY    . BREAST ENHANCEMENT SURGERY Bilateral 12-16-2008  . BREAST RECONSTRUCTION WITH PLACEMENT OF TISSUE EXPANDER AND FLEX HD (ACELLULAR HYDRATED DERMIS) Bilateral 01/05/2016  . BREAST RECONSTRUCTION WITH PLACEMENT OF TISSUE EXPANDER AND FLEX HD (ACELLULAR HYDRATED DERMIS) Bilateral 01/05/2016   Procedure: BILATERAL BREAST RECONSTRUCTION WITH PLACEMENT OF TISSUE EXPANDERS AND CORTIVA TISSUE;  Surgeon: [name], MD;  Location: [place];  Service: Plastics;  Laterality: Bilateral;  . CESAREAN SECTION  1994  . COLONOSCOPY  last one 12-23-2013  . ELBOW ULNAR NERVE DECOMPRESSION Right 05-13-2003  . EXCISION BENIGN BREAST CYST  1980  . LAPAROSCOPIC ASSISTED VAGINAL HYSTERECTOMY N/A 02/26/2015   Procedure: LAPAROSCOPIC ASSISTED VAGINAL HYSTERECTOMY ;  Surgeon: Everlene Farrier, MD;  Location: [place];   Service: Gynecology;  Laterality: N/A;  . LAPAROSCOPIC BILATERAL SALPINGO OOPHERECTOMY Bilateral 02/26/2015   Procedure: LAPAROSCOPIC BILATERAL SALPINGO OOPHORECTOMY;  Surgeon: Everlene Farrier, MD;  Location: [place];  Service: Gynecology;  Laterality: Bilateral;  . LAPAROSCOPIC CHOLECYSTECTOMY  1998  . LAPAROSCOPY/  LYSIS ADHESIONS/  RIGHT OVARY CYSTECTOMY/  ALBATION ENDOMETRIOSIS/  CHROMOPERTUBATION  02-18-2004  . MASTECTOMY Bilateral 01/05/2016   PROPHYLATIC NIPPLE SPARING MASTECTOMY   . NIPPLE SPARING MASTECTOMY Bilateral 01/05/2016   Procedure: BILATERAL PROPHYLATIC NIPPLE SPARING MASTECTOMIES;  Surgeon: Rolm Bookbinder, MD;  Location: [place];  Service: General;  Laterality: Bilateral;  . TISSUE EXPANDER PLACEMENT Right 01/24/2016   Procedure: REMOVAL AND PLACEMENT OF TISSUE EXPANDER/RIGHT;  Surgeon: [name], MD;  Location: [place];  Service: Plastics;  Laterality: Right;  . TISSUE EXPANDER REMOVAL Right 01/24/2016  . TRANSTHORACIC ECHOCARDIOGRAM  07-07-2009   grade 1 diastolic dysfunction, ef 31-49%/  mild AR and TR/  trivial MR    There were no vitals filed for this visit.      Subjective Assessment - 03/16/16 1012    Subjective "I just learned that my late brother's wife was told she has two weeks to live.  She's in [place] right now."   Currently in Pain? No/denies  but left arm tingling with reaching back            [facility] PT Assessment - 03/16/16 0001      Observation/Other Assessments   Quick DASH  13.64  Katina Dung - 03/16/16 0001    Open a tight or new jar Mild difficulty   Do heavy household chores (wash walls, wash floors) No difficulty   Carry a shopping bag or briefcase Moderate difficulty   Wash your back Moderate difficulty   Use a knife to cut food No difficulty   During the past week, to what extent has your arm, shoulder or hand problem interfered with your normal social activities with family, friends,  neighbors, or groups? Not at all   During the past week, to what extent has your arm, shoulder or hand problem limited your work or other regular daily activities Not at all   Arm, shoulder, or hand pain. Mild   Tingling (pins and needles) in your arm, shoulder, or hand Mild   Difficulty Sleeping No difficulty   DASH Score 13.64 %               [facility] Adult PT Treatment/Exercise - 03/16/16 0001      Shoulder Exercises: Pulleys   Flexion 2 minutes   ABduction 2 minutes     Shoulder Exercises: Therapy Ball   Flexion 10 reps  With forward lean into end of stretch     Shoulder Exercises: ROM/Strengthening   Other ROM/Strengthening Exercises Finger ladder for abduction 5 times on each side to tolerance     Manual Therapy   Myofascial Release left and right UE myofascial pulling with movement into abduction, done separately   Passive ROM In supine for right and left shoulders into er, flexion, and abduction; stretch into right shoulder horizontal abduction                        Long Term Clinic Goals - 03/16/16 1015      CC Long Term Goal  #1   Title Pt. will have 150 degrees of active shoulder flexion bilat.   Status Achieved     CC Long Term Goal  #2   Title 160 degrees of active shoulder abduction bilat.   Status Achieved     CC Long Term Goal  #3   Title 90 degrees of shoulder er bilat.   Status Achieved     CC Long Term Goal  #4   Title quick DASH score reduced to 15 or less   Baseline 25 at eval; 13.64 at [place] - 03/16/16 1159    Clinical Impression Statement Patient has now met all her goals.  She still has some tightness in both shoulders with passive abduction, but she has good functional range of motion actively and reports doing a variety of activities with her arms.  She will have her implant surgery on Friday this week.  She is discharged today and knows she will need a new referral if she needs therapy  following surgery.   Rehab Potential Excellent   Clinical Impairments Affecting Rehab Potential none   PT Treatment/Interventions ADLs/Self Care Home Management;Therapeutic exercise;Cryotherapy;Moist Heat;Patient/family education;Manual techniques;Passive range of motion;Scar mobilization   PT Next Visit Plan discharge this visit   Consulted and Agree with Plan of Care Patient      Patient will benefit from skilled therapeutic intervention in order to improve the following deficits and impairments:  Decreased range of motion, Impaired UE functional use  Visit Diagnosis: Stiffness of right shoulder, not elsewhere classified  Stiffness of left shoulder, not elsewhere classified  Problem List Patient Active Problem List   Diagnosis Date Noted  . Elevated blood pressure reading 02/19/2016  . Anemia 02/19/2016  . Acquired absence of breast and nipple 01/24/2016  . At high risk for breast cancer 01/05/2016  . 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 Endometriosis 02/26/2015  . Cough 11/03/2014  . Genetic testing 09/23/2014  . Obesity 06/19/2014  . Hoarseness 10/03/2013  . Left knee pain 07/03/2013  . Breast pain in female 01/22/2013  . Breast lump on right side at 8 o'clock position 04/24/2012  . TIA (transient ischemic attack) 12/17/2010  . Impaired glucose tolerance 12/17/2010  . Preventative health care 06/02/2010  . GERD 08/31/2009  . OBSTRUCTIVE SLEEP APNEA 08/19/2009  . BREAST HYPERTROPHY 10/30/2008  . BACK PAIN 10/30/2008  . ANXIETY 07/03/2007  . Depression 07/03/2007  . ALLERGIC RHINITIS 07/03/2007    Jerrilynn Mikowski 03/16/2016, 12:02 PM  [place], [place], [address] Phone: [phone]   Fax:  [phone]  Name: KINA SHIFFMAN MRN: [id number] Date of Birth: December 26, 1960  PHYSICAL THERAPY DISCHARGE SUMMARY  Visits from Start of Care: 5  Current functional level  related to goals / functional outcomes:  Goals met as noted above.   Remaining deficits: Slight tightness remains, particularly in pure shoulder abduction bilat.   Education / Equipment: HEP for stretching. Plan: Patient agrees to discharge.  Patient goals were partially met. Patient is being discharged due to meeting the stated rehab goals.  ?????    She will have implant surgery for both breasts in two days on 03/18/16.  Serafina Royals, PT 03/16/16 12:05 PM

## 2016-03-18 ENCOUNTER — Ambulatory Visit (HOSPITAL_BASED_OUTPATIENT_CLINIC_OR_DEPARTMENT_OTHER): Payer: BLUE CROSS/BLUE SHIELD | Admitting: Anesthesiology

## 2016-03-18 ENCOUNTER — Encounter (HOSPITAL_BASED_OUTPATIENT_CLINIC_OR_DEPARTMENT_OTHER): Payer: Self-pay | Admitting: Anesthesiology

## 2016-03-18 ENCOUNTER — Encounter (HOSPITAL_BASED_OUTPATIENT_CLINIC_OR_DEPARTMENT_OTHER)
Admission: RE | Disposition: A | Discharge status: Home / Self Care | Payer: Self-pay | Source: Ambulatory Visit | Attending: Plastic Surgery

## 2016-03-18 ENCOUNTER — Ambulatory Visit (HOSPITAL_BASED_OUTPATIENT_CLINIC_OR_DEPARTMENT_OTHER)
Admission: RE | Admit: 2016-03-18 | Discharge: 2016-03-19 | Disposition: A | Discharge status: Home / Self Care | Payer: BLUE CROSS/BLUE SHIELD | Source: Ambulatory Visit | Attending: Plastic Surgery | Admitting: Plastic Surgery

## 2016-03-18 ENCOUNTER — Encounter: Payer: BLUE CROSS/BLUE SHIELD | Admitting: Physical Therapy

## 2016-03-18 DIAGNOSIS — Z853 Personal history of malignant neoplasm of breast: Secondary | ICD-10-CM | POA: Insufficient documentation

## 2016-03-18 DIAGNOSIS — Z9010 Acquired absence of unspecified breast and nipple: Secondary | ICD-10-CM

## 2016-03-18 DIAGNOSIS — D649 Anemia, unspecified: Secondary | ICD-10-CM | POA: Diagnosis not present

## 2016-03-18 DIAGNOSIS — Z421 Encounter for breast reconstruction following mastectomy: Secondary | ICD-10-CM | POA: Insufficient documentation

## 2016-03-18 DIAGNOSIS — F419 Anxiety disorder, unspecified: Secondary | ICD-10-CM | POA: Insufficient documentation

## 2016-03-18 DIAGNOSIS — Z882 Allergy status to sulfonamides status: Secondary | ICD-10-CM | POA: Insufficient documentation

## 2016-03-18 DIAGNOSIS — F329 Major depressive disorder, single episode, unspecified: Secondary | ICD-10-CM | POA: Insufficient documentation

## 2016-03-18 DIAGNOSIS — I10 Essential (primary) hypertension: Secondary | ICD-10-CM | POA: Diagnosis not present

## 2016-03-18 DIAGNOSIS — K219 Gastro-esophageal reflux disease without esophagitis: Secondary | ICD-10-CM | POA: Insufficient documentation

## 2016-03-18 DIAGNOSIS — Z803 Family history of malignant neoplasm of breast: Secondary | ICD-10-CM | POA: Insufficient documentation

## 2016-03-18 DIAGNOSIS — Z1501 Genetic susceptibility to malignant neoplasm of breast: Secondary | ICD-10-CM | POA: Insufficient documentation

## 2016-03-18 DIAGNOSIS — Z91018 Allergy to other foods: Secondary | ICD-10-CM | POA: Insufficient documentation

## 2016-03-18 DIAGNOSIS — Z91011 Allergy to milk products: Secondary | ICD-10-CM | POA: Diagnosis not present

## 2016-03-18 DIAGNOSIS — Z9013 Acquired absence of bilateral breasts and nipples: Secondary | ICD-10-CM | POA: Insufficient documentation

## 2016-03-18 DIAGNOSIS — G4730 Sleep apnea, unspecified: Secondary | ICD-10-CM | POA: Insufficient documentation

## 2016-03-18 HISTORY — DX: Malignant (primary) neoplasm, unspecified: C80.1

## 2016-03-18 HISTORY — PX: LIPOSUCTION WITH LIPOFILLING: SHX6436

## 2016-03-18 HISTORY — PX: REMOVAL OF BILATERAL TISSUE EXPANDERS WITH PLACEMENT OF BILATERAL BREAST IMPLANTS: SHX6431

## 2016-03-18 LAB — POCT HEMOGLOBIN-HEMACUE
HEMOGLOBIN: 7.1 g/dL — AB (ref 12.0–15.0)
Hemoglobin: 12.6 g/dL (ref 12.0–15.0)

## 2016-03-18 SURGERY — REMOVAL, TISSUE EXPANDER, BREAST, BILATERAL, WITH BILATERAL IMPLANT IMPLANT INSERTION
Anesthesia: General | Laterality: Bilateral

## 2016-03-18 MED ORDER — PROPOFOL 10 MG/ML IV BOLUS
INTRAVENOUS | Status: DC | PRN
  Administered 2016-03-18: 150 mg via INTRAVENOUS

## 2016-03-18 MED ORDER — FENTANYL CITRATE (PF) 100 MCG/2ML IJ SOLN
INTRAMUSCULAR | Status: AC
  Filled 2016-03-18: qty 2

## 2016-03-18 MED ORDER — GABAPENTIN 300 MG PO CAPS
300.0000 mg | ORAL_CAPSULE | ORAL | Status: AC
  Administered 2016-03-18: 300 mg via ORAL

## 2016-03-18 MED ORDER — ACETAMINOPHEN 500 MG PO TABS
ORAL_TABLET | ORAL | Status: AC
  Filled 2016-03-18: qty 2

## 2016-03-18 MED ORDER — ROCURONIUM BROMIDE 10 MG/ML (PF) SYRINGE
PREFILLED_SYRINGE | INTRAVENOUS | Status: AC
  Filled 2016-03-18: qty 10

## 2016-03-18 MED ORDER — HYDROCHLOROTHIAZIDE 12.5 MG PO CAPS: 12.5000 mg | ORAL_CAPSULE | Freq: Every day | ORAL | Status: DC

## 2016-03-18 MED ORDER — SCOPOLAMINE 1 MG/3DAYS TD PT72
1.0000 | MEDICATED_PATCH | Freq: Once | TRANSDERMAL | Status: DC | PRN
  Administered 2016-03-18: 1.5 mg via TRANSDERMAL

## 2016-03-18 MED ORDER — KCL IN DEXTROSE-NACL 20-5-0.45 MEQ/L-%-% IV SOLN
INTRAVENOUS | Status: DC
  Administered 2016-03-18: 14:00:00 via INTRAVENOUS
  Filled 2016-03-18: qty 1000

## 2016-03-18 MED ORDER — LACTATED RINGERS IV SOLN
INTRAVENOUS | Status: DC
  Administered 2016-03-18: 10:00:00 via INTRAVENOUS

## 2016-03-18 MED ORDER — SODIUM CHLORIDE 0.9 % IV SOLN
INTRAVENOUS | Status: DC | PRN
  Administered 2016-03-18: 1000 mL

## 2016-03-18 MED ORDER — CEFAZOLIN SODIUM-DEXTROSE 2-4 GM/100ML-% IV SOLN
2.0000 g | Freq: Three times a day (TID) | INTRAVENOUS | Status: AC
  Administered 2016-03-18 – 2016-03-19 (×2): 2 g via INTRAVENOUS
  Filled 2016-03-18 (×2): qty 100

## 2016-03-18 MED ORDER — ACETAMINOPHEN 500 MG PO TABS
1000.0000 mg | ORAL_TABLET | ORAL | Status: AC
  Administered 2016-03-18: 1000 mg via ORAL

## 2016-03-18 MED ORDER — FENTANYL CITRATE (PF) 100 MCG/2ML IJ SOLN
25.0000 ug | INTRAMUSCULAR | Status: DC | PRN
  Administered 2016-03-18 (×2): 50 ug via INTRAVENOUS

## 2016-03-18 MED ORDER — CELECOXIB 400 MG PO CAPS
400.0000 mg | ORAL_CAPSULE | ORAL | Status: AC
  Administered 2016-03-18: 400 mg via ORAL

## 2016-03-18 MED ORDER — KETOROLAC TROMETHAMINE 30 MG/ML IJ SOLN
30.0000 mg | Freq: Three times a day (TID) | INTRAMUSCULAR | Status: AC
  Administered 2016-03-18 – 2016-03-19 (×2): 30 mg via INTRAVENOUS
  Filled 2016-03-18 (×2): qty 1

## 2016-03-18 MED ORDER — FENTANYL CITRATE (PF) 100 MCG/2ML IJ SOLN
50.0000 ug | INTRAMUSCULAR | Status: AC | PRN
  Administered 2016-03-18 (×2): 25 ug via INTRAVENOUS
  Administered 2016-03-18: 100 ug via INTRAVENOUS

## 2016-03-18 MED ORDER — DEXAMETHASONE SODIUM PHOSPHATE 4 MG/ML IJ SOLN
INTRAMUSCULAR | Status: DC | PRN
  Administered 2016-03-18: 10 mg via INTRAVENOUS

## 2016-03-18 MED ORDER — ONDANSETRON HCL 4 MG/2ML IJ SOLN
INTRAMUSCULAR | Status: AC
  Filled 2016-03-18: qty 2

## 2016-03-18 MED ORDER — DEXAMETHASONE SODIUM PHOSPHATE 10 MG/ML IJ SOLN
INTRAMUSCULAR | Status: AC
Start: 2016-03-18 — End: 2016-03-18
  Filled 2016-03-18: qty 1

## 2016-03-18 MED ORDER — OXYCODONE HCL 5 MG PO TABS: 5.0000 mg | ORAL_TABLET | ORAL | 0 refills | Status: DC | PRN

## 2016-03-18 MED ORDER — SCOPOLAMINE 1 MG/3DAYS TD PT72
MEDICATED_PATCH | TRANSDERMAL | Status: AC
  Filled 2016-03-18: qty 1

## 2016-03-18 MED ORDER — CIPROFLOXACIN HCL 500 MG PO TABS: 500.0000 mg | ORAL_TABLET | Freq: Two times a day (BID) | ORAL | 0 refills | Status: DC

## 2016-03-18 MED ORDER — PROMETHAZINE HCL 25 MG/ML IJ SOLN: 6.2500 mg | INTRAMUSCULAR | Status: DC | PRN

## 2016-03-18 MED ORDER — GABAPENTIN 300 MG PO CAPS
ORAL_CAPSULE | ORAL | Status: AC
  Filled 2016-03-18: qty 1

## 2016-03-18 MED ORDER — LIDOCAINE 2% (20 MG/ML) 5 ML SYRINGE
INTRAMUSCULAR | Status: AC
  Filled 2016-03-18: qty 5

## 2016-03-18 MED ORDER — CEFAZOLIN SODIUM-DEXTROSE 2-4 GM/100ML-% IV SOLN
2.0000 g | INTRAVENOUS | Status: AC
  Administered 2016-03-18 (×2): 2 g via INTRAVENOUS

## 2016-03-18 MED ORDER — ONDANSETRON HCL 4 MG/2ML IJ SOLN
INTRAMUSCULAR | Status: DC | PRN
  Administered 2016-03-18: 4 mg via INTRAVENOUS

## 2016-03-18 MED ORDER — KETOROLAC TROMETHAMINE 30 MG/ML IJ SOLN
INTRAMUSCULAR | Status: DC | PRN
  Administered 2016-03-18: 30 mg via INTRAVENOUS

## 2016-03-18 MED ORDER — LIDOCAINE HCL (CARDIAC) 20 MG/ML IV SOLN
INTRAVENOUS | Status: DC | PRN
  Administered 2016-03-18: 50 mg via INTRAVENOUS

## 2016-03-18 MED ORDER — METHOCARBAMOL 500 MG PO TABS
500.0000 mg | ORAL_TABLET | Freq: Four times a day (QID) | ORAL | Status: DC | PRN
  Administered 2016-03-18: 500 mg via ORAL
  Filled 2016-03-18: qty 1

## 2016-03-18 MED ORDER — EPINEPHRINE PF 1 MG/ML IJ SOLN
INTRAMUSCULAR | Status: DC | PRN
  Administered 2016-03-18: 450 mL

## 2016-03-18 MED ORDER — MIDAZOLAM HCL 2 MG/2ML IJ SOLN
INTRAMUSCULAR | Status: AC
  Filled 2016-03-18: qty 2

## 2016-03-18 MED ORDER — ONDANSETRON HCL 4 MG/2ML IJ SOLN: 4.0000 mg | Freq: Four times a day (QID) | INTRAMUSCULAR | Status: DC | PRN

## 2016-03-18 MED ORDER — OXYCODONE HCL 5 MG PO TABS
5.0000 mg | ORAL_TABLET | ORAL | Status: DC | PRN
  Administered 2016-03-18 (×2): 10 mg via ORAL
  Filled 2016-03-18 (×2): qty 2

## 2016-03-18 MED ORDER — CHLORHEXIDINE GLUCONATE CLOTH 2 % EX PADS: 6.0000 | MEDICATED_PAD | Freq: Once | CUTANEOUS | Status: DC

## 2016-03-18 MED ORDER — MIDAZOLAM HCL 2 MG/2ML IJ SOLN
1.0000 mg | INTRAMUSCULAR | Status: DC | PRN
  Administered 2016-03-18: 2 mg via INTRAVENOUS

## 2016-03-18 MED ORDER — CEFAZOLIN SODIUM-DEXTROSE 2-4 GM/100ML-% IV SOLN
INTRAVENOUS | Status: AC
Start: 2016-03-18 — End: 2016-03-18
  Filled 2016-03-18: qty 100

## 2016-03-18 MED ORDER — CELECOXIB 200 MG PO CAPS
ORAL_CAPSULE | ORAL | Status: AC
  Filled 2016-03-18: qty 2

## 2016-03-18 MED ORDER — HYDROMORPHONE HCL 1 MG/ML IJ SOLN: 0.5000 mg | INTRAMUSCULAR | Status: DC | PRN

## 2016-03-18 MED ORDER — ONDANSETRON 4 MG PO TBDP: 4.0000 mg | ORAL_TABLET | Freq: Four times a day (QID) | ORAL | Status: DC | PRN

## 2016-03-18 MED ORDER — ROCURONIUM BROMIDE 100 MG/10ML IV SOLN
INTRAVENOUS | Status: DC | PRN
  Administered 2016-03-18: 50 mg via INTRAVENOUS

## 2016-03-18 MED ORDER — SUGAMMADEX SODIUM 200 MG/2ML IV SOLN
INTRAVENOUS | Status: DC | PRN
  Administered 2016-03-18: 200 mg via INTRAVENOUS

## 2016-03-18 MED ORDER — METHOCARBAMOL 500 MG PO TABS: 500.0000 mg | ORAL_TABLET | Freq: Three times a day (TID) | ORAL | 0 refills | Status: DC | PRN

## 2016-03-18 SURGICAL SUPPLY — 77 items
BAG DECANTER FOR FLEXI CONT (MISCELLANEOUS) ×3 IMPLANT
BINDER ABDOMINAL 10 UNV 27-48 (MISCELLANEOUS) IMPLANT
BINDER ABDOMINAL 12 SM 30-45 (SOFTGOODS) ×3 IMPLANT
BINDER BREAST LRG (GAUZE/BANDAGES/DRESSINGS) IMPLANT
BINDER BREAST MEDIUM (GAUZE/BANDAGES/DRESSINGS) IMPLANT
BINDER BREAST XLRG (GAUZE/BANDAGES/DRESSINGS) ×3 IMPLANT
BINDER BREAST XXLRG (GAUZE/BANDAGES/DRESSINGS) IMPLANT
BLADE SURG 10 STRL SS (BLADE) ×6 IMPLANT
BLADE SURG 11 STRL SS (BLADE) ×3 IMPLANT
BNDG GAUZE ELAST 4 BULKY (GAUZE/BANDAGES/DRESSINGS) ×6 IMPLANT
CANISTER LIPO FAT HARVEST (MISCELLANEOUS) ×5 IMPLANT
CANISTER SUCT 1200ML W/VALVE (MISCELLANEOUS) ×7 IMPLANT
CHLORAPREP W/TINT 26ML (MISCELLANEOUS) ×3 IMPLANT
COVER BACK TABLE 60X90IN (DRAPES) ×3 IMPLANT
COVER MAYO STAND STRL (DRAPES) ×3 IMPLANT
DECANTER SPIKE VIAL GLASS SM (MISCELLANEOUS) IMPLANT
DRAIN CHANNEL 15F RND FF W/TCR (WOUND CARE) IMPLANT
DRAPE TOP ARMCOVERS (MISCELLANEOUS) ×3 IMPLANT
DRAPE U-SHAPE 76X120 STRL (DRAPES) ×3 IMPLANT
DRSG PAD ABDOMINAL 8X10 ST (GAUZE/BANDAGES/DRESSINGS) ×12 IMPLANT
ELECT BLADE 4.0 EZ CLEAN MEGAD (MISCELLANEOUS) ×3
ELECT COATED BLADE 2.86 ST (ELECTRODE) ×3 IMPLANT
ELECT REM PT RETURN 9FT ADLT (ELECTROSURGICAL) ×3
ELECTRODE BLDE 4.0 EZ CLN MEGD (MISCELLANEOUS) ×1 IMPLANT
ELECTRODE REM PT RTRN 9FT ADLT (ELECTROSURGICAL) ×1 IMPLANT
EVACUATOR SILICONE 100CC (DRAIN) IMPLANT
FILTER LIPOSUCTION (MISCELLANEOUS) ×3 IMPLANT
GLOVE BIO SURGEON STRL SZ 6 (GLOVE) ×6 IMPLANT
GLOVE BIOGEL PI IND STRL 7.0 (GLOVE) ×2 IMPLANT
GLOVE BIOGEL PI INDICATOR 7.0 (GLOVE) ×4
GLOVE ECLIPSE 6.5 STRL STRAW (GLOVE) ×6 IMPLANT
GOWN STRL REUS W/ TWL LRG LVL3 (GOWN DISPOSABLE) ×3 IMPLANT
GOWN STRL REUS W/TWL LRG LVL3 (GOWN DISPOSABLE) ×9
IMPL BREAST INSPI SRX 700CC (Breast) ×2 IMPLANT
IMPLANT BREAST INSPI SRX 700CC (Breast) ×6 IMPLANT
IV NS 500ML (IV SOLUTION)
IV NS 500ML BAXH (IV SOLUTION) IMPLANT
KIT FILL SYSTEM UNIVERSAL (SET/KITS/TRAYS/PACK) IMPLANT
LINER CANISTER 1000CC FLEX (MISCELLANEOUS) ×3 IMPLANT
LIQUID BAND (GAUZE/BANDAGES/DRESSINGS) ×6 IMPLANT
NDL HYPO 25X1 1.5 SAFETY (NEEDLE) IMPLANT
NDL SAFETY ECLIPSE 18X1.5 (NEEDLE) ×1 IMPLANT
NEEDLE HYPO 18GX1.5 SHARP (NEEDLE) ×3
NEEDLE HYPO 25X1 1.5 SAFETY (NEEDLE) IMPLANT
NS IRRIG 1000ML POUR BTL (IV SOLUTION) ×3 IMPLANT
PACK BASIN DAY SURGERY FS (CUSTOM PROCEDURE TRAY) ×3 IMPLANT
PAD ALCOHOL SWAB (MISCELLANEOUS) ×3 IMPLANT
PENCIL BUTTON HOLSTER BLD 10FT (ELECTRODE) ×3 IMPLANT
PIN SAFETY STERILE (MISCELLANEOUS) ×3 IMPLANT
SHEET MEDIUM DRAPE 40X70 STRL (DRAPES) ×6 IMPLANT
SIZER BREAST REUSE XFP 650CC (SIZER) ×3
SIZER BREAST REUSE XFP 700CC (SIZER) ×3
SIZER BRST REUSE XFP 650CC (SIZER) IMPLANT
SIZER BRST REUSE XFP 700CC (SIZER) IMPLANT
SLEEVE SCD COMPRESS KNEE MED (MISCELLANEOUS) ×3 IMPLANT
SPONGE LAP 18X18 X RAY DECT (DISPOSABLE) ×9 IMPLANT
STAPLER VISISTAT 35W (STAPLE) ×3 IMPLANT
SUT ETHILON 2 0 FS 18 (SUTURE) IMPLANT
SUT MNCRL AB 4-0 PS2 18 (SUTURE) ×6 IMPLANT
SUT PDS AB 2-0 CT2 27 (SUTURE) IMPLANT
SUT VIC AB 3-0 PS1 18 (SUTURE) ×3
SUT VIC AB 3-0 PS1 18XBRD (SUTURE) IMPLANT
SUT VIC AB 3-0 SH 27 (SUTURE) ×6
SUT VIC AB 3-0 SH 27X BRD (SUTURE) ×2 IMPLANT
SUT VICRYL 4-0 PS2 18IN ABS (SUTURE) ×6 IMPLANT
SYR 10ML LL (SYRINGE) ×9 IMPLANT
SYR 50ML LL SCALE MARK (SYRINGE) ×12 IMPLANT
SYR BULB IRRIGATION 50ML (SYRINGE) ×6 IMPLANT
SYR CONTROL 10ML LL (SYRINGE) IMPLANT
SYR TB 1ML LL NO SAFETY (SYRINGE) ×3 IMPLANT
TOWEL OR 17X24 6PK STRL BLUE (TOWEL DISPOSABLE) ×8 IMPLANT
TUBE CONNECTING 20'X1/4 (TUBING) ×2
TUBE CONNECTING 20X1/4 (TUBING) ×4 IMPLANT
TUBING INFILTRATION IT-10001 (TUBING) ×3 IMPLANT
TUBING SET GRADUATE ASPIR 12FT (MISCELLANEOUS) ×3 IMPLANT
UNDERPAD 30X30 (UNDERPADS AND DIAPERS) ×6 IMPLANT
YANKAUER SUCT BULB TIP NO VENT (SUCTIONS) ×3 IMPLANT

## 2016-03-18 NOTE — Progress Notes (Signed)
MD informed RN to instruct patient to take home HCTZ medication.  Patient has not filled this medication yet, RN instructed patient to fill medication and begin taking once home tomorrow, 1/13th.

## 2016-03-18 NOTE — Progress Notes (Signed)
First hemacue 7.1 result invalid; retested with 12.6 result.

## 2016-03-18 NOTE — Interval H&P Note (Signed)
History and Physical Interval Note:  03/18/2016 9:30 AM  Alejandra Harmon  has presented today for surgery, with the diagnosis of aquired absents breast PLB2 mutation  The various methods of treatment have been discussed with the patient and family. After consideration of risks, benefits and other options for treatment, the patient has consented to  Removal bilateral tissue expanders and placement silicone implants, lipofilling from abdomen to bilateral chest, possible acellular dermis to right chest as a surgical intervention .  The patient's history has been reviewed, patient examined, no change in status, stable for surgery.  I have reviewed the patient's chart and labs.  Questions were answered to the patient's satisfaction.     Ajdin Macke

## 2016-03-18 NOTE — Anesthesia Postprocedure Evaluation (Signed)
Anesthesia Post Note  Patient: Alejandra Harmon  Procedure(s) Performed: Procedure(s) (LRB): REMOVAL OF BILATERAL TISSUE EXPANDERS WITH PLACEMENT OF BILATERAL BREAST IMPLANTS (Bilateral) LIPOFILLING from abdomen to bilateral chest (Bilateral)  Patient location during evaluation: PACU Anesthesia Type: General Level of consciousness: awake and alert Pain management: pain level controlled Vital Signs Assessment: post-procedure vital signs reviewed and stable Respiratory status: spontaneous breathing, nonlabored ventilation, respiratory function stable and patient connected to nasal cannula oxygen Cardiovascular status: blood pressure returned to baseline and stable Postop Assessment: no signs of nausea or vomiting Anesthetic complications: no       Last Vitals:  Vitals:   03/18/16 1345 03/18/16 1430  BP: (!) 158/85 (!) 163/89  Pulse: (!) 52 (!) 56  Resp: 18 18  Temp:  36.4 C    Last Pain:  Vitals:   03/18/16 1604  TempSrc:   PainSc: 3                  Catalina Gravel

## 2016-03-18 NOTE — Anesthesia Preprocedure Evaluation (Addendum)
Anesthesia Evaluation  Patient identified by MRN, date of birth, ID band Patient awake    Reviewed: Allergy & Precautions, NPO status , Patient's Chart, lab work & pertinent test results  Airway Mallampati: II  TM Distance: >3 FB Neck ROM: Full    Dental  (+) Teeth Intact, Dental Advisory Given   Pulmonary sleep apnea ,    Pulmonary exam normal breath sounds clear to auscultation       Cardiovascular hypertension, (-) angina(-) CAD, (-) Past MI and (-) CHF Normal cardiovascular exam Rhythm:Regular Rate:Normal     Neuro/Psych PSYCHIATRIC DISORDERS Anxiety Depression  Neuromuscular disease    GI/Hepatic Neg liver ROS, GERD  ,  Endo/Other  Obesity   Renal/GU negative Renal ROS     Musculoskeletal negative musculoskeletal ROS (+)   Abdominal   Peds  Hematology  (+) Blood dyscrasia, anemia ,   Anesthesia Other Findings Day of surgery medications reviewed with the patient.  Breast cancer s/p mastectomies  Reproductive/Obstetrics                            Anesthesia Physical Anesthesia Plan  ASA: II  Anesthesia Plan: General   Post-op Pain Management:    Induction: Intravenous  Airway Management Planned: Oral ETT  Additional Equipment:   Intra-op Plan:   Post-operative Plan: Extubation in OR  Informed Consent: I have reviewed the patients History and Physical, chart, labs and discussed the procedure including the risks, benefits and alternatives for the proposed anesthesia with the patient or authorized representative who has indicated his/her understanding and acceptance.   Dental advisory given  Plan Discussed with: CRNA  Anesthesia Plan Comments: (Risks/benefits of general anesthesia discussed with patient including risk of damage to teeth, lips, gum, and tongue, nausea/vomiting, allergic reactions to medications, and the possibility of heart attack, stroke and death.  All  patient questions answered.  Patient wishes to proceed.)        Anesthesia Quick Evaluation

## 2016-03-18 NOTE — Progress Notes (Signed)
Surgery Center does not carry HCTZ, notified MD office, left a message with RN.  Patient has not started this medication at home yet either, MD aware of that as well.

## 2016-03-18 NOTE — Transfer of Care (Signed)
Immediate Anesthesia Transfer of Care Note  Patient: Alejandra Harmon  Procedure(s) Performed: Procedure(s) (LRB): REMOVAL OF BILATERAL TISSUE EXPANDERS WITH PLACEMENT OF BILATERAL BREAST IMPLANTS (Bilateral) LIPOFILLING from abdomen to bilateral chest (Bilateral)  Patient Location: PACU  Anesthesia Type: General  Level of Consciousness: awake, oriented, sedated and patient cooperative  Airway & Oxygen Therapy: Patient Spontanous Breathing and Patient connected to face mask oxygen  Post-op Assessment: Report given to PACU RN and Post -op Vital signs reviewed and stable  Post vital signs: Reviewed and stable  Complications: No apparent anesthesia complications

## 2016-03-18 NOTE — Anesthesia Procedure Notes (Signed)
Procedure Name: Intubation Performed by: Terrance Mass Pre-anesthesia Checklist: Patient identified, Emergency Drugs available, Suction available and Patient being monitored Patient Re-evaluated:Patient Re-evaluated prior to inductionOxygen Delivery Method: Circle system utilized Preoxygenation: Pre-oxygenation with 100% oxygen Intubation Type: IV induction Ventilation: Mask ventilation without difficulty Laryngoscope Size: Miller and 2 Grade View: Grade II Tube type: Oral Tube size: 7.0 mm Number of attempts: 1 Airway Equipment and Method: Stylet and Oral airway Placement Confirmation: ETT inserted through vocal cords under direct vision,  positive ETCO2 and breath sounds checked- equal and bilateral Secured at: 23 cm Tube secured with: Tape Dental Injury: Teeth and Oropharynx as per pre-operative assessment

## 2016-03-18 NOTE — Op Note (Signed)
Operative Note   DATE OF OPERATION: 1.12.18  LOCATION: [facility]-observation  SURGICAL DIVISION: Plastic Surgery  PREOPERATIVE DIAGNOSES:  1. Acquired absence breasts 2.PALB2 genetic mutation  POSTOPERATIVE DIAGNOSES:  same  PROCEDURE:  1. Removal bilateral tissue expanders and placement silicone implants 2. Lipofilling to bilateral chest  SURGEON: [name] MD MBA  ASSISTANT: none  ANESTHESIA:  General.   EBL: 50 ml  COMPLICATIONS: None immediate.   INDICATIONS FOR PROCEDURE:  The patient, Alejandra Harmon, is a 56 y.o. female born on 08-Sep-1960, is here for implant exchange following bilateral nipple sparing mastectomies and immediate expander, acellular dermis reconstruction.   FINDINGS: Well incorporated acellular dermis left chest. 50 ml fat infiltrated each side over right and left chest. Natrelle Inspira Smooth Round Extra Projection 700 ml implants placed bilateral, REF SRX-700 RIGHT [id number] LEFT SN [id number].  DESCRIPTION OF PROCEDURE:  The patient's operative site was marked with the patient in the preoperative area to mark chest midline, sternal notch, desired anterior axillary line and supra and infraumbilical abdomen for fat harvest. The patientwas taken to the operating room. SCDs were placed and IV antibiotics were given. The patient's operative site was prepped and draped in a sterile fashion. A time out was performed and all information was confirmed to be correct.Incision made in right inframammary mastectomy scar carried to through capsule. Expander removed. There was no ADM in place as this was removed at a prior surgery. The width of the capsule was sufficient for anticipated implant with no lateral displacement of expander and I deferred replacement of acellular dermis. Capsulotomies performed medially and superiorly with additional scoring of anterior surface of capsule. Sizer placed.  I then directed attention to left chest and incision made  in inframammary scar. Expander removed. Fully incorporated ADM noted. Capsulotomies performed medially and superiorly with additional scoring of anterior surface of capsule.  Sizer placed. The patient brought to upright sitting position and assessed for symmetry. A Natrelle Extra Projection 700 ml implant selected. Patient returned to supine position.   Stab incision made over bilateral lateral abdomen and tumescent fluid infiltrated over supra and infra umbilical abdomen,total 921 ml tumescent infiltrated. Power assisted liposuction performed to endpoint symmetric contour and soft tissue thickness. Total 300 cc lipoaspirate. The fat was then washed and prepared by gravity for infiltration. Harvested fat was then infiltrated in subcutaneous and intramuscular planes throughout bilateral superior poles.   At this time right breast cavity irrigated with solution containing Ancef, gentamycin and bacitracin, followed by Betadine. Implant placed in right breast cavity. Closure was completed with running 3-0 vicryl for approximation of capsule and superficial fascia. 4-0 vicryl was placed in dermis and running 4-0 monocryl was used to close skin. Following irrigation left chest cavity, hemostasis ensured. Implant placed in cavity and following and proper orientation ensured. Closure completed in similar fashion. Tissue adhesive applied to breast incisions. The abdominal incisions were approximated with 4-0 monocryl. Dry dressing and abdominal and breast binders applied.   The patient was allowed to wake from anesthesia, extubated and taken to the recovery room in satisfactory condition.   SPECIMENS:none  DRAINS: none  [name], MD [facility] Plastic & Reconstructive Surgery [phone], pin [phone]

## 2016-03-19 DIAGNOSIS — Z421 Encounter for breast reconstruction following mastectomy: Secondary | ICD-10-CM | POA: Diagnosis not present

## 2016-03-22 ENCOUNTER — Encounter (HOSPITAL_BASED_OUTPATIENT_CLINIC_OR_DEPARTMENT_OTHER): Payer: Self-pay | Admitting: Plastic Surgery

## 2016-04-25 ENCOUNTER — Other Ambulatory Visit: Payer: Self-pay | Admitting: Oncology

## 2016-04-26 ENCOUNTER — Encounter: Payer: Self-pay | Admitting: *Deleted

## 2016-04-26 ENCOUNTER — Ambulatory Visit: Payer: BLUE CROSS/BLUE SHIELD | Attending: Otolaryngology | Admitting: *Deleted

## 2016-04-26 DIAGNOSIS — R498 Other voice and resonance disorders: Secondary | ICD-10-CM | POA: Diagnosis not present

## 2016-04-26 NOTE — Therapy (Signed)
[facility] [address] [place], [place], [address] Phone: [phone]   Fax:  [phone]  Speech Language Pathology Evaluation  Patient Details  Name: Alejandra Harmon MRN: [id number] Date of Birth: Jun 23, 1960 Referring Provider: Dr. Lucia Gaskins  Encounter Date: 04/26/2016      End of Session - 04/26/16 1312    Visit Number 1   Number of Visits 18   Date for SLP Re-Evaluation 06/24/16   SLP Start Time 1015   SLP Stop Time  1115   SLP Time Calculation (min) 60 min   Activity Tolerance Patient tolerated treatment well      Past Medical History:  Diagnosis Date  . Allergic rhinitis   . Cancer ([place]) 12/2015   breast cancer  . Cyst of ovary   . History of esophageal reflux   . Mild sleep apnea    STUDY 07-27-2009-- POSITIONAL / REM AFFECT---  RECOMMENDATION LOOSE WT.  Marland Kitchen Pelvic pain in female   . Right thyroid nodule   . Sciatica of left side   . Wears contact lenses     Past Surgical History:  Procedure Laterality Date  . ABDOMINAL HYSTERECTOMY    . BREAST ENHANCEMENT SURGERY Bilateral 12-16-2008  . BREAST RECONSTRUCTION WITH PLACEMENT OF TISSUE EXPANDER AND FLEX HD (ACELLULAR HYDRATED DERMIS) Bilateral 01/05/2016  . BREAST RECONSTRUCTION WITH PLACEMENT OF TISSUE EXPANDER AND FLEX HD (ACELLULAR HYDRATED DERMIS) Bilateral 01/05/2016   Procedure: BILATERAL BREAST RECONSTRUCTION WITH PLACEMENT OF TISSUE EXPANDERS AND CORTIVA TISSUE;  Surgeon: [name], MD;  Location: [facility];  Service: Plastics;  Laterality: Bilateral;  . CESAREAN SECTION  1994  . COLONOSCOPY  last one 12-23-2013  . ELBOW ULNAR NERVE DECOMPRESSION Right 05-13-2003  . EXCISION BENIGN BREAST CYST  1980  . LAPAROSCOPIC ASSISTED VAGINAL HYSTERECTOMY N/A 02/26/2015   Procedure: LAPAROSCOPIC ASSISTED VAGINAL HYSTERECTOMY ;  Surgeon: Everlene Farrier, MD;  Location: [facility];  Service: Gynecology;  Laterality: N/A;  . LAPAROSCOPIC  BILATERAL SALPINGO OOPHERECTOMY Bilateral 02/26/2015   Procedure: LAPAROSCOPIC BILATERAL SALPINGO OOPHORECTOMY;  Surgeon: Everlene Farrier, MD;  Location: [facility];  Service: Gynecology;  Laterality: Bilateral;  . LAPAROSCOPIC CHOLECYSTECTOMY  1998  . LAPAROSCOPY/  LYSIS ADHESIONS/  RIGHT OVARY CYSTECTOMY/  ALBATION ENDOMETRIOSIS/  CHROMOPERTUBATION  02-18-2004  . LIPOSUCTION WITH LIPOFILLING Bilateral 03/18/2016   Procedure: LIPOFILLING from abdomen to bilateral chest;  Surgeon: [name], MD;  Location: [facility];  Service: Plastics;  Laterality: Bilateral;  . MASTECTOMY Bilateral 01/05/2016   PROPHYLATIC NIPPLE SPARING MASTECTOMY   . NIPPLE SPARING MASTECTOMY Bilateral 01/05/2016   Procedure: BILATERAL PROPHYLATIC NIPPLE SPARING MASTECTOMIES;  Surgeon: Rolm Bookbinder, MD;  Location: [facility];  Service: General;  Laterality: Bilateral;  . REMOVAL OF BILATERAL TISSUE EXPANDERS WITH PLACEMENT OF BILATERAL BREAST IMPLANTS Bilateral 03/18/2016   Procedure: REMOVAL OF BILATERAL TISSUE EXPANDERS WITH PLACEMENT OF BILATERAL BREAST IMPLANTS;  Surgeon: [name], MD;  Location: [facility];  Service: Plastics;  Laterality: Bilateral;  . TISSUE EXPANDER PLACEMENT Right 01/24/2016   Procedure: REMOVAL AND PLACEMENT OF TISSUE EXPANDER/RIGHT;  Surgeon: [name], MD;  Location: [facility];  Service: Plastics;  Laterality: Right;  . TISSUE EXPANDER REMOVAL Right 01/24/2016  . TRANSTHORACIC ECHOCARDIOGRAM  07-07-2009   grade 1 diastolic dysfunction, ef 42-35%/  mild AR and TR/  trivial MR    There were no vitals filed for this visit.      Subjective Assessment - 04/26/16 1257    Subjective "I  just want to get my voice back"   Patient is accompained by: --  fiance Mark   Currently in Pain? No/denies   Multiple Pain Sites No            SLP Evaluation [facility] - 04/26/16 1257      SLP Visit Information   SLP Received On 04/26/16    Referring Provider Dr. Ezzard Standing     Subjective   Subjective --  Pt reports she wants her voice back   Patient/Family Stated Goal improvement in voice quality     General Information   HPI 56 year old female referred for outpatien ST evaluation for ongoing hoarseness for several months. PMH significant for multiple surgeries with intubation (October 2017, November 2017, January 2018. See above for additional information on PMH.   Behavioral/Cognition WFL   Mobility Status Ambulatory without device     Prior Functional Status   Cognitive/Linguistic Baseline Within functional limits   Vocation Self employed  sales calls, presentations, speeches, consultations     Cognition   Overall Cognitive Status Within Functional Limits for tasks assessed     Verbal Expression   Overall Verbal Expression Appears within functional limits for tasks assessed     Oral Motor/Sensory Function   Overall Oral Motor/Sensory Function Appears within functional limits for tasks assessed     Motor Speech   Overall Motor Speech Impaired   Respiration Impaired  clavicular breathing noted.   Phonation Hoarse   Resonance Within functional limits   Articulation Within functional limitis   Intelligibility Intelligible   Motor Planning Witnin functional limits   Motor Speech Errors Not applicable   Phonation Impaired   Vocal Abuses Habitual Hyperphonia;Habitual Cough/Throat Clear;Prolonged Vocal Use;Vocal Fold Dehydration   Tension Present Neck   Volume Loud   Pitch High intermittently     Plan   Duration Other (comment)  8 weeks                         SLP Education - 04/26/16 1311    Education provided Yes   Education Details voice conservation, voice handicap index   Person(s) Educated Patient   Methods Explanation;Demonstration;Handout   Comprehension Verbalized understanding;Verbal cues required;Need further instruction          SLP Short Term Goals - 04/26/16 1434       SLP SHORT TERM GOAL #1   Title Pt will verbalize 3-5 vocal hygiene strategies independently   Time 4   Period Weeks   Status New     SLP SHORT TERM GOAL #2   Title Pt will report use of 3-5 vocal hygiene strategies between therapies for 3 sessions   Time 4   Period Weeks   Status New              SLP Long Term Goals - 04/26/16 1448      SLP LONG TERM GOAL #1   Title Pt will verbalize 7+ voice conservation/hygiene strategies independently   Time 8   Period Weeks   Status New     SLP LONG TERM GOAL #2   Title Pt will report use of 7+ voice conservation/hygiene strategies between sessions for 3 sessions   Time 8   Period Weeks   Status New     SLP LONG TERM GOAL #3   Title Pt will show improvement in s:z ratio and length/quality of MPT   Baseline 1.6 (04/26/16), /a/=8.3, quality change after 4 sec, /i/  16.4, quality change after 15 seconds   Time 8   Period Weeks   Status New     SLP LONG TERM GOAL #4   Title Pt will show improvement in Voice Handicap Index   Baseline 71 (04/30/16)   Time 8   Period Weeks   Status New          Plan - 04-30-2016 1313    Clinical Impression Statement Alejandra [facility] na) is a 56 year old female referred for outpatient speech therapy evaluation due to ongoing hoarseness for several months. .   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 She reports no pain, and only seasonal allergies. She does take omeprazole for reflux.  No soreness or vocal fatigue, dryness is present, hoarseness is worse in the morning. No recent upper respiratory infections, allergic reactions or nasal drip.  Pt was noted to clear her throat multiple times during this evaluation.   VOCAL HYGIENE: 3-4 glasses of water a day, juice, 1 cup of coffee ~3x/week, no alcohol or smoking.   VOCAL ACTIVITIES: telephone with speakerphone, talking  1:1, in noisy settings, and in large groups daily for several hours. Pt reports she used to yell frequently. Now just raises her voice at the dog. Throat clearing frequently, especially at night per pt. Coughing occasional, not daily. No exposure to smoke, chemicals, allergens or temperature changes. Pt with reflux: on PPI x3 months. Symptoms include burning esophagus, but now resolved per pt. 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   ORAL MOTOR ASSESMENT: WFL   LARYNGEAL PERFORMANCE: s/z ratio 1.6, MPT /a/ 8.3 sec with deterioration of voice quality after 4 seconds. /i/ 16.4 sec with clear voice quality until after 15 seconds. Tension reported in neck area. Pt voice quality is consistently hoarse, with frequent jumps in pitch (except during sustained phonation of /i/). Pt had difficulty changing loudness without changing pitch, and vice versa. Overall, pt speaks and laughs more loudly than necessary in a quiet environment.  BREATH SUPPORT: clavicular breathing noted at rest, during assessment tasks, and during conversation.  VOICE HANDICAP INDEX: 71  RECOMMENDATIONS: increase awareness of loudness and pitch and changes in them, talk as if you are speaking to someone you don't want to frighten (soft, not whispering), complete diaphragmatic breathing exercises, increase consumption of water daily, follow behavioral and dietary strategies for management of esophageal dysmotility, and continue taking PPI.    Speech Therapy Frequency 2x / week   Duration --  8 weeks   Treatment/Interventions Internal/external aids;Patient/family education;Multimodal communcation approach;Compensatory strategies;Functional tasks;Compensatory techniques;SLP instruction and feedback;Environmental controls   Potential to Achieve Goals Good   Potential Considerations Ability to learn/carryover information;Family/community  support;Cooperation/participation level   Consulted and Agree with Plan of Care Patient      Patient will benefit from skilled therapeutic intervention in order to improve the following deficits and impairments:   Other voice and resonance disorders (CODE)      G-Codes - 04/30/16 1450    Functional Assessment Tool Used asha noms, clinical judgment   Functional Limitations Voice   Voice Current Status (G9171) At least 60 percent but less than 80 percent impaired, limited or restricted   Voice Goal Status (G9172) At least 20 percent but less than 40 percent impaired, limited or restricted  Problem List Patient Active Problem List   Diagnosis Date Noted  . Elevated blood pressure reading 02/19/2016  . Anemia 02/19/2016  . Acquired absence of breast and nipple 01/24/2016  . At high risk for breast cancer 01/05/2016  . 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 Endometriosis 02/26/2015  . Cough 11/03/2014  . Genetic testing 09/23/2014  . Obesity 06/19/2014  . Hoarseness 10/03/2013  . Left knee pain 07/03/2013  . Breast pain in female 01/22/2013  . Breast lump on right side at 8 o'clock position 04/24/2012  . TIA (transient ischemic attack) 12/17/2010  . Impaired glucose tolerance 12/17/2010  . Preventative health care 06/02/2010  . GERD 08/31/2009  . OBSTRUCTIVE SLEEP APNEA 08/19/2009  . BREAST HYPERTROPHY 10/30/2008  . BACK PAIN 10/30/2008  . ANXIETY 07/03/2007  . Depression 07/03/2007  . ALLERGIC RHINITIS 07/03/2007   Alejandra Harmon, Alejandra Harmon, Alejandra Harmon  Alejandra Harmon 04/26/2016, 3:03 PM  [facility] [address] [place], [place], [address] Phone: [phone]   Fax:  [phone]  Name: Alejandra Harmon MRN: [id number] Date of Birth: 1960/12/04

## 2016-04-26 NOTE — Patient Instructions (Signed)
Pt given information regarding the following:  1. Voice conservation Program 2. S/Z ratio of 1.6 3. Avg MPT /a/ = 8.3 sec, /i/= 16.4 sec 4. Voice Handicap Index of 71 5. Instructions on diaphragmatic breathing 6. Loudness and pitch are mutually exclusive 7. Independent Goal setting - observation, weekly goal, method to achieve, self appraisal, improvement, next week's goal 8. Given history of reflux, information provided on behavioral and dietary management of esophageal dysmotility.

## 2016-05-05 ENCOUNTER — Ambulatory Visit: Payer: BLUE CROSS/BLUE SHIELD | Attending: Otolaryngology | Admitting: Speech Pathology

## 2016-05-05 DIAGNOSIS — R498 Other voice and resonance disorders: Secondary | ICD-10-CM | POA: Insufficient documentation

## 2016-05-05 NOTE — Patient Instructions (Signed)
  Be aware of clearing your throat - stop this- it becomes a habit - Elta Guadeloupe can   When you feel like you have to clear your throat: Swallow hard, or sip and swallow hard or forceful breath and swallow hard  When you feel like your throat is tightening up or you feel like you can't breathe, belly sniff and blow  Use you diaphragm breath if you have to project to power your voice  If you feel strain in your throat - it is time for a big breath  Try some vocal rest - when you have talked for a long time, rest your voice with no talking in the evening

## 2016-05-05 NOTE — Therapy (Signed)
[facility] [address] [place], [place], [address] Phone: [phone]   Fax:  [phone]  Speech Language Pathology Treatment  Patient Details  Name: Alejandra Harmon MRN: [id number] Date of Birth: 1960/07/18 Referring Provider: Dr. Lucia Gaskins  Encounter Date: 05/05/2016      End of Session - 05/05/16 1406    Visit Number 2   Number of Visits 18   Date for SLP Re-Evaluation 06/24/16   SLP Start Time 1316   SLP Stop Time  1401   SLP Time Calculation (min) 45 min   Activity Tolerance Patient tolerated treatment well      Past Medical History:  Diagnosis Date  . Allergic rhinitis   . Cancer ([facility]) 12/2015   breast cancer  . Cyst of ovary   . History of esophageal reflux   . Mild sleep apnea    STUDY 07-27-2009-- POSITIONAL / REM AFFECT---  RECOMMENDATION LOOSE WT.  Marland Kitchen Pelvic pain in female   . Right thyroid nodule   . Sciatica of left side   . Wears contact lenses     Past Surgical History:  Procedure Laterality Date  . ABDOMINAL HYSTERECTOMY    . BREAST ENHANCEMENT SURGERY Bilateral 12-16-2008  . BREAST RECONSTRUCTION WITH PLACEMENT OF TISSUE EXPANDER AND FLEX HD (ACELLULAR HYDRATED DERMIS) Bilateral 01/05/2016  . BREAST RECONSTRUCTION WITH PLACEMENT OF TISSUE EXPANDER AND FLEX HD (ACELLULAR HYDRATED DERMIS) Bilateral 01/05/2016   Procedure: BILATERAL BREAST RECONSTRUCTION WITH PLACEMENT OF TISSUE EXPANDERS AND CORTIVA TISSUE;  Surgeon: [name], MD;  Location: [facility];  Service: Plastics;  Laterality: Bilateral;  . CESAREAN SECTION  1994  . COLONOSCOPY  last one 12-23-2013  . ELBOW ULNAR NERVE DECOMPRESSION Right 05-13-2003  . EXCISION BENIGN BREAST CYST  1980  . LAPAROSCOPIC ASSISTED VAGINAL HYSTERECTOMY N/A 02/26/2015   Procedure: LAPAROSCOPIC ASSISTED VAGINAL HYSTERECTOMY ;  Surgeon: Everlene Farrier, MD;  Location: [facility];  Service: Gynecology;  Laterality: N/A;  . LAPAROSCOPIC  BILATERAL SALPINGO OOPHERECTOMY Bilateral 02/26/2015   Procedure: LAPAROSCOPIC BILATERAL SALPINGO OOPHORECTOMY;  Surgeon: Everlene Farrier, MD;  Location: [facility];  Service: Gynecology;  Laterality: Bilateral;  . LAPAROSCOPIC CHOLECYSTECTOMY  1998  . LAPAROSCOPY/  LYSIS ADHESIONS/  RIGHT OVARY CYSTECTOMY/  ALBATION ENDOMETRIOSIS/  CHROMOPERTUBATION  02-18-2004  . LIPOSUCTION WITH LIPOFILLING Bilateral 03/18/2016   Procedure: LIPOFILLING from abdomen to bilateral chest;  Surgeon: [name], MD;  Location: [facility];  Service: Plastics;  Laterality: Bilateral;  . MASTECTOMY Bilateral 01/05/2016   PROPHYLATIC NIPPLE SPARING MASTECTOMY   . NIPPLE SPARING MASTECTOMY Bilateral 01/05/2016   Procedure: BILATERAL PROPHYLATIC NIPPLE SPARING MASTECTOMIES;  Surgeon: Rolm Bookbinder, MD;  Location: [facility];  Service: General;  Laterality: Bilateral;  . REMOVAL OF BILATERAL TISSUE EXPANDERS WITH PLACEMENT OF BILATERAL BREAST IMPLANTS Bilateral 03/18/2016   Procedure: REMOVAL OF BILATERAL TISSUE EXPANDERS WITH PLACEMENT OF BILATERAL BREAST IMPLANTS;  Surgeon: [name], MD;  Location: [facility];  Service: Plastics;  Laterality: Bilateral;  . TISSUE EXPANDER PLACEMENT Right 01/24/2016   Procedure: REMOVAL AND PLACEMENT OF TISSUE EXPANDER/RIGHT;  Surgeon: [name], MD;  Location: [facility];  Service: Plastics;  Laterality: Right;  . TISSUE EXPANDER REMOVAL Right 01/24/2016  . TRANSTHORACIC ECHOCARDIOGRAM  07-07-2009   grade 1 diastolic dysfunction, ef 27-25%/  mild AR and TR/  trivial MR    There were no vitals filed for this visit.             ADULT SLP TREATMENT -  05/05/16 1358      General Information   Behavior/Cognition Alert;Cooperative;Pleasant mood     Treatment Provided   Treatment provided Cognitive-Linquistic     Pain Assessment   Pain Assessment No/denies pain     Cognitive-Linquistic Treatment   Treatment focused on  Voice   Skilled Treatment Pt focused on confidential voice since last session and reports some imrprovement in her voice. This session focused on abdmonimal breathing, more frequent breathing during speech, espcially when Pt hears strain voice or feels strain in her throat. Pt noted to clear her throat 7x in 1st half of session. Trained her in throat clear alternative and to increase awareness of clearing her throat. Pt reports tightness, feeling like she can't breath and her throat is closing up at times, possible VCD sx. Trained pt in sniff/blow to alleiviate this feeling. Throat clearing likely contributing to this. Pt utilized confidential voice, frequent abdmonimal breathing in simple conversation with occasional min A.      Assessment / Recommendations / Plan   Plan Continue with current plan of care     Progression Toward Goals   Progression toward goals Progressing toward goals          SLP Education - 05/05/16 1401    Education provided Yes   Education Details reduce throat clears, throat clear alternatives, confidential voice, vocal rest   Person(s) Educated Patient  significant other, Elta Guadeloupe   Methods Explanation;Demonstration;Verbal cues;Handout   Comprehension Verbalized understanding;Verbal cues required;Returned demonstration          SLP Short Term Goals - 05/05/16 1405      SLP SHORT TERM GOAL #1   Title Pt will verbalize 3-5 vocal hygiene strategies independently   Time 4   Period Weeks   Status On-going     SLP SHORT TERM GOAL #2   Title Pt will report use of 3-5 vocal hygiene strategies between therapies for 3 sessions   Time 4   Period Weeks   Status On-going     SLP SHORT TERM GOAL #4   Status New          SLP Long Term Goals - 05/05/16 1405      SLP LONG TERM GOAL #1   Title Pt will verbalize 7+ voice conservation/hygiene strategies independently   Time 8   Period Weeks   Status On-going     SLP LONG TERM GOAL #2   Title Pt will report use  of 7+ voice conservation/hygiene strategies between sessions for 3 sessions   Time 8   Period Weeks   Status On-going     SLP LONG TERM GOAL #3   Title Pt will show improvement in s:z ratio and length/quality of MPT   Baseline 1.6 (04/26/16), /a/=8.3, quality change after 4 sec, /i/ 16.4, quality change after 15 seconds   Time 8   Period Weeks   Status On-going     SLP LONG TERM GOAL #4   Title Pt will show improvement in Voice Handicap Index   Baseline 71 (04/26/16)   Time 8   Period Weeks   Status On-going          Plan - 05/05/16 1403    Clinical Impression Statement Pt utilized abdmonimal breathing and confidential voice with min A this session. Focused on reducing throat clears and practicing throat clear alternatives. Pt reportingn some VCD like symptoms - trained pt in sniff blow to help allivieate this. Continue skilled ST to carryover vocal hygiene and optimize [facility]  phonation.    Speech Therapy Frequency 2x / week   Treatment/Interventions Internal/external aids;Patient/family education;Multimodal communcation approach;Compensatory strategies;Functional tasks;Compensatory techniques;SLP instruction and feedback;Environmental controls   Potential to Achieve Goals Good   Potential Considerations Ability to learn/carryover information;Family/community support;Cooperation/participation level   Consulted and Agree with Plan of Care Patient      Patient will benefit from skilled therapeutic intervention in order to improve the following deficits and impairments:   Other voice and resonance disorders (CODE)    Problem List Patient Active Problem List   Diagnosis Date Noted  . Elevated blood pressure reading 02/19/2016  . Anemia 02/19/2016  . Acquired absence of breast and nipple 01/24/2016  . At high risk for breast cancer 01/05/2016  . 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 Endometriosis 02/26/2015  . Cough 11/03/2014  . Genetic testing 09/23/2014  . Obesity  06/19/2014  . Hoarseness 10/03/2013  . Left knee pain 07/03/2013  . Breast pain in female 01/22/2013  . Breast lump on right side at 8 o'clock position 04/24/2012  . TIA (transient ischemic attack) 12/17/2010  . Impaired glucose tolerance 12/17/2010  . Preventative health care 06/02/2010  . GERD 08/31/2009  . OBSTRUCTIVE SLEEP APNEA 08/19/2009  . BREAST HYPERTROPHY 10/30/2008  . BACK PAIN 10/30/2008  . ANXIETY 07/03/2007  . Depression 07/03/2007  . ALLERGIC RHINITIS 07/03/2007    Alejandra Harmon, Annye Rusk  MS, CCC-SLP 05/05/2016, 2:06 PM  [facility] [address] [place], [place], [address] Phone: [phone]   Fax:  [phone]   Name: Alejandra Harmon MRN: [id number] Date of Birth: Aug 23, 1960

## 2016-05-06 ENCOUNTER — Ambulatory Visit: Payer: BLUE CROSS/BLUE SHIELD | Admitting: *Deleted

## 2016-05-06 DIAGNOSIS — R498 Other voice and resonance disorders: Secondary | ICD-10-CM

## 2016-05-06 NOTE — Therapy (Signed)
[facility] [address] [place], [place], [address] Phone: [phone]   Fax:  [phone]  Speech Language Pathology Treatment  Patient Details  Name: Alejandra Harmon MRN: [id number] Date of Birth: [birth year] Referring Provider: Dr. Ezzard Standing  Encounter Date: 05/06/2016      End of Session - 05/06/16 1245    Visit Number 3   Number of Visits 18   Date for SLP Re-Evaluation 06/24/16   SLP Start Time 1200   SLP Stop Time  1235   SLP Time Calculation (min) 35 min   Activity Tolerance Patient tolerated treatment well      Past Medical History:  Diagnosis Date  . Allergic rhinitis   . Cancer (HCC) 12/2015   breast cancer  . Cyst of ovary   . History of esophageal reflux   . Mild sleep apnea    STUDY 07-27-2009-- POSITIONAL / REM AFFECT---  RECOMMENDATION LOOSE WT.  Marland Kitchen Pelvic pain in female   . Right thyroid nodule   . Sciatica of left side   . Wears contact lenses     Past Surgical History:  Procedure Laterality Date  . ABDOMINAL HYSTERECTOMY    . BREAST ENHANCEMENT SURGERY Bilateral 12-16-2008  . BREAST RECONSTRUCTION WITH PLACEMENT OF TISSUE EXPANDER AND FLEX HD (ACELLULAR HYDRATED DERMIS) Bilateral 01/05/2016  . BREAST RECONSTRUCTION WITH PLACEMENT OF TISSUE EXPANDER AND FLEX HD (ACELLULAR HYDRATED DERMIS) Bilateral 01/05/2016   Procedure: BILATERAL BREAST RECONSTRUCTION WITH PLACEMENT OF TISSUE EXPANDERS AND CORTIVA TISSUE;  Surgeon: Glenna Fellows, MD;  [facility]: [facility];  Service: Plastics;  Laterality: Bilateral;  . CESAREAN SECTION  1994  . COLONOSCOPY  last one 12-23-2013  . ELBOW ULNAR NERVE DECOMPRESSION Right 05-13-2003  . EXCISION BENIGN BREAST CYST  1980  . LAPAROSCOPIC ASSISTED VAGINAL HYSTERECTOMY N/A 02/26/2015   Procedure: LAPAROSCOPIC ASSISTED VAGINAL HYSTERECTOMY ;  Surgeon: [name], MD;  Location: [facility];  Service: Gynecology;  Laterality: N/A;  . LAPAROSCOPIC  BILATERAL SALPINGO OOPHERECTOMY Bilateral 02/26/2015   Procedure: LAPAROSCOPIC BILATERAL SALPINGO OOPHORECTOMY;  Surgeon: [name], MD;  Location: [facility];  Service: Gynecology;  Laterality: Bilateral;  . LAPAROSCOPIC CHOLECYSTECTOMY  1998  . LAPAROSCOPY/  LYSIS ADHESIONS/  RIGHT OVARY CYSTECTOMY/  ALBATION ENDOMETRIOSIS/  CHROMOPERTUBATION  02-18-2004  . LIPOSUCTION WITH LIPOFILLING Bilateral 03/18/2016   Procedure: LIPOFILLING from abdomen to bilateral chest;  Surgeon: Glenna Fellows, MD;  Location: [facility];  Service: Plastics;  Laterality: Bilateral;  . MASTECTOMY Bilateral 01/05/2016   PROPHYLATIC NIPPLE SPARING MASTECTOMY   . NIPPLE SPARING MASTECTOMY Bilateral 01/05/2016   Procedure: BILATERAL PROPHYLATIC NIPPLE SPARING MASTECTOMIES;  Surgeon: Emelia Loron, MD;  Location: [facility] OR;  Service: General;  Laterality: Bilateral;  . REMOVAL OF BILATERAL TISSUE EXPANDERS WITH PLACEMENT OF BILATERAL BREAST IMPLANTS Bilateral 03/18/2016   Procedure: REMOVAL OF BILATERAL TISSUE EXPANDERS WITH PLACEMENT OF BILATERAL BREAST IMPLANTS;  Surgeon: Glenna Fellows, MD;  Location: [facility];  Service: Plastics;  Laterality: Bilateral;  . TISSUE EXPANDER PLACEMENT Right 01/24/2016   Procedure: REMOVAL AND PLACEMENT OF TISSUE EXPANDER/RIGHT;  Surgeon: Glenna Fellows, MD;  Location: [facility];  Service: Plastics;  Laterality: Right;  . TISSUE EXPANDER REMOVAL Right 01/24/2016  . TRANSTHORACIC ECHOCARDIOGRAM  07-07-2009   grade 1 diastolic dysfunction, ef 50-55%/  mild AR and TR/  trivial MR    There were no vitals filed for this visit.      Subjective Assessment - 05/06/16 1201    Subjective "things  are going good" "My voice was much better until I yelled at [facility] in the car on the way over here"   Patient is accompained by: Family member  Loraine Leriche   Currently in Pain? No/denies               ADULT SLP TREATMENT - 05/06/16 0001       General Information   Behavior/Cognition Alert;Cooperative;Pleasant mood     Treatment Provided   Treatment provided Cognitive-Linquistic     Pain Assessment   Pain Assessment No/denies pain     Cognitive-Linquistic Treatment   Treatment focused on Voice;Patient/family/caregiver education   Skilled Treatment Skilled ST session targeted review of confidential voice and abdominal breathing exercises. Pt was given written information on VCD, as discussed with SLP last visit. Pt reported her voice was much better this morning, until she raised her voice to [place] in the car driving over. SLP and pt discussed alternatives to raising her voice. These were provided in written form. Of note, pt volume was markedly lower today, with pt reporting she was trying to use her "confidential voice" more frequently. Pt also returned weekly goals sheet with observations, goals, and strategies.     Assessment / Recommendations / Plan   Plan Continue with current plan of care     Progression Toward Goals   Progression toward goals Progressing toward goals          SLP Education - 05/06/16 1244    Education provided Yes   Education Details alternatives to raising voice, VCD, diaphragmatic breathing   Person(s) Educated Patient  Loraine Leriche   Methods Explanation;Demonstration;Verbal cues;Handout   Comprehension Verbalized understanding;Returned demonstration;Verbal cues required          SLP Short Term Goals - 05/06/16 1247      SLP SHORT TERM GOAL #1   Title Pt will verbalize 3-5 vocal hygiene strategies independently   Time 4   Period Weeks   Status On-going     SLP SHORT TERM GOAL #2   Title Pt will report use of 3-5 vocal hygiene strategies between therapies for 3 sessions   Time 4   Period Weeks   Status On-going          SLP Long Term Goals - 05/06/16 1247      SLP LONG TERM GOAL #1   Title Pt will verbalize 7+ voice conservation/hygiene strategies independently   Time 8   Period  Weeks   Status On-going     SLP LONG TERM GOAL #2   Title Pt will report use of 7+ voice conservation/hygiene strategies between sessions for 3 sessions   Time 8   Period Weeks   Status On-going     SLP LONG TERM GOAL #3   Title Pt will show improvement in s:z ratio and length/quality of MPT   Baseline 1.6 (04/26/16), /a/=8.3, quality change after 4 sec, /i/ 16.4, quality change after 15 seconds   Time 8   Period Weeks   Status On-going     SLP LONG TERM GOAL #4   Title Pt will show improvement in Voice Handicap Index   Baseline 71 (04/26/16)   Time 8   Period Weeks   Status On-going          Plan - 05/06/16 1245    Clinical Impression Statement Pt exhibiting improvement this session, as evidenced by decreased volume in quiet environment. Pt motivated to improve vocal quality, and is receptive to suggestions and willing to set goals  for herself. Continued ST is recommended, targeting improved vocal hygiene and quality.   Speech Therapy Frequency 2x / week   Duration --  8 weeks   Treatment/Interventions Internal/external aids;Patient/family education;Multimodal communcation approach;Compensatory strategies;Functional tasks;Compensatory techniques;SLP instruction and feedback;Environmental controls   Potential to Achieve Goals Good   Potential Considerations Ability to learn/carryover information;Family/community support;Cooperation/participation level   SLP Home Exercise Plan reviewed and provided   Consulted and Agree with Plan of Care Patient  and S.O. Elta [place]      Patient will benefit from skilled therapeutic intervention in order to improve the following deficits and impairments:   Other voice and resonance disorders (CODE)    Problem List Patient Active Problem List   Diagnosis Date Noted  . Elevated blood pressure reading 02/19/2016  . Anemia 02/19/2016  . Acquired absence of breast and nipple 01/24/2016  . At high risk for breast cancer 01/05/2016  . Monoallelic  mutation of PALB2 gene 09/17/2015  . Endometriosis 02/26/2015  . Cough 11/03/2014  . Genetic testing 09/23/2014  . Obesity 06/19/2014  . Hoarseness 10/03/2013  . Left knee pain 07/03/2013  . Breast pain in female 01/22/2013  . Breast lump on right side at 8 o'clock position 04/24/2012  . TIA (transient ischemic attack) 12/17/2010  . Impaired glucose tolerance 12/17/2010  . Preventative health care 06/02/2010  . GERD 08/31/2009  . OBSTRUCTIVE SLEEP APNEA 08/19/2009  . BREAST HYPERTROPHY 10/30/2008  . BACK PAIN 10/30/2008  . ANXIETY 07/03/2007  . Depression 07/03/2007  . ALLERGIC RHINITIS 07/03/2007   Antonious Omahoney B. Zion, MSP, CCC-SLP  Shonna Chock 05/06/2016, 12:48 PM  [facility] [address] [place], [place], [address] Phone: [phone]   Fax:  [phone]   Name: PAISLEE SZATKOWSKI MRN: [id number] Date of Birth: 1960-10-03

## 2016-05-06 NOTE — Patient Instructions (Signed)
Alternatives to yelling/raising your voice  1. Plan ahead - don't allow yourself to run out of time   2. Say nothing, let it go  3. Breathe - slow deep inhale, exhale - diaphragmatic breaths  4. Count to 5 or 10  5. Use a nonvocal noise (whistle), or quiet gesture  6. Go to your happy place  7. If discussion is needed, keep it calm - remove emotion.  8. Take a drink of water

## 2016-05-10 ENCOUNTER — Ambulatory Visit: Payer: BLUE CROSS/BLUE SHIELD | Admitting: Speech Pathology

## 2016-05-10 DIAGNOSIS — R498 Other voice and resonance disorders: Secondary | ICD-10-CM

## 2016-05-10 NOTE — Therapy (Signed)
[facility] [address] [place], [place], [address] Phone: [phone]   Fax:  [phone]  Speech Language Pathology Treatment  Patient Details  Name: Alejandra Harmon MRN: [id number] Date of Birth: Mar 01, 1961 Referring Provider: Dr. Lucia Gaskins  Encounter Date: 05/10/2016      End of Session - 05/10/16 0934    Visit Number 4   Number of Visits 18   Date for SLP Re-Evaluation 06/24/16   SLP Start Time 0855   SLP Stop Time  0930   SLP Time Calculation (min) 35 min      Past Medical History:  Diagnosis Date  . Allergic rhinitis   . Cancer ([place]) 12/2015   breast cancer  . Cyst of ovary   . History of esophageal reflux   . Mild sleep apnea    STUDY 07-27-2009-- POSITIONAL / REM AFFECT---  RECOMMENDATION LOOSE WT.  Marland Kitchen Pelvic pain in female   . Right thyroid nodule   . Sciatica of left side   . Wears contact lenses     Past Surgical History:  Procedure Laterality Date  . ABDOMINAL HYSTERECTOMY    . BREAST ENHANCEMENT SURGERY Bilateral 12-16-2008  . BREAST RECONSTRUCTION WITH PLACEMENT OF TISSUE EXPANDER AND FLEX HD (ACELLULAR HYDRATED DERMIS) Bilateral 01/05/2016  . BREAST RECONSTRUCTION WITH PLACEMENT OF TISSUE EXPANDER AND FLEX HD (ACELLULAR HYDRATED DERMIS) Bilateral 01/05/2016   Procedure: BILATERAL BREAST RECONSTRUCTION WITH PLACEMENT OF TISSUE EXPANDERS AND CORTIVA TISSUE;  Surgeon: [name], MD;  Location: [facility];  Service: Plastics;  Laterality: Bilateral;  . CESAREAN SECTION  1994  . COLONOSCOPY  last one 12-23-2013  . ELBOW ULNAR NERVE DECOMPRESSION Right 05-13-2003  . EXCISION BENIGN BREAST CYST  1980  . LAPAROSCOPIC ASSISTED VAGINAL HYSTERECTOMY N/A 02/26/2015   Procedure: LAPAROSCOPIC ASSISTED VAGINAL HYSTERECTOMY ;  Surgeon: Everlene Farrier, MD;  Location: [facility];  Service: Gynecology;  Laterality: N/A;  . LAPAROSCOPIC BILATERAL SALPINGO OOPHERECTOMY Bilateral 02/26/2015   Procedure: LAPAROSCOPIC BILATERAL SALPINGO OOPHORECTOMY;  Surgeon: Everlene Farrier, MD;  Location: [facility];  Service: Gynecology;  Laterality: Bilateral;  . LAPAROSCOPIC CHOLECYSTECTOMY  1998  . LAPAROSCOPY/  LYSIS ADHESIONS/  RIGHT OVARY CYSTECTOMY/  ALBATION ENDOMETRIOSIS/  CHROMOPERTUBATION  02-18-2004  . LIPOSUCTION WITH LIPOFILLING Bilateral 03/18/2016   Procedure: LIPOFILLING from abdomen to bilateral chest;  Surgeon: [name], MD;  Location: [facility];  Service: Plastics;  Laterality: Bilateral;  . MASTECTOMY Bilateral 01/05/2016   PROPHYLATIC NIPPLE SPARING MASTECTOMY   . NIPPLE SPARING MASTECTOMY Bilateral 01/05/2016   Procedure: BILATERAL PROPHYLATIC NIPPLE SPARING MASTECTOMIES;  Surgeon: Rolm Bookbinder, MD;  Location: [facility];  Service: General;  Laterality: Bilateral;  . REMOVAL OF BILATERAL TISSUE EXPANDERS WITH PLACEMENT OF BILATERAL BREAST IMPLANTS Bilateral 03/18/2016   Procedure: REMOVAL OF BILATERAL TISSUE EXPANDERS WITH PLACEMENT OF BILATERAL BREAST IMPLANTS;  Surgeon: [name], MD;  Location: [facility];  Service: Plastics;  Laterality: Bilateral;  . TISSUE EXPANDER PLACEMENT Right 01/24/2016   Procedure: REMOVAL AND PLACEMENT OF TISSUE EXPANDER/RIGHT;  Surgeon: [name], MD;  Location: [facility];  Service: Plastics;  Laterality: Right;  . TISSUE EXPANDER REMOVAL Right 01/24/2016  . TRANSTHORACIC ECHOCARDIOGRAM  07-07-2009   grade 1 diastolic dysfunction, ef 50-09%/  mild AR and TR/  trivial MR    There were no vitals filed for this visit.      Subjective Assessment - 05/10/16 0857    Subjective "I'm late because I was up late and draggin this  morning"   Currently in Pain? No/denies               ADULT SLP TREATMENT - 05/10/16 0857      General Information   Behavior/Cognition Alert;Cooperative;Pleasant mood     Treatment Provided   Treatment provided Cognitive-Linquistic     Pain  Assessment   Pain Assessment No/denies pain     Cognitive-Linquistic Treatment   Treatment focused on Voice;Patient/family/caregiver education   Skilled Treatment Pt  came in with loud voice and hoarseness - pt reports over did karaoke.- 5 songs over the weekend. Noted to have several throat clearing episodes today. Reports being on the phone for 3 hours for work yesterday.  Facilitated abdominal breathing and confidential voice with occasional min A. Sustained /a/ with cues for breath support averaged 11.36, s/z ratio .85 - improved from inital evaluation. Pt to use external reminder on computer to use good breath support and confidential voice on the phone.     Assessment / Recommendations / Plan   Plan Continue with current plan of care     Progression Toward Goals   Progression toward goals Progressing toward goals          SLP Education - 05/10/16 0931    Education provided Yes   Education Details no singing, no yelling - must be cosistent with reflux meds   Person(s) Educated Patient   Methods Explanation;Demonstration;Verbal cues;Handout   Comprehension Verbalized understanding;Returned demonstration;Verbal cues required          SLP Short Term Goals - 05/10/16 0933      SLP SHORT TERM GOAL #1   Title Pt will verbalize 3-5 vocal hygiene strategies independently   Time 3   Period Weeks   Status On-going     SLP SHORT TERM GOAL #2   Title Pt will report use of 3-5 vocal hygiene strategies between therapies for 3 sessions   Time 3   Period Weeks   Status On-going          SLP Long Term Goals - 05/10/16 0933      SLP LONG TERM GOAL #1   Title Pt will verbalize 7+ voice conservation/hygiene strategies independently   Time 7   Period Weeks   Status On-going     SLP LONG TERM GOAL #2   Title Pt will report use of 7+ voice conservation/hygiene strategies between sessions for 3 sessions   Time 7   Period Weeks   Status On-going     SLP LONG TERM GOAL #3    Title Pt will show improvement in s:z ratio and length/quality of MPT   Baseline 1.6 (04/26/16), /a/=8.3, quality change after 4 sec, /i/ 16.4, quality change after 15 seconds; 05/09/16 - /a/ 11.36 seconds; s:z .85   Time 7   Period Weeks   Status On-going     SLP LONG TERM GOAL #4   Title Pt will show improvement in Voice Handicap Index   Baseline 71 (04/26/16)   Time 7   Period Weeks   Status On-going          Plan - 05/10/16 0932    Clinical Impression Statement Continue skilled ST to maximize carryover of voice conservation/hygiene and WNL phonation.    Speech Therapy Frequency 2x / week   Treatment/Interventions Internal/external aids;Patient/family education;Multimodal communcation approach;Compensatory strategies;Functional tasks;Compensatory techniques;SLP instruction and feedback;Environmental controls   Potential to Achieve Goals Good   Potential Considerations Ability to learn/carryover information;Family/community support;Cooperation/participation level   Consulted and Agree  with Plan of Care Patient      Patient will benefit from skilled therapeutic intervention in order to improve the following deficits and impairments:   Other voice and resonance disorders (CODE)    Problem List Patient Active Problem List   Diagnosis Date Noted  . Elevated blood pressure reading 02/19/2016  . Anemia 02/19/2016  . Acquired absence of breast and nipple 01/24/2016  . At high risk for breast cancer 01/05/2016  . 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 Endometriosis 02/26/2015  . Cough 11/03/2014  . Genetic testing 09/23/2014  . Obesity 06/19/2014  . Hoarseness 10/03/2013  . Left knee pain 07/03/2013  . Breast pain in female 01/22/2013  . Breast lump on right side at 8 o'clock position 04/24/2012  . TIA (transient ischemic attack) 12/17/2010  . Impaired glucose tolerance 12/17/2010  . Preventative health care 06/02/2010  . GERD 08/31/2009  . OBSTRUCTIVE SLEEP APNEA  08/19/2009  . BREAST HYPERTROPHY 10/30/2008  . BACK PAIN 10/30/2008  . ANXIETY 07/03/2007  . Depression 07/03/2007  . ALLERGIC RHINITIS 07/03/2007    Leilany Digeronimo, Annye Rusk  MS, CCC-SLP 05/10/2016, 9:35 AM  [facility] [address] [place], [place], [address] Phone: [phone]   Fax:  [phone]   Name: Alejandra Harmon MRN: [id number] Date of Birth: 10/31/1960

## 2016-05-10 NOTE — Patient Instructions (Signed)
  Use a sticky note/sticker on your computer to remind yourself to take big breaths and take your voice down a little - confidential voice  No Karaoke No yelling  When you talk for 3 hours - rest voice for 3 hours - no talking   Throat Coat - Traditional Medicinals tea - [employer]  Thayer's lozenges - [employer] - yellow tin  When doing forceful blow instead of throat clears - no voice or sound with this  Be consistent with reflux medication - we want to reduce any reflux acid that may go up to your throat

## 2016-05-13 ENCOUNTER — Ambulatory Visit: Payer: BLUE CROSS/BLUE SHIELD | Admitting: *Deleted

## 2016-05-13 DIAGNOSIS — R498 Other voice and resonance disorders: Secondary | ICD-10-CM

## 2016-05-13 NOTE — Therapy (Signed)
[facility] [address] [place], [place], [address] Phone: [phone]   Fax:  [phone]  Speech Language Pathology Treatment  Patient Details  Name: Alejandra Harmon MRN: [id number] Date of Birth: 1961-01-25 Referring Provider: Dr. Ezzard Standing  Encounter Date: 05/13/2016      End of Session - 05/13/16 1057    Visit Number 5   Number of Visits 18   Date for SLP Re-Evaluation 06/24/16   SLP Start Time 0930   SLP Stop Time  1015   SLP Time Calculation (min) 45 min   Activity Tolerance Patient tolerated treatment well      Past Medical History:  Diagnosis Date  . Allergic rhinitis   . Cancer (HCC) 12/2015   breast cancer  . Cyst of ovary   . History of esophageal reflux   . Mild sleep apnea    STUDY 07-27-2009-- POSITIONAL / REM AFFECT---  RECOMMENDATION LOOSE WT.  Marland Kitchen Pelvic pain in female   . Right thyroid nodule   . Sciatica of left side   . Wears contact lenses     Past Surgical History:  Procedure Laterality Date  . ABDOMINAL HYSTERECTOMY    . BREAST ENHANCEMENT SURGERY Bilateral 12-16-2008  . BREAST RECONSTRUCTION WITH PLACEMENT OF TISSUE EXPANDER AND FLEX HD (ACELLULAR HYDRATED DERMIS) Bilateral 01/05/2016  . BREAST RECONSTRUCTION WITH PLACEMENT OF TISSUE EXPANDER AND FLEX HD (ACELLULAR HYDRATED DERMIS) Bilateral 01/05/2016   Procedure: BILATERAL BREAST RECONSTRUCTION WITH PLACEMENT OF TISSUE EXPANDERS AND CORTIVA TISSUE;  Surgeon: Glenna Fellows, MD;  [facility];  Service: Plastics;  Laterality: Bilateral;  . CESAREAN SECTION  1994  . COLONOSCOPY  last one 12-23-2013  . ELBOW ULNAR NERVE DECOMPRESSION Right 05-13-2003  . EXCISION BENIGN BREAST CYST  1980  . LAPAROSCOPIC ASSISTED VAGINAL HYSTERECTOMY N/A 02/26/2015   Procedure: LAPAROSCOPIC ASSISTED VAGINAL HYSTERECTOMY ;  Surgeon: [name], MD;  Location: [facility];  Service: Gynecology;  Laterality: N/A;  . LAPAROSCOPIC  BILATERAL SALPINGO OOPHERECTOMY Bilateral 02/26/2015   Procedure: LAPAROSCOPIC BILATERAL SALPINGO OOPHORECTOMY;  Surgeon: [name], MD;  Location: [facility];  Service: Gynecology;  Laterality: Bilateral;  . LAPAROSCOPIC CHOLECYSTECTOMY  1998  . LAPAROSCOPY/  LYSIS ADHESIONS/  RIGHT OVARY CYSTECTOMY/  ALBATION ENDOMETRIOSIS/  CHROMOPERTUBATION  02-18-2004  . LIPOSUCTION WITH LIPOFILLING Bilateral 03/18/2016   Procedure: LIPOFILLING from abdomen to bilateral chest;  Surgeon: Glenna Fellows, MD;  Location: [facility];  Service: Plastics;  Laterality: Bilateral;  . MASTECTOMY Bilateral 01/05/2016   PROPHYLATIC NIPPLE SPARING MASTECTOMY   . NIPPLE SPARING MASTECTOMY Bilateral 01/05/2016   Procedure: BILATERAL PROPHYLATIC NIPPLE SPARING MASTECTOMIES;  Surgeon: Emelia Loron, MD;  Location: [facility] OR;  Service: General;  Laterality: Bilateral;  . REMOVAL OF BILATERAL TISSUE EXPANDERS WITH PLACEMENT OF BILATERAL BREAST IMPLANTS Bilateral 03/18/2016   Procedure: REMOVAL OF BILATERAL TISSUE EXPANDERS WITH PLACEMENT OF BILATERAL BREAST IMPLANTS;  Surgeon: Glenna Fellows, MD;  Location: [facility];  Service: Plastics;  Laterality: Bilateral;  . TISSUE EXPANDER PLACEMENT Right 01/24/2016   Procedure: REMOVAL AND PLACEMENT OF TISSUE EXPANDER/RIGHT;  Surgeon: Glenna Fellows, MD;  [facility];  Service: Plastics;  Laterality: Right;  . TISSUE EXPANDER REMOVAL Right 01/24/2016  . TRANSTHORACIC ECHOCARDIOGRAM  07-07-2009   grade 1 diastolic dysfunction, ef 50-55%/  mild AR and TR/  trivial MR    There were no vitals filed for this visit.      Subjective Assessment - 05/13/16 0936    Subjective I  couldn't resist Karaoke. I won't be doing that again.   Patient is accompained by: Loraine Leriche   Currently in Pain? No/denies               ADULT SLP TREATMENT - 05/13/16 0001      General Information   Behavior/Cognition  Alert;Cooperative;Pleasant mood     Treatment Provided   Treatment provided Cognitive-Linquistic     Pain Assessment   Pain Assessment No/denies pain     Cognitive-Linquistic Treatment   Treatment focused on Voice;Patient/family/caregiver education   Skilled Treatment Session targeted review of vocal hygiene strategies. Pt able to verbalize 5 vocal hygiene strategies independently, and reports using 3 primarily - diaphragmatic breathing, confidential voice, and not talking over people. Pt does not report significantly increased intake of water since evaluation, and so was encouraged to do so. Pt tends to increase loudness when she is excited about something, and changes pitch abruptly when laughing. She was encouraged to do periodic "self-check" for loudness level, and try to minimize pitch changes when laughing, as this increases tension within the vocal folds.  Pt sustained /a/ = 10.01, /i/ = 13.01, s/z ratio 1.2, which is an improvement from initial evaluation of 1.6. Pt was provided with weekly goal sheet to fill out, and was encouraged to continue working on last goal sheet. Pt was strongly encouraged to have vocal rest when she does not need to talk, and to avoid karaoke, which she reports she feels she is still recovering from.     Assessment / Recommendations / Plan   Plan Continue with current plan of care     Progression Toward Goals   Progression toward goals Progressing toward goals          SLP Education - 05/13/16 1056    Education provided Yes   Education Details complete new goal sheet, increase water intake.    Person(s) Educated Patient  Loraine Leriche   Methods Explanation;Demonstration;Verbal cues   Comprehension Verbalized understanding;Returned demonstration;Need further instruction;Verbal cues required          SLP Short Term Goals - 05/13/16 1057      SLP SHORT TERM GOAL #1   Title Pt will verbalize 3-5 vocal hygiene strategies independently   Status Achieved      SLP SHORT TERM GOAL #2   Title Pt will report use of 3-5 vocal hygiene strategies between therapies for 3 sessions   Baseline 05/13/16   Time 3   Period Weeks   Status On-going          SLP Long Term Goals - 05/13/16 1058      SLP LONG TERM GOAL #1   Title Pt will verbalize 7+ voice conservation/hygiene strategies independently   Time 7   Period Weeks   Status On-going     SLP LONG TERM GOAL #2   Title Pt will report use of 7+ voice conservation/hygiene strategies between sessions for 3 sessions   Time 7   Period Weeks   Status On-going     SLP LONG TERM GOAL #3   Title Pt will show improvement in s:z ratio and length/quality of MPT   Baseline 1.6 (04/26/16), /a/=8.3, quality change after 4 sec, /i/ 16.4, quality change after 15 seconds; 05/09/16 - /a/ 11.36 seconds; s:z .85   Time 7   Period Weeks   Status On-going     SLP LONG TERM GOAL #4   Title Pt will show improvement in Voice Handicap Index   Baseline  71 (04/26/16)   Time 7   Period Weeks   Status On-going          Plan - 05/13/16 1057    Clinical Impression Statement Continue skilled ST to maximize carryover of voice conservation/hygiene and WNL phonation.    Speech Therapy Frequency 2x / week   Duration --  8 weeks   Treatment/Interventions Internal/external aids;Patient/family education;Multimodal communcation approach;Compensatory strategies;Functional tasks;Compensatory techniques;SLP instruction and feedback;Environmental controls   Potential to Achieve Goals Good   Potential Considerations Ability to learn/carryover information;Family/community support;Cooperation/participation level   SLP Home Exercise Plan reviewed and provided   Consulted and Agree with Plan of Care Patient      Patient will benefit from skilled therapeutic intervention in order to improve the following deficits and impairments:   Other voice and resonance disorders (CODE)    Problem List Patient Active Problem List    Diagnosis Date Noted  . Elevated blood pressure reading 02/19/2016  . Anemia 02/19/2016  . Acquired absence of breast and nipple 01/24/2016  . At high risk for breast cancer 01/05/2016  . 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 Endometriosis 02/26/2015  . Cough 11/03/2014  . Genetic testing 09/23/2014  . Obesity 06/19/2014  . Hoarseness 10/03/2013  . Left knee pain 07/03/2013  . Breast pain in female 01/22/2013  . Breast lump on right side at 8 o'clock position 04/24/2012  . TIA (transient ischemic attack) 12/17/2010  . Impaired glucose tolerance 12/17/2010  . Preventative health care 06/02/2010  . GERD 08/31/2009  . OBSTRUCTIVE SLEEP APNEA 08/19/2009  . BREAST HYPERTROPHY 10/30/2008  . BACK PAIN 10/30/2008  . ANXIETY 07/03/2007  . Depression 07/03/2007  . ALLERGIC RHINITIS 07/03/2007   Celia B. [place], MSP, CCC-SLP  Shonna Chock 05/13/2016, 11:00 AM  [facility] [address] [place], [place], [address] Phone: [phone]   Fax:  [phone]   Name: Alejandra Harmon MRN: [id number] Date of Birth: 08-04-1960

## 2016-05-17 ENCOUNTER — Ambulatory Visit: Payer: BLUE CROSS/BLUE SHIELD | Admitting: Speech Pathology

## 2016-05-20 ENCOUNTER — Ambulatory Visit: Payer: BLUE CROSS/BLUE SHIELD | Admitting: *Deleted

## 2016-05-20 DIAGNOSIS — R498 Other voice and resonance disorders: Secondary | ICD-10-CM | POA: Diagnosis not present

## 2016-05-20 NOTE — Therapy (Signed)
[facility] [address] [place], [place], [address] Phone: [phone]   Fax:  [phone]  Speech Language Pathology Treatment  Patient Details  Name: Alejandra Harmon MRN: [id number] Date of Birth: 08-10-1960 Referring Provider: Dr. Lucia Gaskins  Encounter Date: 05/20/2016      End of Session - 05/20/16 1516    Visit Number 6   Number of Visits 18   Date for SLP Re-Evaluation 06/24/16   SLP Start Time 1430   SLP Stop Time  1515   SLP Time Calculation (min) 45 min   Activity Tolerance Patient tolerated treatment well      Past Medical History:  Diagnosis Date  . Allergic rhinitis   . Cancer ([place]) 12/2015   breast cancer  . Cyst of ovary   . History of esophageal reflux   . Mild sleep apnea    STUDY 07-27-2009-- POSITIONAL / REM AFFECT---  RECOMMENDATION LOOSE WT.  Marland Kitchen Pelvic pain in female   . Right thyroid nodule   . Sciatica of left side   . Wears contact lenses     Past Surgical History:  Procedure Laterality Date  . ABDOMINAL HYSTERECTOMY    . BREAST ENHANCEMENT SURGERY Bilateral 12-16-2008  . BREAST RECONSTRUCTION WITH PLACEMENT OF TISSUE EXPANDER AND FLEX HD (ACELLULAR HYDRATED DERMIS) Bilateral 01/05/2016  . BREAST RECONSTRUCTION WITH PLACEMENT OF TISSUE EXPANDER AND FLEX HD (ACELLULAR HYDRATED DERMIS) Bilateral 01/05/2016   Procedure: BILATERAL BREAST RECONSTRUCTION WITH PLACEMENT OF TISSUE EXPANDERS AND CORTIVA TISSUE;  Surgeon: [name], MD;  Location: [facility];  Service: Plastics;  Laterality: Bilateral;  . CESAREAN SECTION  1994  . COLONOSCOPY  last one 12-23-2013  . ELBOW ULNAR NERVE DECOMPRESSION Right 05-13-2003  . EXCISION BENIGN BREAST CYST  1980  . LAPAROSCOPIC ASSISTED VAGINAL HYSTERECTOMY N/A 02/26/2015   Procedure: LAPAROSCOPIC ASSISTED VAGINAL HYSTERECTOMY ;  Surgeon: Everlene Farrier, MD;  Location: [facility];  Service: Gynecology;  Laterality: N/A;  . LAPAROSCOPIC  BILATERAL SALPINGO OOPHERECTOMY Bilateral 02/26/2015   Procedure: LAPAROSCOPIC BILATERAL SALPINGO OOPHORECTOMY;  Surgeon: Everlene Farrier, MD;  Location: [facility];  Service: Gynecology;  Laterality: Bilateral;  . LAPAROSCOPIC CHOLECYSTECTOMY  1998  . LAPAROSCOPY/  LYSIS ADHESIONS/  RIGHT OVARY CYSTECTOMY/  ALBATION ENDOMETRIOSIS/  CHROMOPERTUBATION  02-18-2004  . LIPOSUCTION WITH LIPOFILLING Bilateral 03/18/2016   Procedure: LIPOFILLING from abdomen to bilateral chest;  Surgeon: [name], MD;  Location: [facility];  Service: Plastics;  Laterality: Bilateral;  . MASTECTOMY Bilateral 01/05/2016   PROPHYLATIC NIPPLE SPARING MASTECTOMY   . NIPPLE SPARING MASTECTOMY Bilateral 01/05/2016   Procedure: BILATERAL PROPHYLATIC NIPPLE SPARING MASTECTOMIES;  Surgeon: Rolm Bookbinder, MD;  Location: [facility];  Service: General;  Laterality: Bilateral;  . REMOVAL OF BILATERAL TISSUE EXPANDERS WITH PLACEMENT OF BILATERAL BREAST IMPLANTS Bilateral 03/18/2016   Procedure: REMOVAL OF BILATERAL TISSUE EXPANDERS WITH PLACEMENT OF BILATERAL BREAST IMPLANTS;  Surgeon: [name], MD;  Location: [facility];  Service: Plastics;  Laterality: Bilateral;  . TISSUE EXPANDER PLACEMENT Right 01/24/2016   Procedure: REMOVAL AND PLACEMENT OF TISSUE EXPANDER/RIGHT;  Surgeon: [name], MD;  Location: [facility];  Service: Plastics;  Laterality: Right;  . TISSUE EXPANDER REMOVAL Right 01/24/2016  . TRANSTHORACIC ECHOCARDIOGRAM  07-07-2009   grade 1 diastolic dysfunction, ef 92-42%/  mild AR and TR/  trivial MR    There were no vitals filed for this visit.      Subjective Assessment - 05/20/16 1432    Subjective I  think I'm on the road to recovery   Patient is accompained by: --  Elta Guadeloupe   Currently in Pain? No/denies               ADULT SLP TREATMENT - 05/20/16 0001      General Information   Behavior/Cognition Alert;Cooperative;Pleasant mood      Treatment Provided   Treatment provided Cognitive-Linquistic     Pain Assessment   Pain Assessment No/denies pain     Cognitive-Linquistic Treatment   Treatment focused on Voice;Patient/family/caregiver education   Skilled Treatment Skilled ST session target review of voice conservation techniques. Pt reports increased daily intake of water, daily practice on abdominal breathing exercises, use of vocal rest, frequent deep breathing during conversations for a break. She indicates others have commented on the improvement in her vocal quality. Subjectively, pt voice quality was noted to be more clear than hoarse today. s/z ratio 0.81. Pt filled out Voice Handicap Index, with total score of 23..initial score was 71 on 04/26/16, indicating good progress. Pt is very pleased with her progress in therapy, and says she has learned a lot, despite being apprehensive/doubtful at first.     Assessment / Recommendations / Plan   Plan Continue with current plan of care     Progression Toward Goals   Progression toward goals Progressing toward goals          SLP Education - 05/20/16 1515    Education provided Yes   Education Details voice conservation techniques, abdominal breathing   Person(s) Educated Patient  Elta Guadeloupe   Methods Explanation;Demonstration;Verbal cues   Comprehension Verbalized understanding;Returned demonstration;Verbal cues required          SLP Short Term Goals - 05/20/16 1517      SLP SHORT TERM GOAL #1   Title Pt will verbalize 3-5 vocal hygiene strategies independently   Status Achieved     SLP SHORT TERM GOAL #2   Title Pt will report use of 3-5 vocal hygiene strategies between therapies for 3 sessions   Baseline 05/13/16, 05/20/16   Time 2   Period Weeks   Status On-going          SLP Long Term Goals - 05/20/16 1517      SLP LONG TERM GOAL #1   Title Pt will verbalize 7+ voice conservation/hygiene strategies independently   Time 6   Period Weeks   Status  On-going     SLP LONG TERM GOAL #2   Title Pt will report use of 7+ voice conservation/hygiene strategies between sessions for 3 sessions   Time 6   Period Weeks   Status On-going     SLP LONG TERM GOAL #3   Title Pt will show improvement in s:z ratio and length/quality of MPT   Time 6   Period Weeks   Status On-going     SLP LONG TERM GOAL #4   Title Pt will show improvement in Voice Handicap Index   Baseline 71 (04/26/16)   Time 6   Period Weeks   Status On-going          Plan - 05/20/16 1516    Clinical Impression Statement Pt demonstrates improvement in voice quality, and reports increase adherence to voice conservation techniques Continue skilled ST to maximize carryover of voice conservation/hygiene and WNL phonation.    Speech Therapy Frequency 2x / week   Duration --  8 weeks   Treatment/Interventions Internal/external aids;Patient/family education;Multimodal communcation approach;Compensatory strategies;Functional tasks;Compensatory techniques;SLP instruction and feedback;Environmental controls  Potential to Achieve Goals Good   Potential Considerations Ability to learn/carryover information;Family/community support;Cooperation/participation level   SLP Home Exercise Plan reviewed   Consulted and Agree with Plan of Care Patient      Patient will benefit from skilled therapeutic intervention in order to improve the following deficits and impairments:   Other voice and resonance disorders (CODE)    Problem List Patient Active Problem List   Diagnosis Date Noted  . Elevated blood pressure reading 02/19/2016  . Anemia 02/19/2016  . Acquired absence of breast and nipple 01/24/2016  . At high risk for breast cancer 01/05/2016  . 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 Endometriosis 02/26/2015  . Cough 11/03/2014  . Genetic testing 09/23/2014  . Obesity 06/19/2014  . Hoarseness 10/03/2013  . Left knee pain 07/03/2013  . Breast pain in female  01/22/2013  . Breast lump on right side at 8 o'clock position 04/24/2012  . TIA (transient ischemic attack) 12/17/2010  . Impaired glucose tolerance 12/17/2010  . Preventative health care 06/02/2010  . GERD 08/31/2009  . OBSTRUCTIVE SLEEP APNEA 08/19/2009  . BREAST HYPERTROPHY 10/30/2008  . BACK PAIN 10/30/2008  . ANXIETY 07/03/2007  . Depression 07/03/2007  . ALLERGIC RHINITIS 07/03/2007   Rayshon Albaugh B. [place], MSP, CCC-SLP  Shonna Chock 05/20/2016, 3:19 PM  [facility] [address] [place], [place], [address] Phone: [phone]   Fax:  [phone]   Name: Alejandra Harmon MRN: [id number] Date of Birth: [birth year]

## 2016-05-24 ENCOUNTER — Ambulatory Visit: Payer: BLUE CROSS/BLUE SHIELD | Admitting: Speech Pathology

## 2016-05-24 DIAGNOSIS — R498 Other voice and resonance disorders: Secondary | ICD-10-CM

## 2016-05-24 NOTE — Therapy (Signed)
[facility] [address] [place], [place], [address] Phone: [phone]   Fax:  [phone]  Speech Language Pathology Treatment  Patient Details  Name: Alejandra Harmon MRN: [id number] Date of Birth: 12-Dec-1960 Referring Provider: Dr. Lucia Gaskins  Encounter Date: 05/24/2016      End of Session - 05/24/16 0931    Visit Number 7   Number of Visits 18   Date for SLP Re-Evaluation 06/24/16   SLP Start Time 0850   SLP Stop Time  0932   SLP Time Calculation (min) 42 min      Past Medical History:  Diagnosis Date  . Allergic rhinitis   . Cancer ([place]) 12/2015   breast cancer  . Cyst of ovary   . History of esophageal reflux   . Mild sleep apnea    STUDY 07-27-2009-- POSITIONAL / REM AFFECT---  RECOMMENDATION LOOSE WT.  Marland Kitchen Pelvic pain in female   . Right thyroid nodule   . Sciatica of left side   . Wears contact lenses     Past Surgical History:  Procedure Laterality Date  . ABDOMINAL HYSTERECTOMY    . BREAST ENHANCEMENT SURGERY Bilateral 12-16-2008  . BREAST RECONSTRUCTION WITH PLACEMENT OF TISSUE EXPANDER AND FLEX HD (ACELLULAR HYDRATED DERMIS) Bilateral 01/05/2016  . BREAST RECONSTRUCTION WITH PLACEMENT OF TISSUE EXPANDER AND FLEX HD (ACELLULAR HYDRATED DERMIS) Bilateral 01/05/2016   Procedure: BILATERAL BREAST RECONSTRUCTION WITH PLACEMENT OF TISSUE EXPANDERS AND CORTIVA TISSUE;  Surgeon: [name], MD;  Location: [facility];  Service: Plastics;  Laterality: Bilateral;  . CESAREAN SECTION  1994  . COLONOSCOPY  last one 12-23-2013  . ELBOW ULNAR NERVE DECOMPRESSION Right 05-13-2003  . EXCISION BENIGN BREAST CYST  1980  . LAPAROSCOPIC ASSISTED VAGINAL HYSTERECTOMY N/A 02/26/2015   Procedure: LAPAROSCOPIC ASSISTED VAGINAL HYSTERECTOMY ;  Surgeon: Everlene Farrier, MD;  Location: [facility];  Service: Gynecology;  Laterality: N/A;  . LAPAROSCOPIC BILATERAL SALPINGO OOPHERECTOMY Bilateral 02/26/2015    Procedure: LAPAROSCOPIC BILATERAL SALPINGO OOPHORECTOMY;  Surgeon: Everlene Farrier, MD;  Location: [facility];  Service: Gynecology;  Laterality: Bilateral;  . LAPAROSCOPIC CHOLECYSTECTOMY  1998  . LAPAROSCOPY/  LYSIS ADHESIONS/  RIGHT OVARY CYSTECTOMY/  ALBATION ENDOMETRIOSIS/  CHROMOPERTUBATION  02-18-2004  . LIPOSUCTION WITH LIPOFILLING Bilateral 03/18/2016   Procedure: LIPOFILLING from abdomen to bilateral chest;  Surgeon: [name], MD;  Location: [facility];  Service: Plastics;  Laterality: Bilateral;  . MASTECTOMY Bilateral 01/05/2016   PROPHYLATIC NIPPLE SPARING MASTECTOMY   . NIPPLE SPARING MASTECTOMY Bilateral 01/05/2016   Procedure: BILATERAL PROPHYLATIC NIPPLE SPARING MASTECTOMIES;  Surgeon: Rolm Bookbinder, MD;  Location: [facility];  Service: General;  Laterality: Bilateral;  . REMOVAL OF BILATERAL TISSUE EXPANDERS WITH PLACEMENT OF BILATERAL BREAST IMPLANTS Bilateral 03/18/2016   Procedure: REMOVAL OF BILATERAL TISSUE EXPANDERS WITH PLACEMENT OF BILATERAL BREAST IMPLANTS;  Surgeon: [name], MD;  Location: [facility];  Service: Plastics;  Laterality: Bilateral;  . TISSUE EXPANDER PLACEMENT Right 01/24/2016   Procedure: REMOVAL AND PLACEMENT OF TISSUE EXPANDER/RIGHT;  Surgeon: [name], MD;  Location: [facility];  Service: Plastics;  Laterality: Right;  . TISSUE EXPANDER REMOVAL Right 01/24/2016  . TRANSTHORACIC ECHOCARDIOGRAM  07-07-2009   grade 1 diastolic dysfunction, ef 15-83%/  mild AR and TR/  trivial MR    There were no vitals filed for this visit.      Subjective Assessment - 05/24/16 0855    Subjective "It's alot better" re: voice   Currently in  Pain? No/denies               ADULT SLP TREATMENT - 05/24/16 0855      General Information   Behavior/Cognition Alert;Cooperative;Pleasant mood     Treatment Provided   Treatment provided Cognitive-Linquistic     Pain Assessment   Pain Assessment  No/denies pain     Cognitive-Linquistic Treatment   Treatment focused on Voice;Patient/family/caregiver education   Skilled Treatment Pt reports much improvement in her voice - she remains mildly hoarse. Pt forgot to take her reflux medication today - reinterated need t continue reflux meds as MD noted reflux changes in her larynx. Pt with clear phonation more than half of session. Pt utlized alternative to throat clears  I, without reminder.  Pt utilized abodominal breathing with rare min A and confidential voice with mod I. Pt to focus on vocal rest this week.      Assessment / Recommendations / Plan   Plan Continue with current plan of care     Progression Toward Goals   Progression toward goals Progressing toward goals          SLP Education - 05/24/16 0925    Education provided Yes   Education Details increase vocal rest   Person(s) Educated Patient   Methods Explanation;Demonstration;Verbal cues   Comprehension Verbalized understanding;Returned demonstration;Verbal cues required          SLP Short Term Goals - 05/24/16 0929      SLP SHORT TERM GOAL #1   Title Pt will verbalize 3-5 vocal hygiene strategies independently   Status Achieved     SLP SHORT TERM GOAL #2   Title Pt will report use of 3-5 vocal hygiene strategies between therapies for 3 sessions   Baseline 05/13/16, 05/20/16, 05/24/16   Time 2   Period Weeks   Status Achieved          SLP Long Term Goals - 05/24/16 0930      SLP LONG TERM GOAL #1   Title Pt will verbalize 7+ voice conservation/hygiene strategies independently   Time 5   Period Weeks   Status On-going     SLP LONG TERM GOAL #2   Title Pt will report use of 7+ voice conservation/hygiene strategies between sessions for 3 sessions   Time 5   Period Weeks   Status On-going     SLP LONG TERM GOAL #3   Title Pt will show improvement in s:z ratio and length/quality of MPT   Time 6   Period Weeks   Status On-going     SLP LONG TERM  GOAL #4   Title Pt will show improvement in Voice Handicap Index   Baseline 71 (04/26/16)   Time 6   Period Weeks   Status On-going          Plan - 05/24/16 0929    Speech Therapy Frequency 2x / week   Treatment/Interventions Internal/external aids;Patient/family education;Multimodal communcation approach;Compensatory strategies;Functional tasks;Compensatory techniques;SLP instruction and feedback;Environmental controls   Potential to Achieve Goals Good   Potential Considerations Ability to learn/carryover information;Family/community support;Cooperation/participation level   SLP Home Exercise Plan reviewed   Consulted and Agree with Plan of Care Patient      Patient will benefit from skilled therapeutic intervention in order to improve the following deficits and impairments:   Other voice and resonance disorders (CODE)    Problem List Patient Active Problem List   Diagnosis Date Noted  . Elevated blood pressure reading 02/19/2016  . Anemia  02/19/2016  . Acquired absence of breast and nipple 01/24/2016  . At high risk for breast cancer 01/05/2016  . 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 Endometriosis 02/26/2015  . Cough 11/03/2014  . Genetic testing 09/23/2014  . Obesity 06/19/2014  . Hoarseness 10/03/2013  . Left knee pain 07/03/2013  . Breast pain in female 01/22/2013  . Breast lump on right side at 8 o'clock position 04/24/2012  . TIA (transient ischemic attack) 12/17/2010  . Impaired glucose tolerance 12/17/2010  . Preventative health care 06/02/2010  . GERD 08/31/2009  . OBSTRUCTIVE SLEEP APNEA 08/19/2009  . BREAST HYPERTROPHY 10/30/2008  . BACK PAIN 10/30/2008  . ANXIETY 07/03/2007  . Depression 07/03/2007  . ALLERGIC RHINITIS 07/03/2007    Lovvorn, Laura Ann MS, CCC-SLP 05/24/2016, 9:33 AM  [facility] [address] [place], [place], [address] Phone: [phone]   Fax:   [phone]   Name: Porcha T Buchberger MRN: [id number] Date of Birth: 12/22/1960 

## 2016-05-24 NOTE — Patient Instructions (Signed)
  Continue: No throat clears, confidential voice, reflux meds, belly breathing, no talking over noise - restaurants, bars, sports events,   This week: Increase vocal rest - periods of no talking - morning and evening  - every other day You can talk at lunch time and at work  Decrease to 1x a week

## 2016-05-26 ENCOUNTER — Encounter: Payer: BLUE CROSS/BLUE SHIELD | Admitting: Speech Pathology

## 2016-05-30 ENCOUNTER — Encounter: Payer: BLUE CROSS/BLUE SHIELD | Admitting: Speech Pathology

## 2016-05-31 ENCOUNTER — Ambulatory Visit: Payer: BLUE CROSS/BLUE SHIELD | Admitting: Speech Pathology

## 2016-05-31 DIAGNOSIS — R498 Other voice and resonance disorders: Secondary | ICD-10-CM | POA: Diagnosis not present

## 2016-05-31 NOTE — Patient Instructions (Addendum)
  Continue logging vocal rest  Continue daily written assessment of voice and daily use of strategies making note of your voice quality each day. If you are extra hoarse you know what you have to do  Consider taking reflux meds before dinner  Re - read your goal sheets and Voice Conservation, goal sheets and  Reflux sheet weekly   Use microphone for public speaking!!

## 2016-05-31 NOTE — Therapy (Signed)
[facility] [address] [place], [place], [address] Phone: [phone]   Fax:  [phone]  Speech Language Pathology Treatment  Patient Details  Name: Alejandra Harmon MRN: [id number] Date of Birth: 21-Mar-1960 Referring Provider: Dr. Lucia Gaskins  Encounter Date: 05/31/2016      End of Session - 05/31/16 0936    SLP Start Time 0900   SLP Stop Time  0931   SLP Time Calculation (min) 31 min      Past Medical History:  Diagnosis Date  . Allergic rhinitis   . Cancer ([place]) 12/2015   breast cancer  . Cyst of ovary   . History of esophageal reflux   . Mild sleep apnea    STUDY 07-27-2009-- POSITIONAL / REM AFFECT---  RECOMMENDATION LOOSE WT.  Marland Kitchen Pelvic pain in female   . Right thyroid nodule   . Sciatica of left side   . Wears contact lenses     Past Surgical History:  Procedure Laterality Date  . ABDOMINAL HYSTERECTOMY    . BREAST ENHANCEMENT SURGERY Bilateral 12-16-2008  . BREAST RECONSTRUCTION WITH PLACEMENT OF TISSUE EXPANDER AND FLEX HD (ACELLULAR HYDRATED DERMIS) Bilateral 01/05/2016  . BREAST RECONSTRUCTION WITH PLACEMENT OF TISSUE EXPANDER AND FLEX HD (ACELLULAR HYDRATED DERMIS) Bilateral 01/05/2016   Procedure: BILATERAL BREAST RECONSTRUCTION WITH PLACEMENT OF TISSUE EXPANDERS AND CORTIVA TISSUE;  Surgeon: [name], MD;  Location: [facility];  Service: Plastics;  Laterality: Bilateral;  . CESAREAN SECTION  1994  . COLONOSCOPY  last one 12-23-2013  . ELBOW ULNAR NERVE DECOMPRESSION Right 05-13-2003  . EXCISION BENIGN BREAST CYST  1980  . LAPAROSCOPIC ASSISTED VAGINAL HYSTERECTOMY N/A 02/26/2015   Procedure: LAPAROSCOPIC ASSISTED VAGINAL HYSTERECTOMY ;  Surgeon: Everlene Farrier, MD;  Location: [facility];  Service: Gynecology;  Laterality: N/A;  . LAPAROSCOPIC BILATERAL SALPINGO OOPHERECTOMY Bilateral 02/26/2015   Procedure: LAPAROSCOPIC BILATERAL SALPINGO OOPHORECTOMY;  Surgeon: Everlene Farrier,  MD;  Location: [facility];  Service: Gynecology;  Laterality: Bilateral;  . LAPAROSCOPIC CHOLECYSTECTOMY  1998  . LAPAROSCOPY/  LYSIS ADHESIONS/  RIGHT OVARY CYSTECTOMY/  ALBATION ENDOMETRIOSIS/  CHROMOPERTUBATION  02-18-2004  . LIPOSUCTION WITH LIPOFILLING Bilateral 03/18/2016   Procedure: LIPOFILLING from abdomen to bilateral chest;  Surgeon: [name], MD;  Location: [facility];  Service: Plastics;  Laterality: Bilateral;  . MASTECTOMY Bilateral 01/05/2016   PROPHYLATIC NIPPLE SPARING MASTECTOMY   . NIPPLE SPARING MASTECTOMY Bilateral 01/05/2016   Procedure: BILATERAL PROPHYLATIC NIPPLE SPARING MASTECTOMIES;  Surgeon: Rolm Bookbinder, MD;  Location: [facility];  Service: General;  Laterality: Bilateral;  . REMOVAL OF BILATERAL TISSUE EXPANDERS WITH PLACEMENT OF BILATERAL BREAST IMPLANTS Bilateral 03/18/2016   Procedure: REMOVAL OF BILATERAL TISSUE EXPANDERS WITH PLACEMENT OF BILATERAL BREAST IMPLANTS;  Surgeon: [name], MD;  Location: [facility];  Service: Plastics;  Laterality: Bilateral;  . TISSUE EXPANDER PLACEMENT Right 01/24/2016   Procedure: REMOVAL AND PLACEMENT OF TISSUE EXPANDER/RIGHT;  Surgeon: [name], MD;  Location: [facility];  Service: Plastics;  Laterality: Right;  . TISSUE EXPANDER REMOVAL Right 01/24/2016  . TRANSTHORACIC ECHOCARDIOGRAM  07-07-2009   grade 1 diastolic dysfunction, ef 35-57%/  mild AR and TR/  trivial MR    There were no vitals filed for this visit.      Subjective Assessment - 05/31/16 0936    Subjective "It's much better" re voice; pt 15 minutes late for sessions               ADULT SLP  TREATMENT - 05/31/16 0905      General Information   Behavior/Cognition Alert;Cooperative;Pleasant mood     Treatment Provided   Treatment provided Cognitive-Linquistic     Cognitive-Linquistic Treatment   Treatment focused on Voice;Patient/family/caregiver education   Skilled Treatment Pt  kept vocal rest log, subjectively rates her voice 90-95% normal. She has used stickey note on computer to remind her for belly breathing. She continues to take reflux meds, reviewed rationale for reflux meds. Sustained /a/ phonation average 15.21 seconds - WNL; s/z ratio average .977 also WFL. Pt verbalized 7 strategies for vocal hygiene with mod I.      Assessment / Recommendations / Plan   Plan Discharge SLP treatment due to (comment);All goals met     Progression Toward Goals   Progression toward goals Goals met, education completed, patient discharged from [facility] Education - 05/31/16 0932    Education provided Yes   Education Details use microphone for public speaking, continue all vocal conservation strategies, continue logging vocal rest and strategies   Person(s) Educated Patient   Methods Explanation;Demonstration;Handout   Comprehension Verbalized understanding;Returned demonstration     SPEECH THERAPY DISCHARGE SUMMARY  Visits from Start of Care: 8  Current functional level related to goals / functional outcomes: See goals below   Remaining deficits: Mild intermittent hoarse voice   Education / Equipment: Vocal hygiene, voice conservation; LPR,  Plan: Patient agrees to discharge.  Patient goals were met. Patient is being discharged due to meeting the stated rehab goals.  ?????          SLP Short Term Goals - 05/31/16 0935      SLP SHORT TERM GOAL #1   Title Pt will verbalize 3-5 vocal hygiene strategies independently   Status Achieved     SLP SHORT TERM GOAL #2   Title Pt will report use of 3-5 vocal hygiene strategies between therapies for 3 sessions   Baseline 05/13/16, 05/20/16, 05/24/16   Time 2   Period Weeks   Status Achieved          SLP Long Term Goals - 05/31/16 0935      SLP LONG TERM GOAL #1   Title Pt will verbalize 7+ voice conservation/hygiene strategies independently   Time 5   Period Weeks   Status Achieved     SLP LONG  TERM GOAL #2   Title Pt will report use of 7+ voice conservation/hygiene strategies between sessions for 3 sessions   Time 5   Period Weeks   Status Achieved     SLP LONG TERM GOAL #3   Title Pt will show improvement in s:z ratio and length/quality of MPT   Time 6   Period Weeks   Status Achieved     SLP LONG TERM GOAL #4   Title Pt will show improvement in Voice Handicap Index   Baseline 71 (04/26/16)   Time 6   Period Weeks   Status Deferred          Plan - 05/31/16 1214    Clinical Impression Statement Pt subjectively reports voice quality at 90% of normal voice. Sustained phonation and s:z ratio are WNL. Pt consistentlyy following vocal hygiene strategies outside of therapy with mod I. D/c ST at this time, all goals met.   Speech Therapy Frequency 2x / week   Treatment/Interventions Internal/external aids;Patient/family education;Multimodal communcation approach;Compensatory strategies;Functional tasks;Compensatory techniques;SLP instruction and feedback;Environmental controls   Potential to Achieve Goals  Good   Potential Considerations Ability to learn/carryover information;Family/community support;Cooperation/participation level   SLP Home Exercise Plan reviewed   Consulted and Agree with Plan of Care Patient      Patient will benefit from skilled therapeutic intervention in order to improve the following deficits and impairments:   Other voice and resonance disorders (CODE)    Problem List Patient Active Problem List   Diagnosis Date Noted  . Elevated blood pressure reading 02/19/2016  . Anemia 02/19/2016  . Acquired absence of breast and nipple 01/24/2016  . At high risk for breast cancer 01/05/2016  . 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 Endometriosis 02/26/2015  . Cough 11/03/2014  . Genetic testing 09/23/2014  . Obesity 06/19/2014  . Hoarseness 10/03/2013  . Left knee pain 07/03/2013  . Breast pain in female 01/22/2013  . Breast lump on  right side at 8 o'clock position 04/24/2012  . TIA (transient ischemic attack) 12/17/2010  . Impaired glucose tolerance 12/17/2010  . Preventative health care 06/02/2010  . GERD 08/31/2009  . OBSTRUCTIVE SLEEP APNEA 08/19/2009  . BREAST HYPERTROPHY 10/30/2008  . BACK PAIN 10/30/2008  . ANXIETY 07/03/2007  . Depression 07/03/2007  . ALLERGIC RHINITIS 07/03/2007    Lashelle Koy, Annye Rusk MS, CCC-SLP 05/31/2016, 12:15 PM  [facility] [address] [place], [place], [address] Phone: [phone]   Fax:  [phone]   Name: Alejandra Harmon MRN: [id number] Date of Birth: [birth year]

## 2016-06-06 ENCOUNTER — Encounter: Payer: BLUE CROSS/BLUE SHIELD | Admitting: Speech Pathology

## 2016-06-09 ENCOUNTER — Encounter: Payer: BLUE CROSS/BLUE SHIELD | Admitting: Speech Pathology

## 2016-07-27 NOTE — H&P (Signed)
Subjective:     Patient ID: Alejandra Harmon is a 56 y.o. female.  Follow-up   4.5 months post op. Plan for revision reconstruction June 2018.  Interval history includes improved BP without medication but elevated BS and HbA1c 6.5 per patient- counseled if not improved with diet may start oral medication.  Known PALB2 mutation with several family members with breast ca. Patient with screening MMG 06/2015 normal. Had diagnostic MMG 08/2015 for palpable mass and fat necrosis on right and cyst on left noted. MRI demonstrated 8 x 7 x 7 mm enhancing mass at 3 o clock. She underwent subsequent second look [place] with 20m suspicious mass right and borderline prominent right axillary LN with a cortex measuring 4 mm. [place] biopsy with fat necrosis. She underwent repeat MRI to ensure mass biopsied and clip noted within mass. Final pathology benign bilateral.  History of breast reduction 2010 with Alejandra Harmon Prior to mastectomies B cup, down from D. Left mastectomy 843 g Right 835 g.  Review of Systems    Objective:   Physical Exam  Cardiovascular: Normal rate, regular rhythm and normal heart sounds.   Pulmonary/Chest: Effort normal and breath sounds normal.  Abdominal: Soft.    Chest: soft no contracture, right superior pole with depression, in supine position  appr 5 cm lateral displacement Animation present bilateral greater on right, linear depression right NAC Bilateral lateral chest soft tissue rolls  SN to nipple R 23 L 25 cm Nipple to IMF R 9 L 9 cm (prior reduction scar at IMF cephalic to IMF)      Assessment:     PALB2 mutation, History breast reduction S/p bilateral NSM, TE/ADM (Cortiva) reconstruction S/p removal/replacement right TE, removal ADM  S/p silicone implant exchange, lipofilling    Plan:   Plan liposuction to lateral chest rolls.  She is happy with left breast superior pole fullness and contour. Over right plan fat grafting and revision of pocket with  replacement ADM to narrow and support implant pocket. Will continue with same implant size.  Discussed OP Harmon, drain on right, donor site for fat grafting flanks.   Reviewed risks including but not limited to infection, fat necrosis presenting as lumps, variable take fat, donor site pain and contour abnormalities, seroma, hematoma, asymmetry, DVT/PE, damage to deeper structures, need for additional Harmon, unacceptable cosmetic result.    Rx for Cipro and phengran given for post op use. Has oxycodone at home. Desires scop patch.  50 ml fat infiltrated each side over right and left chest. Natrelle Inspira Smooth Round Extra Projection 700 ml implants placed bilateral, REF SRX-700     Alejandra Harmon Alejandra Harmon [phone] pin [phone]

## 2016-08-04 ENCOUNTER — Encounter (HOSPITAL_BASED_OUTPATIENT_CLINIC_OR_DEPARTMENT_OTHER): Payer: Self-pay | Admitting: *Deleted

## 2016-08-08 ENCOUNTER — Encounter (HOSPITAL_BASED_OUTPATIENT_CLINIC_OR_DEPARTMENT_OTHER): Payer: Self-pay | Admitting: *Deleted

## 2016-08-08 NOTE — Pre-Procedure Instructions (Signed)
Gave pt 8oz water provided by [facility]. Educated pt to drink the morning of surgery 08/12/16 by [id number].

## 2016-08-12 ENCOUNTER — Ambulatory Visit (HOSPITAL_BASED_OUTPATIENT_CLINIC_OR_DEPARTMENT_OTHER)
Admission: RE | Admit: 2016-08-12 | Discharge: 2016-08-13 | Disposition: A | Discharge status: Home / Self Care | Payer: BLUE CROSS/BLUE SHIELD | Source: Ambulatory Visit | Attending: Plastic Surgery | Admitting: Plastic Surgery

## 2016-08-12 ENCOUNTER — Encounter (HOSPITAL_BASED_OUTPATIENT_CLINIC_OR_DEPARTMENT_OTHER): Payer: Self-pay | Admitting: *Deleted

## 2016-08-12 ENCOUNTER — Ambulatory Visit (HOSPITAL_BASED_OUTPATIENT_CLINIC_OR_DEPARTMENT_OTHER): Payer: BLUE CROSS/BLUE SHIELD | Admitting: Anesthesiology

## 2016-08-12 ENCOUNTER — Encounter (HOSPITAL_BASED_OUTPATIENT_CLINIC_OR_DEPARTMENT_OTHER)
Admission: RE | Disposition: A | Discharge status: Home / Self Care | Payer: Self-pay | Source: Ambulatory Visit | Attending: Plastic Surgery

## 2016-08-12 DIAGNOSIS — E669 Obesity, unspecified: Secondary | ICD-10-CM | POA: Diagnosis not present

## 2016-08-12 DIAGNOSIS — K219 Gastro-esophageal reflux disease without esophagitis: Secondary | ICD-10-CM | POA: Diagnosis not present

## 2016-08-12 DIAGNOSIS — Z79899 Other long term (current) drug therapy: Secondary | ICD-10-CM | POA: Diagnosis not present

## 2016-08-12 DIAGNOSIS — T8542XA Displacement of breast prosthesis and implant, initial encounter: Secondary | ICD-10-CM | POA: Diagnosis present

## 2016-08-12 DIAGNOSIS — Z9010 Acquired absence of unspecified breast and nipple: Secondary | ICD-10-CM

## 2016-08-12 DIAGNOSIS — F418 Other specified anxiety disorders: Secondary | ICD-10-CM | POA: Diagnosis not present

## 2016-08-12 DIAGNOSIS — Z6837 Body mass index (BMI) 37.0-37.9, adult: Secondary | ICD-10-CM | POA: Insufficient documentation

## 2016-08-12 DIAGNOSIS — I10 Essential (primary) hypertension: Secondary | ICD-10-CM | POA: Insufficient documentation

## 2016-08-12 HISTORY — PX: BREAST RECONSTRUCTION WITH PLACEMENT OF TISSUE EXPANDER AND FLEX HD (ACELLULAR HYDRATED DERMIS): SHX6295

## 2016-08-12 HISTORY — DX: Prediabetes: R73.03

## 2016-08-12 HISTORY — DX: Gastro-esophageal reflux disease without esophagitis: K21.9

## 2016-08-12 HISTORY — PX: LIPOSUCTION WITH LIPOFILLING: SHX6436

## 2016-08-12 LAB — POCT HEMOGLOBIN-HEMACUE: HEMOGLOBIN: 12.6 g/dL (ref 12.0–15.0)

## 2016-08-12 SURGERY — LIPOSUCTION, WITH FAT TRANSFER
Anesthesia: General | Site: Chest | Laterality: Right

## 2016-08-12 MED ORDER — FENTANYL CITRATE (PF) 100 MCG/2ML IJ SOLN
INTRAMUSCULAR | Status: AC
  Filled 2016-08-12: qty 2

## 2016-08-12 MED ORDER — ONDANSETRON HCL 4 MG/2ML IJ SOLN
INTRAMUSCULAR | Status: DC | PRN
  Administered 2016-08-12: 4 mg via INTRAVENOUS

## 2016-08-12 MED ORDER — ROCURONIUM BROMIDE 10 MG/ML (PF) SYRINGE
PREFILLED_SYRINGE | INTRAVENOUS | Status: AC
  Filled 2016-08-12: qty 5

## 2016-08-12 MED ORDER — SODIUM CHLORIDE 0.9 % IV SOLN
INTRAVENOUS | Status: DC | PRN
  Administered 2016-08-12: 1000 mL

## 2016-08-12 MED ORDER — KETOROLAC TROMETHAMINE 30 MG/ML IJ SOLN
INTRAMUSCULAR | Status: AC
  Filled 2016-08-12: qty 1

## 2016-08-12 MED ORDER — GLYCOPYRROLATE 0.2 MG/ML IJ SOLN
0.2000 mg | Freq: Once | INTRAMUSCULAR | Status: AC
  Administered 2016-08-12: 0.2 mg via INTRAVENOUS

## 2016-08-12 MED ORDER — SCOPOLAMINE 1 MG/3DAYS TD PT72
MEDICATED_PATCH | TRANSDERMAL | Status: AC
  Filled 2016-08-12: qty 1

## 2016-08-12 MED ORDER — LIDOCAINE HCL 1 % IJ SOLN
INTRAMUSCULAR | Status: AC
  Filled 2016-08-12: qty 60

## 2016-08-12 MED ORDER — HYDROMORPHONE HCL 1 MG/ML IJ SOLN: 0.5000 mg | INTRAMUSCULAR | Status: DC | PRN

## 2016-08-12 MED ORDER — ONDANSETRON HCL 4 MG/2ML IJ SOLN: 4.0000 mg | Freq: Four times a day (QID) | INTRAMUSCULAR | Status: DC | PRN

## 2016-08-12 MED ORDER — KETOROLAC TROMETHAMINE 30 MG/ML IJ SOLN
30.0000 mg | Freq: Three times a day (TID) | INTRAMUSCULAR | Status: AC
  Administered 2016-08-12 – 2016-08-13 (×3): 30 mg via INTRAVENOUS
  Filled 2016-08-12 (×2): qty 1

## 2016-08-12 MED ORDER — ACETAMINOPHEN 500 MG PO TABS
1000.0000 mg | ORAL_TABLET | ORAL | Status: AC
  Administered 2016-08-12: 1000 mg via ORAL

## 2016-08-12 MED ORDER — SODIUM BICARBONATE 4 % IV SOLN
INTRAVENOUS | Status: AC
  Filled 2016-08-12: qty 5

## 2016-08-12 MED ORDER — LACTATED RINGERS IV SOLN
INTRAVENOUS | Status: DC
  Administered 2016-08-12 (×2): via INTRAVENOUS

## 2016-08-12 MED ORDER — CHLORHEXIDINE GLUCONATE CLOTH 2 % EX PADS: 6.0000 | MEDICATED_PAD | Freq: Once | CUTANEOUS | Status: DC

## 2016-08-12 MED ORDER — LIDOCAINE 2% (20 MG/ML) 5 ML SYRINGE
INTRAMUSCULAR | Status: AC
  Filled 2016-08-12: qty 5

## 2016-08-12 MED ORDER — LACTATED RINGERS IV SOLN: INTRAVENOUS | Status: DC

## 2016-08-12 MED ORDER — BUPIVACAINE-EPINEPHRINE (PF) 0.25% -1:200000 IJ SOLN
INTRAMUSCULAR | Status: AC
  Filled 2016-08-12: qty 30

## 2016-08-12 MED ORDER — FENTANYL CITRATE (PF) 100 MCG/2ML IJ SOLN
50.0000 ug | INTRAMUSCULAR | Status: AC | PRN
  Administered 2016-08-12: 25 ug via INTRAVENOUS
  Administered 2016-08-12: 50 ug via INTRAVENOUS
  Administered 2016-08-12: 100 ug via INTRAVENOUS
  Administered 2016-08-12: 25 ug via INTRAVENOUS

## 2016-08-12 MED ORDER — MIDAZOLAM HCL 2 MG/2ML IJ SOLN
1.0000 mg | INTRAMUSCULAR | Status: DC | PRN
  Administered 2016-08-12: 2 mg via INTRAVENOUS

## 2016-08-12 MED ORDER — EPINEPHRINE 30 MG/30ML IJ SOLN
INTRAMUSCULAR | Status: AC
  Filled 2016-08-12: qty 1

## 2016-08-12 MED ORDER — GLYCOPYRROLATE 0.2 MG/ML IJ SOLN
INTRAMUSCULAR | Status: AC
  Filled 2016-08-12: qty 1

## 2016-08-12 MED ORDER — CEFAZOLIN SODIUM-DEXTROSE 1-4 GM/50ML-% IV SOLN
1.0000 g | Freq: Three times a day (TID) | INTRAVENOUS | Status: AC
  Administered 2016-08-12 – 2016-08-13 (×3): 1 g via INTRAVENOUS
  Filled 2016-08-12 (×3): qty 50

## 2016-08-12 MED ORDER — LIDOCAINE 2% (20 MG/ML) 5 ML SYRINGE
INTRAMUSCULAR | Status: DC | PRN
  Administered 2016-08-12: 80 mg via INTRAVENOUS

## 2016-08-12 MED ORDER — ONDANSETRON HCL 4 MG/2ML IJ SOLN
INTRAMUSCULAR | Status: AC
  Filled 2016-08-12: qty 2

## 2016-08-12 MED ORDER — KETOROLAC TROMETHAMINE 30 MG/ML IJ SOLN
INTRAMUSCULAR | Status: DC | PRN
  Administered 2016-08-12: 30 mg via INTRAVENOUS

## 2016-08-12 MED ORDER — GABAPENTIN 300 MG PO CAPS
ORAL_CAPSULE | ORAL | Status: AC
  Filled 2016-08-12: qty 1

## 2016-08-12 MED ORDER — SUGAMMADEX SODIUM 200 MG/2ML IV SOLN
INTRAVENOUS | Status: DC | PRN
  Administered 2016-08-12: 200 mg via INTRAVENOUS

## 2016-08-12 MED ORDER — KCL IN DEXTROSE-NACL 20-5-0.45 MEQ/L-%-% IV SOLN
INTRAVENOUS | Status: DC
  Administered 2016-08-12: 15:00:00 via INTRAVENOUS
  Filled 2016-08-12: qty 1000

## 2016-08-12 MED ORDER — MEPERIDINE HCL 25 MG/ML IJ SOLN: 6.2500 mg | INTRAMUSCULAR | Status: DC | PRN

## 2016-08-12 MED ORDER — ACETAMINOPHEN 500 MG PO TABS
ORAL_TABLET | ORAL | Status: AC
  Filled 2016-08-12: qty 2

## 2016-08-12 MED ORDER — CEFAZOLIN SODIUM-DEXTROSE 2-4 GM/100ML-% IV SOLN
2.0000 g | INTRAVENOUS | Status: AC
  Administered 2016-08-12: 2 g via INTRAVENOUS

## 2016-08-12 MED ORDER — DIPHENHYDRAMINE HCL 50 MG/ML IJ SOLN: 25.0000 mg | Freq: Four times a day (QID) | INTRAMUSCULAR | Status: DC | PRN

## 2016-08-12 MED ORDER — SUGAMMADEX SODIUM 200 MG/2ML IV SOLN
INTRAVENOUS | Status: AC
  Filled 2016-08-12: qty 2

## 2016-08-12 MED ORDER — PROPOFOL 10 MG/ML IV BOLUS
INTRAVENOUS | Status: DC | PRN
  Administered 2016-08-12: 150 mg via INTRAVENOUS

## 2016-08-12 MED ORDER — 0.9 % SODIUM CHLORIDE (POUR BTL) OPTIME
TOPICAL | Status: DC | PRN
  Administered 2016-08-12: 1000 mL

## 2016-08-12 MED ORDER — METHOCARBAMOL 500 MG PO TABS
500.0000 mg | ORAL_TABLET | Freq: Three times a day (TID) | ORAL | Status: DC | PRN
  Administered 2016-08-12 (×2): 500 mg via ORAL
  Filled 2016-08-12 (×2): qty 1

## 2016-08-12 MED ORDER — METOCLOPRAMIDE HCL 5 MG/ML IJ SOLN: 10.0000 mg | Freq: Once | INTRAMUSCULAR | Status: DC | PRN

## 2016-08-12 MED ORDER — CEFAZOLIN SODIUM-DEXTROSE 2-4 GM/100ML-% IV SOLN
INTRAVENOUS | Status: AC
  Filled 2016-08-12: qty 100

## 2016-08-12 MED ORDER — FENTANYL CITRATE (PF) 100 MCG/2ML IJ SOLN
25.0000 ug | INTRAMUSCULAR | Status: DC | PRN
  Administered 2016-08-12 (×3): 25 ug via INTRAVENOUS

## 2016-08-12 MED ORDER — OXYCODONE HCL 5 MG PO TABS
5.0000 mg | ORAL_TABLET | Freq: Once | ORAL | Status: AC | PRN
  Administered 2016-08-12: 5 mg via ORAL

## 2016-08-12 MED ORDER — DEXAMETHASONE SODIUM PHOSPHATE 10 MG/ML IJ SOLN
INTRAMUSCULAR | Status: DC | PRN
  Administered 2016-08-12: 10 mg via INTRAVENOUS

## 2016-08-12 MED ORDER — OXYCODONE HCL 5 MG PO TABS
ORAL_TABLET | ORAL | Status: AC
  Filled 2016-08-12: qty 1

## 2016-08-12 MED ORDER — DEXAMETHASONE SODIUM PHOSPHATE 10 MG/ML IJ SOLN
INTRAMUSCULAR | Status: AC
  Filled 2016-08-12: qty 1

## 2016-08-12 MED ORDER — GABAPENTIN 300 MG PO CAPS
300.0000 mg | ORAL_CAPSULE | ORAL | Status: AC
  Administered 2016-08-12: 300 mg via ORAL

## 2016-08-12 MED ORDER — MIDAZOLAM HCL 2 MG/2ML IJ SOLN
INTRAMUSCULAR | Status: AC
  Filled 2016-08-12: qty 2

## 2016-08-12 MED ORDER — ONDANSETRON 4 MG PO TBDP
4.0000 mg | ORAL_TABLET | Freq: Four times a day (QID) | ORAL | Status: DC | PRN
  Administered 2016-08-13: 4 mg via ORAL
  Filled 2016-08-12: qty 1

## 2016-08-12 MED ORDER — DIPHENHYDRAMINE HCL 25 MG PO CAPS: 25.0000 mg | ORAL_CAPSULE | Freq: Four times a day (QID) | ORAL | Status: DC | PRN

## 2016-08-12 MED ORDER — LACTATED RINGERS IV SOLN
INTRAVENOUS | Status: DC | PRN
  Administered 2016-08-12: 400 mL via INTRAMUSCULAR

## 2016-08-12 MED ORDER — ROCURONIUM BROMIDE 100 MG/10ML IV SOLN
INTRAVENOUS | Status: DC | PRN
  Administered 2016-08-12: 40 mg via INTRAVENOUS

## 2016-08-12 MED ORDER — OXYCODONE HCL 5 MG PO TABS
5.0000 mg | ORAL_TABLET | ORAL | Status: DC | PRN
  Administered 2016-08-12 – 2016-08-13 (×3): 10 mg via ORAL
  Filled 2016-08-12 (×3): qty 2

## 2016-08-12 MED ORDER — SCOPOLAMINE 1 MG/3DAYS TD PT72
1.0000 | MEDICATED_PATCH | Freq: Once | TRANSDERMAL | Status: AC | PRN
  Administered 2016-08-12: 1 via TRANSDERMAL

## 2016-08-12 SURGICAL SUPPLY — 73 items
ALLODERM 8X16 READY TO USE (Tissue) ×2 IMPLANT
ALLODERM RTU 8X16 (Tissue) ×1 IMPLANT
BAG DECANTER FOR FLEXI CONT (MISCELLANEOUS) ×3 IMPLANT
BINDER ABDOMINAL 10 UNV 27-48 (MISCELLANEOUS) ×4 IMPLANT
BINDER ABDOMINAL 12 SM 30-45 (SOFTGOODS) IMPLANT
BINDER BREAST LRG (GAUZE/BANDAGES/DRESSINGS) IMPLANT
BINDER BREAST MEDIUM (GAUZE/BANDAGES/DRESSINGS) IMPLANT
BINDER BREAST XLRG (GAUZE/BANDAGES/DRESSINGS) ×4 IMPLANT
BINDER BREAST XXLRG (GAUZE/BANDAGES/DRESSINGS) IMPLANT
BLADE SURG 10 STRL SS (BLADE) ×5 IMPLANT
BLADE SURG 11 STRL SS (BLADE) ×3 IMPLANT
BLADE SURG 15 STRL LF DISP TIS (BLADE) IMPLANT
BLADE SURG 15 STRL SS (BLADE) ×3
BNDG GAUZE ELAST 4 BULKY (GAUZE/BANDAGES/DRESSINGS) ×6 IMPLANT
CANISTER LIPO FAT HARVEST (MISCELLANEOUS) ×3 IMPLANT
CANISTER SUCT 1200ML W/VALVE (MISCELLANEOUS) ×3 IMPLANT
CHLORAPREP W/TINT 26ML (MISCELLANEOUS) ×5 IMPLANT
COVER BACK TABLE 60X90IN (DRAPES) ×3 IMPLANT
COVER MAYO STAND STRL (DRAPES) ×5 IMPLANT
DRAIN CHANNEL 15F RND FF W/TCR (WOUND CARE) ×3 IMPLANT
DRAPE TOP ARMCOVERS (MISCELLANEOUS) ×3 IMPLANT
DRAPE U-SHAPE 76X120 STRL (DRAPES) ×3 IMPLANT
DRSG PAD ABDOMINAL 8X10 ST (GAUZE/BANDAGES/DRESSINGS) ×10 IMPLANT
DRSG TEGADERM 4X10 (GAUZE/BANDAGES/DRESSINGS) IMPLANT
ELECT BLADE 4.0 EZ CLEAN MEGAD (MISCELLANEOUS) ×3
ELECT COATED BLADE 2.86 ST (ELECTRODE) ×3 IMPLANT
ELECT REM PT RETURN 9FT ADLT (ELECTROSURGICAL) ×3
ELECTRODE BLDE 4.0 EZ CLN MEGD (MISCELLANEOUS) ×1 IMPLANT
ELECTRODE REM PT RTRN 9FT ADLT (ELECTROSURGICAL) ×1 IMPLANT
EVACUATOR SILICONE 100CC (DRAIN) ×3 IMPLANT
FILTER LIPOSUCTION (MISCELLANEOUS) ×3 IMPLANT
GLOVE BIO SURGEON STRL SZ 6 (GLOVE) ×8 IMPLANT
GOWN STRL REUS W/ TWL LRG LVL3 (GOWN DISPOSABLE) ×2 IMPLANT
GOWN STRL REUS W/TWL LRG LVL3 (GOWN DISPOSABLE) ×6
IMPL BREAST INSPI SRX 700CC (Breast) IMPLANT
IMPLANT BREAST INSPI SRX 700CC (Breast) ×3 IMPLANT
LINER CANISTER 1000CC FLEX (MISCELLANEOUS) ×3 IMPLANT
LIQUID BAND (GAUZE/BANDAGES/DRESSINGS) ×8 IMPLANT
NDL SAFETY ECLIPSE 18X1.5 (NEEDLE) ×1 IMPLANT
NEEDLE HYPO 18GX1.5 SHARP (NEEDLE) ×3
NS IRRIG 1000ML POUR BTL (IV SOLUTION) ×3 IMPLANT
PACK BASIN DAY SURGERY FS (CUSTOM PROCEDURE TRAY) ×3 IMPLANT
PAD ALCOHOL SWAB (MISCELLANEOUS) ×5 IMPLANT
PENCIL BUTTON HOLSTER BLD 10FT (ELECTRODE) ×3 IMPLANT
PIN SAFETY STERILE (MISCELLANEOUS) ×3 IMPLANT
SHEET MEDIUM DRAPE 40X70 STRL (DRAPES) ×3 IMPLANT
SLEEVE SCD COMPRESS KNEE MED (MISCELLANEOUS) ×3 IMPLANT
SPONGE LAP 18X18 X RAY DECT (DISPOSABLE) ×9 IMPLANT
STAPLER VISISTAT 35W (STAPLE) ×3 IMPLANT
SUT ETHILON 2 0 FS 18 (SUTURE) ×2 IMPLANT
SUT MNCRL AB 4-0 PS2 18 (SUTURE) ×5 IMPLANT
SUT PDS AB 2-0 CT2 27 (SUTURE) ×8 IMPLANT
SUT VIC AB 0 CT1 27 (SUTURE)
SUT VIC AB 0 CT1 27XBRD ANBCTR (SUTURE) IMPLANT
SUT VIC AB 3-0 PS1 18 (SUTURE)
SUT VIC AB 3-0 PS1 18XBRD (SUTURE) IMPLANT
SUT VIC AB 3-0 SH 27 (SUTURE) ×6
SUT VIC AB 3-0 SH 27X BRD (SUTURE) ×2 IMPLANT
SUT VICRYL 4-0 PS2 18IN ABS (SUTURE) ×6 IMPLANT
SUT VLOC 180 0 24IN GS25 (SUTURE) IMPLANT
SYR 10ML LL (SYRINGE) ×11 IMPLANT
SYR 50ML LL SCALE MARK (SYRINGE) ×12 IMPLANT
SYR BULB IRRIGATION 50ML (SYRINGE) ×6 IMPLANT
SYR TB 1ML LL NO SAFETY (SYRINGE) ×2 IMPLANT
TAPE MEASURE VINYL STERILE (MISCELLANEOUS) ×1 IMPLANT
TISSUE ALLDRM RTU 8X16 (Tissue) IMPLANT
TOWEL OR 17X24 6PK STRL BLUE (TOWEL DISPOSABLE) ×6 IMPLANT
TUBE CONNECTING 20'X1/4 (TUBING) ×3
TUBE CONNECTING 20X1/4 (TUBING) ×4 IMPLANT
TUBING INFILTRATION IT-10001 (TUBING) ×3 IMPLANT
TUBING SET GRADUATE ASPIR 12FT (MISCELLANEOUS) ×3 IMPLANT
UNDERPAD 30X30 (UNDERPADS AND DIAPERS) ×6 IMPLANT
YANKAUER SUCT BULB TIP NO VENT (SUCTIONS) ×3 IMPLANT

## 2016-08-12 NOTE — Op Note (Signed)
Operative Note   DATE OF OPERATION: 6.8.10  LOCATION: [facility]out[facility]  SURGICAL DIVISION: Plastic Surgery  PREOPERATIVE DIAGNOSES:  1. Genetic predisposition to breast cancer 2. Acquired absence breasts  POSTOPERATIVE DIAGNOSES:  same  PROCEDURE:  1. Lipofilling to bilateral chest from flanks 2.Liposcution bilateral lateral chest wall 3. Revision right breast reconstruction with silicone implant exchange 4. Acellular dermis for right breast reconstruction 120 cm2  SURGEON: [name] MD MBA  ASSISTANT: none  ANESTHESIA:  General.   EBL: 20 ml  COMPLICATIONS: None immediate.   INDICATIONS FOR PROCEDURE:  The patient, Alejandra Harmon, is a 56 y.o. female born on 03/06/1961, is here for revision breast reconstruction. She has significant lateral and inferior displacement right breast implant and plan acellular dermis reinforcement   FINDINGS: Removed intact implant. New Inspira Extra Projection Smooth Round 700 ml implant placed, SRX-700, SN [id number]. Approximately 200 ml total fat aspirated from lateral chest wall bilateral. 60 ml fat infiltrated over right reconstruction, 20 ml fat over left reconstruction.  DESCRIPTION OF PROCEDURE:  The patient's operative site was marked with the patient in the preoperative area to mark sternal notch, desired anterior axillary line on left, and chest midlineThe patientwas taken to the operating room. SCDs were placed and IV antibiotics were given. The patient's operative site was prepped and draped in a sterile fashion. A time out was performed and all information was confirmed to be correct.  Stab incision made over bilateral lateral abdomen and tumescent fluid infiltrated over bilateral flanks,total 200 ml tumescent infiltrated. Power assisted liposuction performed to endpoint symmetric contour and soft tissue thickness. Total 200 cc lipoaspirate.The fat was then washed and prepared by gravity for infiltration. Harvested fat  was then infiltrated in subcutaneous and intramuscular planes throughout bilateral superior poles and right mastectomy flap.  Additional stab incision made in lateral extent right IMF scar and over left upper outer quadrant. Total 200 ml tumescent infiltrated over lateral chest wall. Power assisted liposuction performed, total lipoaspirate 200 ml.    Incision made in right inframammary fold scar, carried through superficial fascia and pectoralis muscle, and capsule incised. Implant removed intact. Alloderm Thick RTU was perforated and inset to chest all just medial to desired anterior axillary line with 2-0 PDS from axilla to inframammary fold. Right breast cavityirrigated with solution containing Ancef, gentamicin, and bacitracin, followed by Betadine. 15 Fr JP drain placed in cavity and secured with 2-0 nylon.Implant placed in right breast cavity. Care taken to ensure proper orientation. The anterior border of acellular dermis was then draped over implant and secured to anterior capsule and chest wall at inframammary fold with 2-0 PDS. Closure was completed with running 3-0 vicryl for approximation of superficial fascia. 4-0 vicryl was placed in dermis and running 4-0 monocryl was used to close skin.  Liposuction incisions closed with simple interrupted 3-0 monocryl.Tissue adhesive applied to breast incisions. Dry dressing and breast binder applied.   The patient was allowed to wake from anesthesia, extubated and taken to the recovery room in satisfactory condition.   SPECIMENS: none  DRAINS: 15 Fr JP in right breast reconstruction  [name], MD [facility] Plastic & Reconstructive Surgery [phone], pin [phone]

## 2016-08-12 NOTE — Anesthesia Preprocedure Evaluation (Signed)
Anesthesia Evaluation  Patient identified by MRN, date of birth, ID band Patient awake    Reviewed: Allergy & Precautions, NPO status , Patient's Chart, lab work & pertinent test results  Airway Mallampati: II  TM Distance: >3 FB Neck ROM: Full    Dental  (+) Teeth Intact, Dental Advisory Given   Pulmonary sleep apnea ,    Pulmonary exam normal breath sounds clear to auscultation       Cardiovascular hypertension, (-) angina(-) CAD, (-) Past MI and (-) CHF Normal cardiovascular exam Rhythm:Regular Rate:Normal     Neuro/Psych PSYCHIATRIC DISORDERS Anxiety Depression  Neuromuscular disease    GI/Hepatic Neg liver ROS, GERD  ,  Endo/Other  Obesity   Renal/GU negative Renal ROS     Musculoskeletal negative musculoskeletal ROS (+)   Abdominal   Peds  Hematology   Anesthesia Other Findings   Reproductive/Obstetrics                             Anesthesia Physical  Anesthesia Plan  ASA: II  Anesthesia Plan: General   Post-op Pain Management:    Induction: Intravenous  PONV Risk Score and Plan: 3 and Ondansetron, Dexamethasone, Propofol, Midazolam and Treatment may vary due to age  Airway Management Planned: Oral ETT and LMA  Additional Equipment:   Intra-op Plan:   Post-operative Plan: Extubation in OR  Informed Consent: I have reviewed the patients History and Physical, chart, labs and discussed the procedure including the risks, benefits and alternatives for the proposed anesthesia with the patient or authorized representative who has indicated his/her understanding and acceptance.   Dental advisory given  Plan Discussed with: CRNA  Anesthesia Plan Comments: (Risks/benefits of general anesthesia discussed with patient including risk of damage to teeth, lips, gum, and tongue, nausea/vomiting, allergic reactions to medications, and the possibility of heart attack, stroke and death.   All patient questions answered.  Patient wishes to proceed.)        Anesthesia Quick Evaluation

## 2016-08-12 NOTE — Transfer of Care (Signed)
Immediate Anesthesia Transfer of Care Note  Patient: Alejandra Harmon  Procedure(s) Performed: Procedure(s): LIPOFILLING FROM ABDOMEN TO CHEST, LIPOSUCTION BILATERAL LATERAL CHEST (Right) REVISION OF RIGHT BREAST RECONSTRUCTION WITH SILICONE IMPLANT EXCHANGE WITH ALLODERM TO CHEST (Right)  Patient Location: PACU  Anesthesia Type:General  Level of Consciousness: oriented, drowsy and patient cooperative  Airway & Oxygen Therapy: Patient Spontanous Breathing and Patient connected to face mask oxygen  Post-op Assessment: Report given to RN and Post -op Vital signs reviewed and stable  Post vital signs: Reviewed and stable  Last Vitals:  Vitals:   08/12/16 0627 08/12/16 0954  BP: 137/78 (!) 150/91  Pulse: 65   Resp: 18   Temp: 36.4 C     Last Pain:  Vitals:   08/12/16 0627  TempSrc: Oral      Patients Stated Pain Goal: 0 (07/15/00 1117)  Complications: No apparent anesthesia complications

## 2016-08-12 NOTE — Anesthesia Postprocedure Evaluation (Signed)
Anesthesia Post Note  Patient: Alejandra Harmon  Procedure(s) Performed: Procedure(s) (LRB): LIPOFILLING FROM ABDOMEN TO CHEST, LIPOSUCTION BILATERAL LATERAL CHEST (Right) REVISION OF RIGHT BREAST RECONSTRUCTION WITH SILICONE IMPLANT EXCHANGE WITH ALLODERM TO CHEST (Right)     Patient location during evaluation: PACU Anesthesia Type: General Level of consciousness: awake and alert Pain management: pain level controlled Vital Signs Assessment: post-procedure vital signs reviewed and stable Respiratory status: spontaneous breathing, nonlabored ventilation, respiratory function stable and patient connected to nasal cannula oxygen Cardiovascular status: blood pressure returned to baseline and stable Postop Assessment: no signs of nausea or vomiting Anesthetic complications: no    Last Vitals:  Vitals:   08/12/16 1100 08/12/16 1115  BP: (!) 142/88 126/76  Pulse: 65 (!) 53  Resp: 16 14  Temp:      Last Pain:  Vitals:   08/12/16 1115  TempSrc:   PainSc: 5                  Montez Hageman

## 2016-08-12 NOTE — Anesthesia Procedure Notes (Signed)
Procedure Name: Intubation Date/Time: 08/12/2016 7:34 AM Performed by: Genelle Bal Pre-anesthesia Checklist: Patient identified, Emergency Drugs available, Suction available, Patient being monitored and Timeout performed Patient Re-evaluated:Patient Re-evaluated prior to inductionOxygen Delivery Method: Circle system utilized Preoxygenation: Pre-oxygenation with 100% oxygen Intubation Type: IV induction Ventilation: Mask ventilation without difficulty Laryngoscope Size: Miller and 2 Grade View: Grade II Tube type: Oral Tube size: 7.0 mm Number of attempts: 1 Airway Equipment and Method: Stylet and Oral airway Placement Confirmation: ETT inserted through vocal cords under direct vision,  positive ETCO2 and breath sounds checked- equal and bilateral Secured at: 23 cm Tube secured with: Tape Dental Injury: Teeth and Oropharynx as per pre-operative assessment

## 2016-08-12 NOTE — Interval H&P Note (Signed)
History and Physical Interval Note:  08/12/2016 6:58 AM  Alejandra Harmon  has presented today for surgery, with the diagnosis of ACQUIRED ABSENCE OF BILATERAL BREASTS  The various methods of treatment have been discussed with the patient and family. After consideration of risks, benefits and other options for treatment, the patient has consented to  Procedure(s): LIPOFILLING FROM ABDOMEN TO CHEST, LIPOSUCTION BILATERAL LATERAL CHEST, (Right) REVISION OF RIGHT BREAST RECONSTRUCTION WITH SILICONE IMPLANT EXCHANGE WITH ALLODERM TO CHEST (Right) as a surgical intervention .  The patient's history has been reviewed, patient examined, no change in status, stable for surgery.  I have reviewed the patient's chart and labs.  Questions were answered to the patient's satisfaction.     Jamarien Rodkey

## 2016-08-12 NOTE — Discharge Instructions (Signed)
°Post Anesthesia Home Care Instructions ° °Activity: °Get plenty of rest for the remainder of the day. A responsible individual must stay with you for 24 hours following the procedure.  °For the next 24 hours, DO NOT: °-Drive a car °-Operate machinery °-Drink alcoholic beverages °-Take any medication unless instructed by your physician °-Make any legal decisions or sign important papers. ° °Meals: °Start with liquid foods such as gelatin or soup. Progress to regular foods as tolerated. Avoid greasy, spicy, heavy foods. If nausea and/or vomiting occur, drink only clear liquids until the nausea and/or vomiting subsides. Call your physician if vomiting continues. ° °Special Instructions/Symptoms: °Your throat may feel dry or sore from the anesthesia or the breathing tube placed in your throat during surgery. If this causes discomfort, gargle with warm salt water. The discomfort should disappear within 24 hours. ° °If you had a scopolamine patch placed behind your ear for the management of post- operative nausea and/or vomiting: ° °1. The medication in the patch is effective for 72 hours, after which it should be removed.  Wrap patch in a tissue and discard in the trash. Wash hands thoroughly with soap and water. °2. You may remove the patch earlier than 72 hours if you experience unpleasant side effects which may include dry mouth, dizziness or visual disturbances. °3. Avoid touching the patch. Wash your hands with soap and water after contact with the patch. °  °About my Jackson-Pratt Bulb Drain ° °What is a Jackson-Pratt bulb? °A Jackson-Pratt is a soft, round device used to collect drainage. It is connected to a long, thin drainage catheter, which is held in place by one or two small stiches near your surgical incision site. When the bulb is squeezed, it forms a vacuum, forcing the drainage to empty into the bulb. ° °Emptying the Jackson-Pratt bulb- °To empty the bulb: °1. Release the plug on the top of the  bulb. °2. Pour the bulb's contents into a measuring container which your nurse will provide. °3. Record the time emptied and amount of drainage. Empty the drain(s) as often as your     doctor or nurse recommends. ° °Date                  Time                    Amount (Drain 1)                 Amount (Drain 2) ° °_____________________________________________________________________ ° °_____________________________________________________________________ ° °_____________________________________________________________________ ° °_____________________________________________________________________ ° °_____________________________________________________________________ ° °_____________________________________________________________________ ° °_____________________________________________________________________ ° °_____________________________________________________________________ ° °Squeezing the Jackson-Pratt Bulb- °To squeeze the bulb: °1. Make sure the plug at the top of the bulb is open. °2. Squeeze the bulb tightly in your fist. You will hear air squeezing from the bulb. °3. Replace the plug while the bulb is squeezed. °4. Use a safety pin to attach the bulb to your clothing. This will keep the catheter from     pulling at the bulb insertion site. ° °When to call your doctor- °Call your doctor if: °· Drain site becomes red, swollen or hot. °· You have a fever greater than 101 degrees F. °· There is oozing at the drain site. °· Drain falls out (apply a guaze bandage over the drain hole and secure it with tape). °· Drainage increases daily not related to activity patterns. (You will usually have more drainage when you are active than when you are resting.) °· Drainage has a bad   odor.  About my Jackson-Pratt Bulb Drain  What is a Jackson-Pratt bulb? A Jackson-Pratt is a soft, round device used to collect drainage. It is connected to a long, thin drainage catheter, which is held in place by one or two small  stiches near your surgical incision site. When the bulb is squeezed, it forms a vacuum, forcing the drainage to empty into the bulb.  Emptying the Jackson-Pratt bulb- To empty the bulb: 1. Release the plug on the top of the bulb. 2. Pour the bulb's contents into a measuring container which your nurse will provide. 3. Record the time emptied and amount of drainage. Empty the drain(s) as often as your     doctor or nurse recommends.  Date                  Time                    Amount (Drain 1)                 Amount (Drain 2)  _____________________________________________________________________  _____________________________________________________________________  _____________________________________________________________________  _____________________________________________________________________  _____________________________________________________________________  _____________________________________________________________________  _____________________________________________________________________  _____________________________________________________________________  Squeezing the Jackson-Pratt Bulb- To squeeze the bulb: 1. Make sure the plug at the top of the bulb is open. 2. Squeeze the bulb tightly in your fist. You will hear air squeezing from the bulb. 3. Replace the plug while the bulb is squeezed. 4. Use a safety pin to attach the bulb to your clothing. This will keep the catheter from     pulling at the bulb insertion site.  When to call your doctor- Call your doctor if:  Drain site becomes red, swollen or hot.  You have a fever greater than 101 degrees F.  There is oozing at the drain site.  Drain falls out (apply a guaze bandage over the drain hole and secure it with tape).  Drainage increases daily not related to activity patterns. (You will usually have more drainage when you are active than when you are resting.)  Drainage has a bad  odor.

## 2016-08-13 ENCOUNTER — Encounter (HOSPITAL_BASED_OUTPATIENT_CLINIC_OR_DEPARTMENT_OTHER): Payer: Self-pay | Admitting: Plastic Surgery

## 2016-08-13 DIAGNOSIS — T8542XA Displacement of breast prosthesis and implant, initial encounter: Secondary | ICD-10-CM | POA: Diagnosis not present

## 2017-02-17 ENCOUNTER — Encounter (HOSPITAL_COMMUNITY): Payer: Self-pay

## 2017-04-26 IMAGING — CT CT ANGIO CHEST
2 of 8 series · 18 of 46 positions shown · IV contrast (isovue)
Comparison: Current chest radiographs

CLINICAL DATA: Chest pain and dyspnea in post-op setting. Hx
mastectomy.

EXAM:
CT ANGIOGRAPHY CHEST WITH CONTRAST
TECHNIQUE: Multidetector CT imaging of the chest was performed using the
standard protocol during bolus administration of intravenous
contrast. Multiplanar CT image reconstructions and MIPs were
obtained to evaluate the vascular anatomy.
CONTRAST:  60 mL of Isovue 370 intravenous contrast

[Series 5: thins · axial · 0.77mm/px · z∈[+1136,+1358]mm · 15 of 247 slices shown]
[im 12/247  lung]
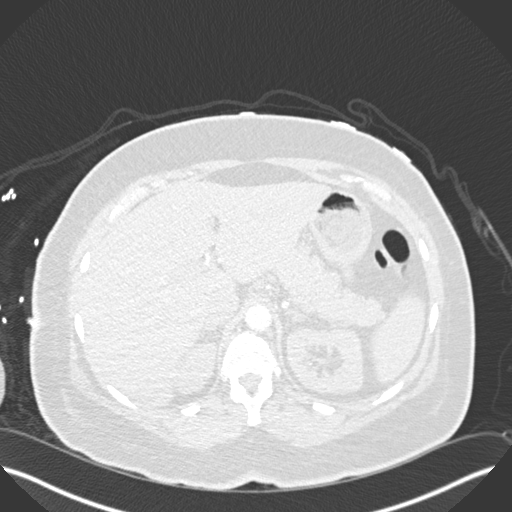
[im 34/247  soft-tissue]
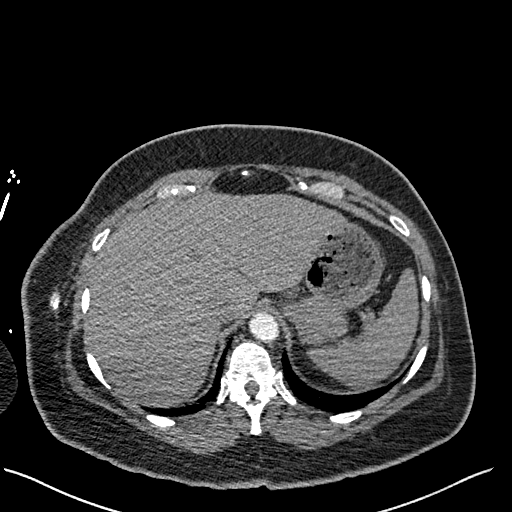
[im 45/247  lung]
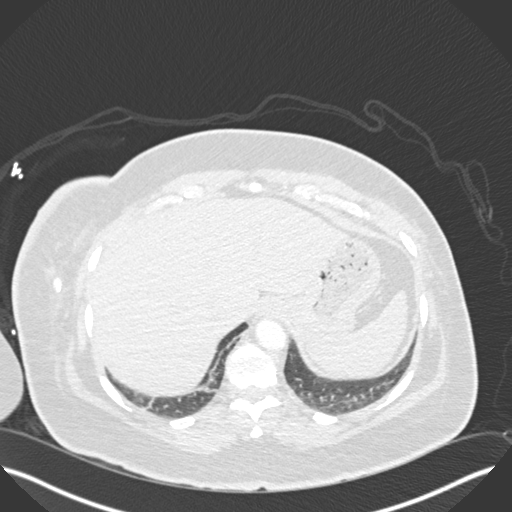
[im 56/247  soft-tissue]
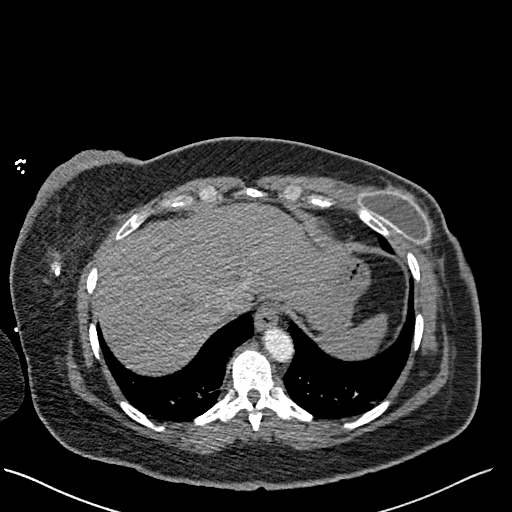
[im 79/247  lung]
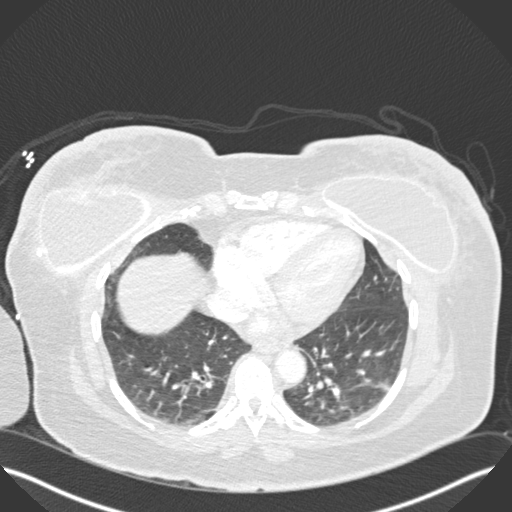
[im 90/247  soft-tissue]
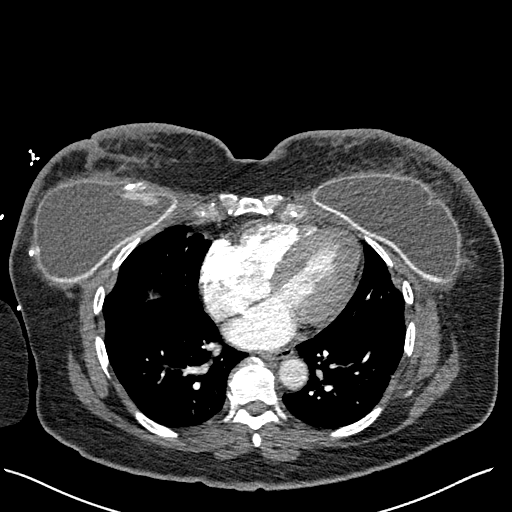
[im 112/247  lung]
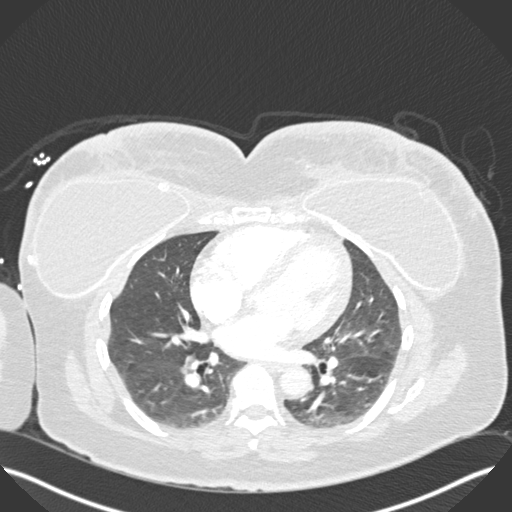
[im 124/247  soft-tissue]
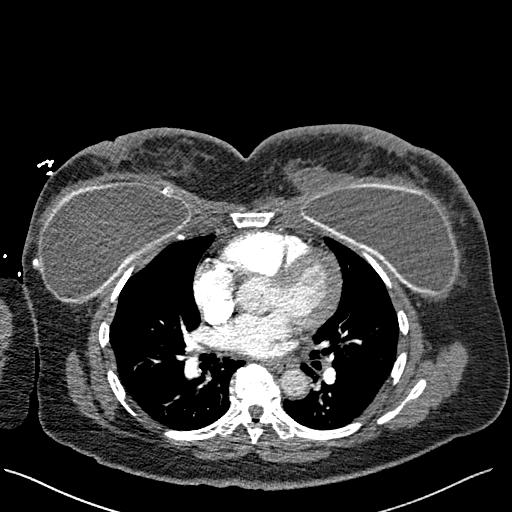
[im 135/247  lung]
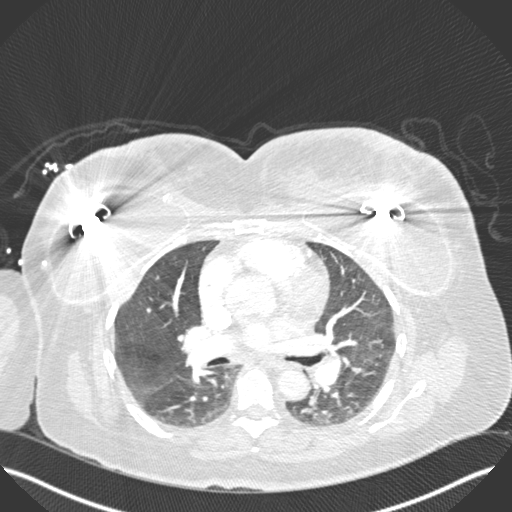
[im 157/247  soft-tissue]
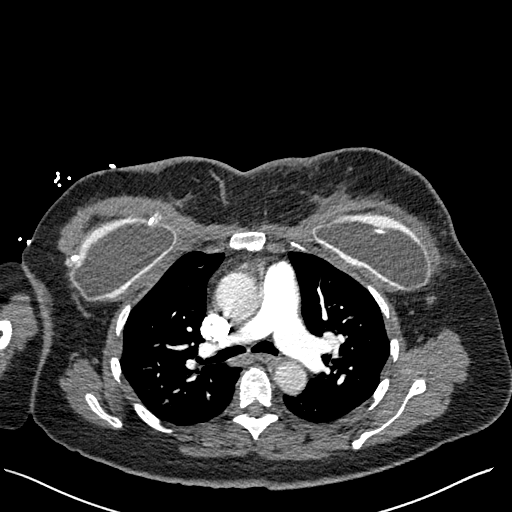
[im 168/247  lung]
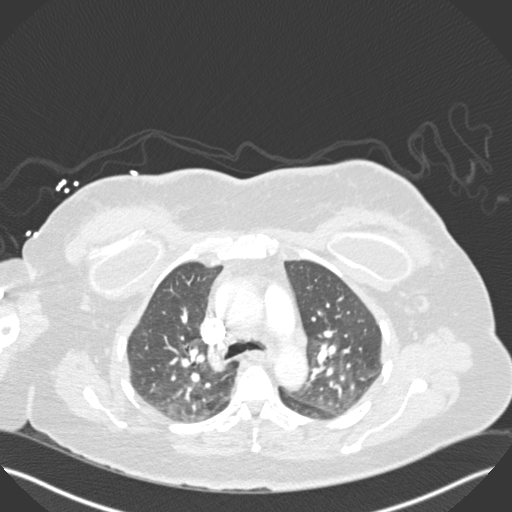
[im 191/247  soft-tissue]
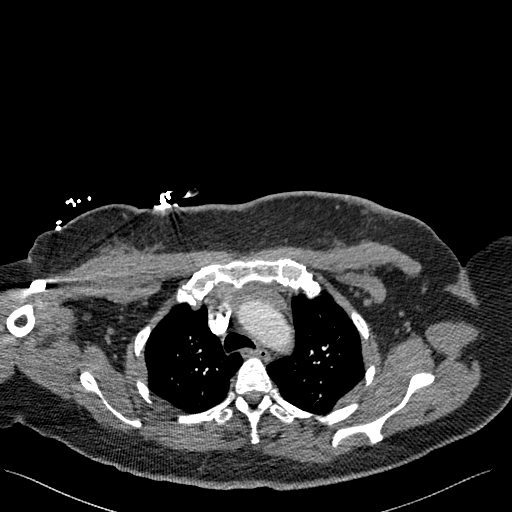
[im 202/247  lung]
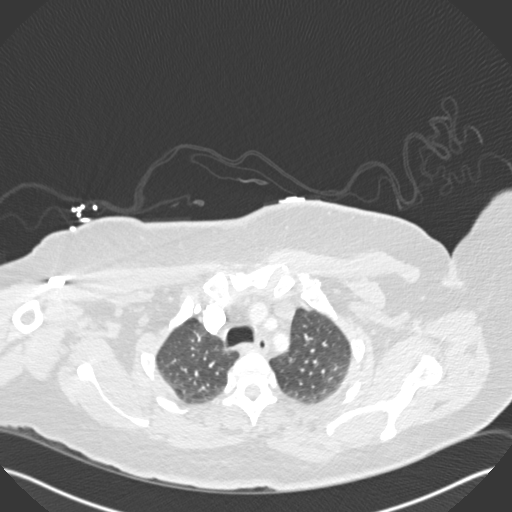
[im 213/247  soft-tissue]
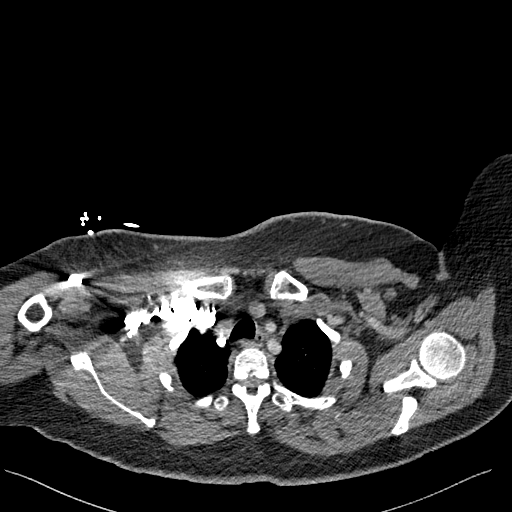
[im 235/247  lung]
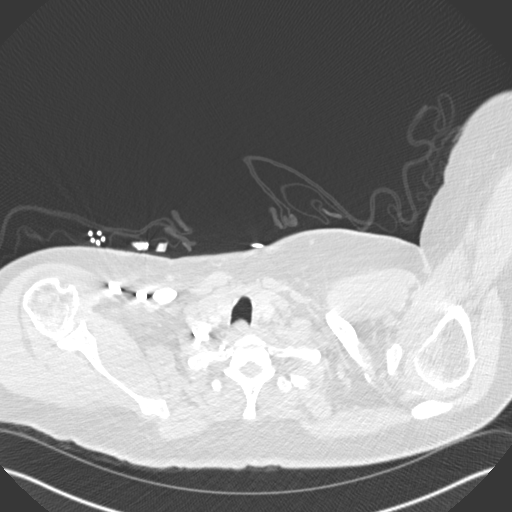

[Series 7: coronal mpr · coronal · 0.50mm/px · 3 of 129 slices shown]
[im 33/129  soft-tissue]
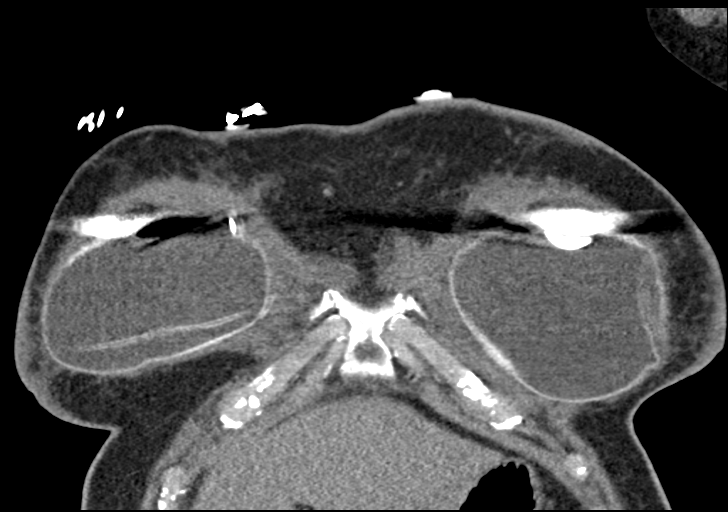
[im 65/129  soft-tissue]
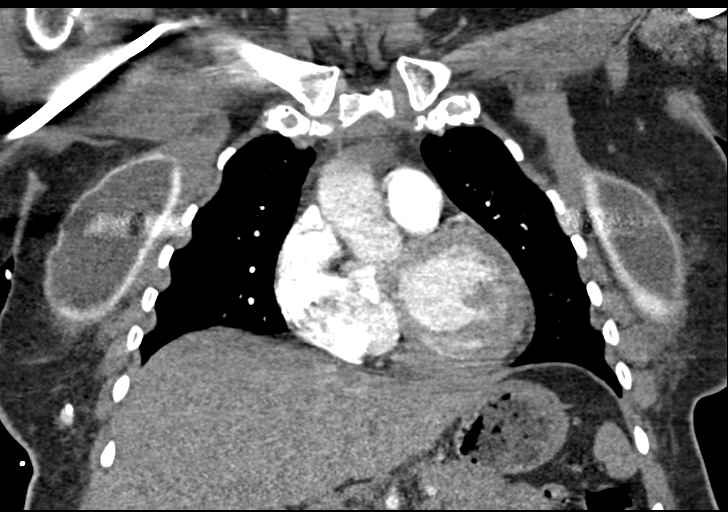
[im 97/129  soft-tissue]
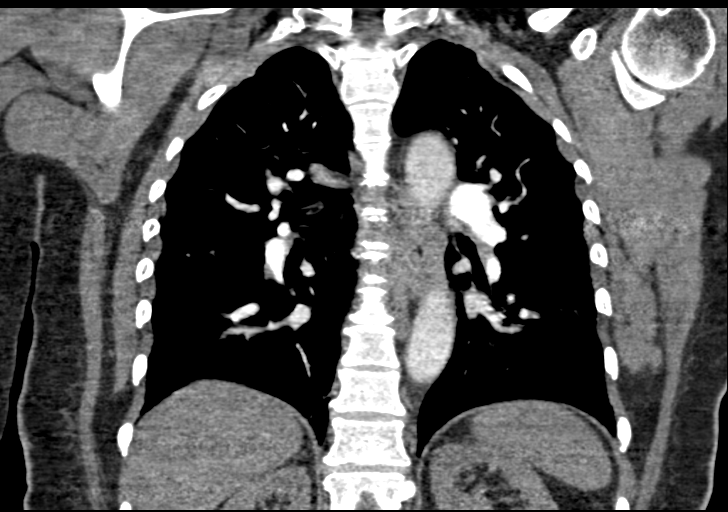

[18 of 46 positions shown; findings below may reference images not displayed]

FINDINGS: Cardiovascular: Satisfactory opacification of the pulmonary arteries
to the segmental level. No evidence of pulmonary embolism. Normal
heart size. No pericardial effusion. No coronary artery
calcifications. Great vessels are normal in caliber. No aortic
dissection or plaque.

Mediastinum/Nodes: No enlarged mediastinal, hilar, or axillary lymph
nodes. Thyroid gland, trachea, and esophagus demonstrate no
significant findings.

Lungs/Pleura: Minor dependent subsegmental atelectasis. No evidence
of pneumonia or pulmonary edema. No pleural effusion or
pneumothorax.

Upper Abdomen: No acute abnormality.

Soft tissues: Bilateral breast tissue expanders are noted. There is
overlying skin edema, findings consistent with recent bilateral
mastectomies.

Musculoskeletal: No acute findings. No osteoblastic or osteolytic
lesions.

Review of the MIP images confirms the above findings.
IMPRESSION: 1. No evidence of a pulmonary embolus.
2. No acute findings.

## 2018-12-28 ENCOUNTER — Encounter (INDEPENDENT_AMBULATORY_CARE_PROVIDER_SITE_OTHER): Payer: Self-pay

## 2019-05-18 ENCOUNTER — Ambulatory Visit: Payer: BLUE CROSS/BLUE SHIELD | Attending: Internal Medicine

## 2019-05-18 DIAGNOSIS — Z23 Encounter for immunization: Secondary | ICD-10-CM

## 2019-05-18 NOTE — Progress Notes (Signed)
   Covid-19 Vaccination Clinic  Name:  Alejandra Harmon    MRN: [id number] DOB: [birth year]  05/18/2019  [name] was observed post Covid-19 immunization for 15 minutes without incident. She was provided with Vaccine Information Sheet and instruction to access the V-Safe system.   [name] was instructed to call 911 with any severe reactions post vaccine: Marland Kitchen Difficulty breathing  . Swelling of face and throat  . A fast heartbeat  . A bad rash all over body  . Dizziness and weakness   Immunizations Administered    Name Date Dose VIS Date Route   Pfizer COVID-19 Vaccine 05/18/2019  9:00 AM 0.3 mL 02/15/2019 Intramuscular   Manufacturer: [place]   Lot: [id number]   [place]: [id number]

## 2019-06-10 ENCOUNTER — Ambulatory Visit: Payer: BLUE CROSS/BLUE SHIELD

## 2019-06-10 ENCOUNTER — Ambulatory Visit: Payer: BLUE CROSS/BLUE SHIELD | Attending: Internal Medicine

## 2019-06-10 DIAGNOSIS — Z23 Encounter for immunization: Secondary | ICD-10-CM

## 2019-06-10 NOTE — Progress Notes (Signed)
   Covid-19 Vaccination Clinic  Name:  Alejandra Harmon    MRN: [id number] DOB: Sep 01, 1960  06/10/2019  [name] was observed post Covid-19 immunization for 15 minutes without incident. She was provided with Vaccine Information Sheet and instruction to access the V-Safe system.   [name] was instructed to call 911 with any severe reactions post vaccine: Marland Kitchen Difficulty breathing  . Swelling of face and throat  . A fast heartbeat  . A bad rash all over body  . Dizziness and weakness   Immunizations Administered    Name Date Dose VIS Date Route   Pfizer COVID-19 Vaccine 06/10/2019  5:33 PM 0.3 mL 02/15/2019 Intramuscular   Manufacturer: [place]   Lot: [id number]   [place]: [id number]

## 2021-09-02 ENCOUNTER — Emergency Department (HOSPITAL_COMMUNITY): Payer: Self-pay

## 2021-09-02 ENCOUNTER — Emergency Department (HOSPITAL_COMMUNITY)
Admission: EM | Admit: 2021-09-02 | Discharge: 2021-09-02 | Disposition: A | Discharge status: Home / Self Care | Payer: Self-pay | Attending: Emergency Medicine | Admitting: Emergency Medicine

## 2021-09-02 DIAGNOSIS — R29810 Facial weakness: Secondary | ICD-10-CM | POA: Insufficient documentation

## 2021-09-02 DIAGNOSIS — R519 Headache, unspecified: Secondary | ICD-10-CM | POA: Insufficient documentation

## 2021-09-02 DIAGNOSIS — I10 Essential (primary) hypertension: Secondary | ICD-10-CM | POA: Insufficient documentation

## 2021-09-02 DIAGNOSIS — R531 Weakness: Secondary | ICD-10-CM | POA: Insufficient documentation

## 2021-09-02 DIAGNOSIS — E119 Type 2 diabetes mellitus without complications: Secondary | ICD-10-CM | POA: Insufficient documentation

## 2021-09-02 DIAGNOSIS — R202 Paresthesia of skin: Secondary | ICD-10-CM | POA: Insufficient documentation

## 2021-09-02 LAB — COMPREHENSIVE METABOLIC PANEL
ALT: 21 U/L (ref 0–44)
AST: 17 U/L (ref 15–41)
Albumin: 4 g/dL (ref 3.5–5.0)
Alkaline Phosphatase: 82 U/L (ref 38–126)
Anion gap: 11 (ref 5–15)
BUN: 10 mg/dL (ref 6–20)
CO2: 26 mmol/L (ref 22–32)
Calcium: 9.7 mg/dL (ref 8.9–10.3)
Chloride: 102 mmol/L (ref 98–111)
Creatinine, Ser: 0.94 mg/dL (ref 0.44–1.00)
GFR, Estimated: >60 mL/min (ref >60)
Glucose, Bld: 176 mg/dL — ABNORMAL HIGH (ref 70–99)
Potassium: 3.7 mmol/L (ref 3.5–5.1)
Sodium: 139 mmol/L (ref 135–145)
Total Bilirubin: 0.4 mg/dL (ref 0.3–1.2)
Total Protein: 6.6 g/dL (ref 6.5–8.1)

## 2021-09-02 LAB — CBC
HCT: 41.7 % (ref 36.0–46.0)
Hemoglobin: 13.6 g/dL (ref 12.0–15.0)
MCH: 27.8 pg (ref 26.0–34.0)
MCHC: 32.6 g/dL (ref 30.0–36.0)
MCV: 85.3 fL (ref 80.0–100.0)
Platelets: 307 10*3/uL (ref 150–400)
RBC: 4.89 MIL/uL (ref 3.87–5.11)
RDW: 13.4 % (ref 11.5–15.5)
WBC: 7.5 10*3/uL (ref 4.0–10.5)
nRBC: 0 % (ref 0.0–0.2)

## 2021-09-02 LAB — DIFFERENTIAL
Abs Immature Granulocytes: 0.03 10*3/uL (ref 0.00–0.07)
Basophils Absolute: 0 10*3/uL (ref 0.0–0.1)
Basophils Relative: 0 %
Eosinophils Absolute: 0.1 10*3/uL (ref 0.0–0.5)
Eosinophils Relative: 2 %
Immature Granulocytes: 0 %
Lymphocytes Relative: 29 %
Lymphs Abs: 2.2 10*3/uL (ref 0.7–4.0)
Monocytes Absolute: 0.5 10*3/uL (ref 0.1–1.0)
Monocytes Relative: 6 %
Neutro Abs: 4.7 10*3/uL (ref 1.7–7.7)
Neutrophils Relative %: 63 %

## 2021-09-02 LAB — APTT: aPTT: 33 seconds (ref 24–36)

## 2021-09-02 LAB — PROTIME-INR
INR: 1 (ref 0.8–1.2)
Prothrombin Time: 12.9 seconds (ref 11.4–15.2)

## 2021-09-02 LAB — ETHANOL: Alcohol, Ethyl (B): <10 mg/dL (ref <10)

## 2021-09-02 LAB — CBG MONITORING, ED
Glucose-Capillary: 175 mg/dL — ABNORMAL HIGH (ref 70–99)
Glucose-Capillary: 167 mg/dL — ABNORMAL HIGH (ref 70–99)

## 2021-09-02 MED ORDER — SODIUM CHLORIDE 0.9% FLUSH: 3.0000 mL | Freq: Once | INTRAVENOUS | Status: DC

## 2021-09-02 NOTE — ED Provider Triage Note (Signed)
Emergency Medicine Provider Triage Evaluation Note  MELISSAANN DIZDAREVIC , a 61 y.o. female  was evaluated in triage.  Pt complains of left-sided headache followed by left-sided facial droop and left sided arm weakness.  Patient states the symptoms only lasted for a few minutes.  She has continued to have a headache.  Currently no unilateral symptoms.  No neurologic deficits.  Currently the only active complaint is left-sided headache.  Onset of symptoms was at 1548 with resolution at approximately 1608.  Review of Systems  Positive: Headache Negative: Chest pain, shortness of breath, visual disturbances  Physical Exam  BP (!) 118/93 (BP Location: Left Arm)   Pulse 75   Temp 98.8 F (37.1 C) (Oral)   Resp 14   LMP 02/10/2015 (Exact Date)   SpO2 98%  Gen:   Awake, no distress   Resp:  Normal effort  MSK:   Moves extremities without difficulty  Other:    Medical Decision Making  Medically screening exam initiated at 5:01 PM.  Appropriate orders placed.  Lenor Derrick Rosenberger was informed that the remainder of the evaluation will be completed by another provider, this initial triage assessment does not replace that evaluation, and the importance of remaining in the ED until their evaluation is complete.     Dorothyann Peng, PA-C 09/02/21 1703

## 2021-09-02 NOTE — Discharge Instructions (Signed)
Your blood work and MRI scan did not show signs of a stroke or mini stroke.  It is possible that you are having a complex migraine, or a Bell's palsy, or another condition that is causing your symptoms.  I recommended follow-up with a neurologist as an outpatient.  If your symptoms return or persist, including severe headache, loss of vision, facial droop, difficulty speaking, or numbness or weakness of your arms or legs, you should return to the ER.

## 2021-09-02 NOTE — ED Provider Notes (Signed)
[facility] EMERGENCY DEPARTMENT Provider Note   CSN: [id number] Arrival date & time: 09/02/21  1648     History  Chief Complaint  Patient presents with   Transient Ischemic Attack    Alejandra Harmon is a 61 y.o. female present emergency department with complaint of left-sided facial paresthesia and facial droop.  She reports onset of symptoms earlier this afternoon around 3 PM.  The symptoms were completely resolved.  She reports that she felt that she had a left-sided temporal headache, and also noted her left eye was drooping and difficult time keeping it open, and she felt her speech was off and the left side of her face was also drooping.  She reports she had weakness in her left leg at the time, that felt like "a brick".  She does suffer from sciatica and says that occasionally she has difficulties her leg locking up or giving out on her in the past.  She denies history of TIA or stroke.  She does report a history of hypertension and diabetes and possible high cholesterol.  She denies smoking history.  HPI     Home Medications Prior to Admission medications   Medication Sig Start Date End Date Taking? Authorizing Provider  fexofenadine (ALLEGRA) 180 MG tablet Take 180 mg by mouth daily.    [provider]  omeprazole (PRILOSEC) 20 MG capsule Take 20 mg by mouth daily.    [provider]  polyethylene glycol (MIRALAX / GLYCOLAX) packet Take 17 g by mouth daily.    [provider]      Allergies    Apple juice, Soy allergy, Milk-related compounds, and Bactrim [sulfamethoxazole-trimethoprim]    Review of Systems   Review of Systems  Physical Exam Updated Vital Signs BP 124/71 (BP Location: Left Arm)   Pulse (!) 59   Temp 97.8 F (36.6 C) (Oral)   Resp 16   LMP 02/10/2015 (Exact Date)   SpO2 96%  Physical Exam Constitutional:      General: She is not in acute distress. HENT:     Head: Normocephalic and atraumatic.  Eyes:      Conjunctiva/sclera: Conjunctivae normal.     Pupils: Pupils are equal, round, and reactive to light.  Cardiovascular:     Rate and Rhythm: Normal rate and regular rhythm.  Pulmonary:     Effort: Pulmonary effort is normal. No respiratory distress.  Abdominal:     General: There is no distension.     Tenderness: There is no abdominal tenderness.  Skin:    General: Skin is warm and dry.  Neurological:     General: No focal deficit present.     Mental Status: She is alert and oriented to person, place, and time. Mental status is at baseline.     Cranial Nerves: No cranial nerve deficit.     Sensory: No sensory deficit.     Motor: No weakness.     Coordination: Coordination normal.  Psychiatric:        Mood and Affect: Mood normal.        Behavior: Behavior normal.     ED Results / Procedures / Treatments   Labs (all labs ordered are listed, but only abnormal results are displayed) Labs Reviewed  COMPREHENSIVE METABOLIC PANEL - Abnormal; Notable for the following components:      Result Value   Glucose, Bld 176 (*)    All other components within normal limits  CBG MONITORING, ED - Abnormal; Notable for  the following components:   Glucose-Capillary 175 (*)    All other components within normal limits  CBG MONITORING, ED - Abnormal; Notable for the following components:   Glucose-Capillary 167 (*)    All other components within normal limits  PROTIME-INR  APTT  CBC  DIFFERENTIAL  ETHANOL  I-STAT CHEM 8, ED    EKG EKG Interpretation  Date/Time:  Thursday September 02 2021 16:52:50 EDT Ventricular Rate:  63 PR Interval:  138 QRS Duration: 86 QT Interval:  394 QTC Calculation: 403 R Axis:   41 Text Interpretation: Normal sinus rhythm Normal ECG When compared with ECG of 06-Feb-2016 19:40, PREVIOUS ECG IS PRESENT Confirmed by Octaviano Glow [phone]) on 09/02/2021 7:31:20 PM  Radiology MR BRAIN WO CONTRAST  Result Date: 09/02/2021 CLINICAL DATA:  Initial evaluation  for acute TIA, left facial droop. EXAM: MRI HEAD WITHOUT CONTRAST TECHNIQUE: Multiplanar, multiecho pulse sequences of the brain and surrounding structures were obtained without intravenous contrast. COMPARISON:  CT from earlier the same day. FINDINGS: Brain: Cerebral volume within normal limits. No significant cerebral white matter disease for age. No evidence for acute or subacute infarct. Gray-white matter differentiation maintained. No areas of chronic cortical infarction. No acute or chronic intracranial blood products. No mass lesion, midline shift or mass effect no hydrocephalus or extra-axial fluid collection. Pituitary gland suprasellar region normal. Vascular: Major intracranial vascular flow voids are maintained. Skull and upper cervical spine: Craniocervical junction within normal limits. Bone marrow signal intensity normal. No scalp soft tissue abnormality. Sinuses/Orbits: Globes orbital soft tissues within normal limits. Mild scattered mucosal thickening noted about the ethmoidal air cells. No mastoid effusion. Other: None. IMPRESSION: Normal brain MRI for age. No acute intracranial abnormality identified. Electronically Signed   By: Jeannine Boga M.D.   On: 09/02/2021 21:11   CT HEAD WO CONTRAST  Result Date: 09/02/2021 CLINICAL DATA:  TIA. Left-sided headache and facial droop. Left arm weakness EXAM: CT HEAD WITHOUT CONTRAST TECHNIQUE: Contiguous axial images were obtained from the base of the skull through the vertex without intravenous contrast. RADIATION DOSE REDUCTION: This exam was performed according to the departmental dose-optimization program which includes automated exposure control, adjustment of the mA and/or kV according to patient size and/or use of iterative reconstruction technique. COMPARISON:  CT head 01/13/2011 FINDINGS: Brain: No evidence of acute infarction, hemorrhage, hydrocephalus, extra-axial collection or mass lesion/mass effect. Physiologic calcification in the  globus pallidus bilaterally. Vascular: Negative for hyperdense vessel Skull: Negative Sinuses/Orbits: Negative Other: None IMPRESSION: Negative CT head Electronically Signed   By: Franchot Gallo M.D.   On: 09/02/2021 18:51    Procedures Procedures    Medications Ordered in ED Medications  sodium chloride flush (NS) 0.9 % injection 3 mL (has no administration in time range)    ED Course/ Medical Decision Making/ A&P Clinical Course as of 09/02/21 2218  Thu Sep 02, 2021  2152 Patient reassessed and remains asymptomatic.  Good strength in the lower extremities.  She ready has PT set up for her chronic lower extremity weakness and leg weakness, and has orthopedic follow-up for sciatic and hip problems.  They requested a neurology referral, which I think is reasonable, as I am not certain whether or not this may be a complex migraine.  Referral placed [MT]    Clinical Course User Index [MT] Rodolfo Notaro, Carola Rhine, MD                           Medical Decision  Making Amount and/or Complexity of Data Reviewed Labs: ordered. Radiology: ordered.   This patient presents to the ED with concern for left-sided facial droop and weakness, possible left leg weakness. This involves an extensive number of treatment options, and is a complaint that carries with it a high risk of complications and morbidity.  The differential diagnosis includes CVA versus TIA versus Bell's palsy (with chronic known sciatica complication of the lower leg)  Co-morbidities that complicate the patient evaluation: Several risk factors stroke including high blood pressure, high cholesterol, diabetes  I ordered and personally interpreted labs.  The pertinent results include: Labs unremarkable  I ordered imaging studies including CT scan of the head, MRI of the brain I independently visualized and interpreted imaging which showed no acute infarct I agree with the radiologist interpretation  The patient was maintained on a cardiac  monitor.  I personally viewed and interpreted the cardiac monitored which showed an underlying rhythm of: Normal sinus rhythm  Per my interpretation the patient's ECG shows normal sinus rhythm with no acute ischemic findings  After the interventions noted above, I reevaluated the patient and found that they have: improved  Asymptomatic in the ED.  Strength is normal in the extremities.  No facial droop.  Dispostion:  After consideration of the diagnostic results and the patients response to treatment, I feel that the patent would benefit from outpatient follow-up.         Final Clinical Impression(s) / ED Diagnoses Final diagnoses:  Facial droop    Rx / DC Orders ED Discharge Orders          Ordered    Ambulatory referral to Neurology       Comments: An appointment is requested in approximately: 2 weeks Left facial droop - possible complex migraine   09/02/21 2153              Wyvonnia Dusky, MD 09/02/21 2218

## 2021-09-02 NOTE — ED Triage Notes (Signed)
Patient BIB [facility] from work for evaluation of sudden onset of left sided headache, left sided weakness, left sided facial droop, left sided arm and leg weakness at 1548 and resolved at approximately 1608. Patient complains of pressure in the left side of her head but other symptoms have resolved. Patient is alert, oriented, and in no apparent distress at this time.

## 2021-09-02 NOTE — ED Notes (Signed)
Patient transported to MRI 

## 2021-09-02 NOTE — ED Notes (Signed)
Discharge instructions reviewed with patient. Patient verbalized understanding of instructions. Follow-up care and medications were reviewed. Patient ambulatory with steady gait. VSS upon discharge.  ?

## 2022-08-27 LAB — AMB RESULTS CONSOLE CBG: Glucose: 252

## 2022-09-21 ENCOUNTER — Encounter: Payer: Self-pay | Admitting: *Deleted

## 2022-09-21 NOTE — Progress Notes (Signed)
Pt attended 08/27/2022 event where her b/p was 102/67 and her blood sugar was 252. At the event, the pt noted that "T Earl Gala" was her PCP and that she did not have any SDOH insecurities. Chart review indicates pt has been seeing Sherrie Mustache, PA-C at [facility] since 2020. Her last in-person office visit with her PCP was 02/22/22, and pt saw another provider in her PCP office on 05/20/22 where her A1C was 7.3. At her nephrology visit on 08/23/22, pt's blood sugar was 249. Pt also has a future appt with Merleen Milliner, PA-C on 9/20.24. No additional health equity team support indicated at this time.

## 2022-11-03 ENCOUNTER — Ambulatory Visit: Payer: Self-pay

## 2022-11-03 NOTE — Telephone Encounter (Signed)
Chief Complaint: Neck pain Symptoms: neck pain 10/10, neck stiffness, headache in the back of the head Frequency: constant Pertinent Negatives: Patient denies SOB, Blurred vision, weakness Disposition: [x] ED /[] Urgent Care (no appt availability in office) / [] Appointment(In office/virtual)/ []  [facility]/ [] Home Care/ [] Refused Recommended Disposition /[] [place]/ []  Follow-up with PCP Additional Notes: Patient states she began having neck pain about 4 weeks ago but has gradually gotten worse. Patient reports neck pain is currently a 10/10 on pain scale and patient is not able to touch her chin to her chest.  Patient reports the neck pain radiates up to the back of her head causing a headache as well. Patient has tried placing a lidocaine patch on her neck without any improvement in pain. Care advice given and recommended patient go to the ED for evaluation. Patient is agreeable and will call her son to drive her.  Reason for Disposition  [1] Stiff neck (can't put chin to chest) AND [2] headache  Answer Assessment - Initial Assessment Questions 1. ONSET: "When did the pain begin?"      4 weeks ago, gradually gotten worse  2. LOCATION: "Where does it hurt?"      The back of my neck 3. PATTERN "Does the pain come and go, or has it been constant since it started?"      constant 4. SEVERITY: "How bad is the pain?"  (Scale 1-10; or mild, moderate, severe)   - NO PAIN (0): no pain or only slight stiffness    - MILD (1-3): doesn't interfere with normal activities    - MODERATE (4-7): interferes with normal activities or awakens from sleep    - SEVERE (8-10):  excruciating pain, unable to do any normal activities      10/10 5. RADIATION: "Does the pain go anywhere else, shoot into your arms?"     Yes, to the back of the head 6. CORD SYMPTOMS: "Any weakness or numbness of the arms or legs?"     No  7. CAUSE: "What do you think is causing the neck pain?"     I don't maybe  from sleeping wrong  8. NECK OVERUSE: "Any recent activities that involved turning or twisting the neck?"     No  9. OTHER SYMPTOMS: "Do you have any other symptoms?" (e.g., headache, fever, chest pain, difficulty breathing, neck swelling)     Headache  Protocols used: Neck Pain or Stiffness-A-AH
